# Patient Record
Sex: Male | Born: 1955
Health system: Southern US, Community
[De-identification: ages and names within clinical notes are randomized; demographics above are authoritative.]

## PROBLEM LIST (undated history)

## (undated) ENCOUNTER — Inpatient Hospital Stay (HOSPITAL_COMMUNITY): Payer: Self-pay | Admitting: Obstetrics & Gynecology

## (undated) DIAGNOSIS — I10 Essential (primary) hypertension: Secondary | ICD-10-CM

## (undated) DIAGNOSIS — F0390 Unspecified dementia without behavioral disturbance: Secondary | ICD-10-CM

## (undated) HISTORY — PX: CHOLECYSTECTOMY: SHX55

## (undated) HISTORY — PX: FRACTURE SURGERY: SHX138

---

## 2002-11-09 ENCOUNTER — Encounter: Admission: RE | Admit: 2002-11-09 | Discharge: 2002-11-09 | Payer: Self-pay | Admitting: Family Medicine

## 2002-11-09 ENCOUNTER — Encounter: Payer: Self-pay | Admitting: Family Medicine

## 2005-06-16 DIAGNOSIS — I10 Essential (primary) hypertension: Secondary | ICD-10-CM

## 2005-06-16 HISTORY — DX: Essential (primary) hypertension: I10

## 2009-06-16 HISTORY — PX: OTHER SURGICAL HISTORY: SHX169

## 2010-04-10 ENCOUNTER — Encounter
Admission: RE | Admit: 2010-04-10 | Discharge: 2010-06-04 | Payer: Self-pay | Source: Home / Self Care | Attending: Orthopedic Surgery | Admitting: Orthopedic Surgery

## 2010-04-27 ENCOUNTER — Emergency Department (HOSPITAL_COMMUNITY): Admission: EM | Admit: 2010-04-27 | Discharge: 2010-04-27 | Payer: Self-pay | Admitting: Emergency Medicine

## 2010-06-27 ENCOUNTER — Encounter
Admission: RE | Admit: 2010-06-27 | Discharge: 2010-07-16 | Payer: Self-pay | Source: Home / Self Care | Attending: Orthopedic Surgery | Admitting: Orthopedic Surgery

## 2010-07-17 ENCOUNTER — Ambulatory Visit: Payer: Self-pay | Attending: Orthopedic Surgery | Admitting: Physical Therapy

## 2010-07-17 DIAGNOSIS — IMO0001 Reserved for inherently not codable concepts without codable children: Secondary | ICD-10-CM | POA: Insufficient documentation

## 2010-07-17 DIAGNOSIS — R262 Difficulty in walking, not elsewhere classified: Secondary | ICD-10-CM | POA: Insufficient documentation

## 2010-07-17 DIAGNOSIS — M2550 Pain in unspecified joint: Secondary | ICD-10-CM | POA: Insufficient documentation

## 2010-07-17 DIAGNOSIS — M6281 Muscle weakness (generalized): Secondary | ICD-10-CM | POA: Insufficient documentation

## 2010-07-17 DIAGNOSIS — M2560 Stiffness of unspecified joint, not elsewhere classified: Secondary | ICD-10-CM | POA: Insufficient documentation

## 2010-07-18 ENCOUNTER — Ambulatory Visit: Payer: Self-pay | Admitting: Physical Therapy

## 2010-07-24 ENCOUNTER — Ambulatory Visit: Payer: Self-pay | Admitting: Physical Therapy

## 2010-07-25 ENCOUNTER — Ambulatory Visit: Payer: Self-pay | Admitting: Physical Therapy

## 2010-07-29 ENCOUNTER — Encounter: Payer: Self-pay | Admitting: Physical Therapy

## 2010-07-31 ENCOUNTER — Ambulatory Visit: Payer: Self-pay | Admitting: Physical Therapy

## 2010-08-05 ENCOUNTER — Ambulatory Visit: Payer: Self-pay | Admitting: Physical Therapy

## 2010-08-08 ENCOUNTER — Ambulatory Visit: Payer: Self-pay | Admitting: Physical Therapy

## 2010-08-14 ENCOUNTER — Ambulatory Visit: Payer: Self-pay | Admitting: Physical Therapy

## 2010-08-16 ENCOUNTER — Ambulatory Visit: Payer: Self-pay | Attending: Orthopedic Surgery | Admitting: Physical Therapy

## 2010-08-16 DIAGNOSIS — M6281 Muscle weakness (generalized): Secondary | ICD-10-CM | POA: Insufficient documentation

## 2010-08-16 DIAGNOSIS — M2550 Pain in unspecified joint: Secondary | ICD-10-CM | POA: Insufficient documentation

## 2010-08-16 DIAGNOSIS — R262 Difficulty in walking, not elsewhere classified: Secondary | ICD-10-CM | POA: Insufficient documentation

## 2010-08-16 DIAGNOSIS — IMO0001 Reserved for inherently not codable concepts without codable children: Secondary | ICD-10-CM | POA: Insufficient documentation

## 2010-08-16 DIAGNOSIS — M2560 Stiffness of unspecified joint, not elsewhere classified: Secondary | ICD-10-CM | POA: Insufficient documentation

## 2010-08-19 ENCOUNTER — Encounter: Payer: Self-pay | Admitting: Physical Therapy

## 2010-08-21 ENCOUNTER — Encounter: Payer: Self-pay | Admitting: Physical Therapy

## 2010-08-27 ENCOUNTER — Ambulatory Visit: Payer: Self-pay | Admitting: Physical Therapy

## 2010-08-27 LAB — DIFFERENTIAL
Lymphocytes Relative: 7 % — ABNORMAL LOW (ref 12–46)
Lymphs Abs: 1.2 10*3/uL (ref 0.7–4.0)
Monocytes Relative: 8 % (ref 3–12)
Neutro Abs: 14.2 10*3/uL — ABNORMAL HIGH (ref 1.7–7.7)
Neutrophils Relative %: 85 % — ABNORMAL HIGH (ref 43–77)

## 2010-08-27 LAB — BASIC METABOLIC PANEL
Calcium: 9.8 mg/dL (ref 8.4–10.5)
GFR calc Af Amer: >60 mL/min (ref >60)
GFR calc non Af Amer: >60 mL/min (ref >60)
Potassium: 4.3 mEq/L (ref 3.5–5.1)
Sodium: 137 mEq/L (ref 135–145)

## 2010-08-27 LAB — CBC
HCT: 38.7 % — ABNORMAL LOW (ref 39.0–52.0)
Hemoglobin: 12.9 g/dL — ABNORMAL LOW (ref 13.0–17.0)
RBC: 4.47 MIL/uL (ref 4.22–5.81)

## 2010-09-03 ENCOUNTER — Ambulatory Visit: Payer: Self-pay | Admitting: Physical Therapy

## 2010-09-10 ENCOUNTER — Ambulatory Visit: Payer: Self-pay | Admitting: Physical Therapy

## 2010-09-12 ENCOUNTER — Encounter: Payer: Self-pay | Admitting: Physical Therapy

## 2010-09-16 ENCOUNTER — Ambulatory Visit: Payer: Self-pay | Attending: Orthopedic Surgery | Admitting: Physical Therapy

## 2010-09-16 DIAGNOSIS — M2550 Pain in unspecified joint: Secondary | ICD-10-CM | POA: Insufficient documentation

## 2010-09-16 DIAGNOSIS — R262 Difficulty in walking, not elsewhere classified: Secondary | ICD-10-CM | POA: Insufficient documentation

## 2010-09-16 DIAGNOSIS — M2560 Stiffness of unspecified joint, not elsewhere classified: Secondary | ICD-10-CM | POA: Insufficient documentation

## 2010-09-16 DIAGNOSIS — IMO0001 Reserved for inherently not codable concepts without codable children: Secondary | ICD-10-CM | POA: Insufficient documentation

## 2010-09-16 DIAGNOSIS — M6281 Muscle weakness (generalized): Secondary | ICD-10-CM | POA: Insufficient documentation

## 2010-09-18 ENCOUNTER — Ambulatory Visit: Payer: Self-pay | Admitting: Physical Therapy

## 2011-01-09 MED ORDER — KETOROLAC TROMETHAMINE 30 MG/ML IJ SOLN
INTRAMUSCULAR | Status: AC
  Filled 2011-01-09: qty 1

## 2011-01-13 MED ORDER — MUPIROCIN 2 % EX OINT
TOPICAL_OINTMENT | CUTANEOUS | Status: AC
  Filled 2011-01-13: qty 22

## 2011-01-31 MED ORDER — METOCLOPRAMIDE HCL 10 MG PO TABS
ORAL_TABLET | ORAL | Status: AC
  Filled 2011-01-31: qty 1

## 2011-07-15 MED ORDER — MUPIROCIN 2 % EX OINT
TOPICAL_OINTMENT | CUTANEOUS | Status: AC
  Filled 2011-07-15: qty 22

## 2014-06-08 ENCOUNTER — Emergency Department (HOSPITAL_COMMUNITY): Payer: Self-pay

## 2014-06-08 ENCOUNTER — Emergency Department (HOSPITAL_COMMUNITY)
Admission: EM | Admit: 2014-06-08 | Discharge: 2014-06-08 | Disposition: A | Discharge status: Home / Self Care | Payer: Self-pay | Attending: Emergency Medicine | Admitting: Emergency Medicine

## 2014-06-08 ENCOUNTER — Encounter (HOSPITAL_COMMUNITY): Payer: Self-pay | Admitting: Emergency Medicine

## 2014-06-08 DIAGNOSIS — M25431 Effusion, right wrist: Secondary | ICD-10-CM | POA: Insufficient documentation

## 2014-06-08 LAB — CBC WITH DIFFERENTIAL/PLATELET
BASOS ABS: 0 10*3/uL (ref 0.0–0.1)
BASOS PCT: 0 % (ref 0–1)
Eosinophils Absolute: 0.1 10*3/uL (ref 0.0–0.7)
Eosinophils Relative: 2 % (ref 0–5)
HCT: 40.5 % (ref 39.0–52.0)
Hemoglobin: 13.6 g/dL (ref 13.0–17.0)
LYMPHS PCT: 25 % (ref 12–46)
Lymphs Abs: 1.3 10*3/uL (ref 0.7–4.0)
MCH: 31.3 pg (ref 26.0–34.0)
MCHC: 33.6 g/dL (ref 30.0–36.0)
MCV: 93.3 fL (ref 78.0–100.0)
Monocytes Absolute: 0.5 10*3/uL (ref 0.1–1.0)
Monocytes Relative: 10 % (ref 3–12)
NEUTROS ABS: 3.5 10*3/uL (ref 1.7–7.7)
Neutrophils Relative %: 63 % (ref 43–77)
PLATELETS: 237 10*3/uL (ref 150–400)
RBC: 4.34 MIL/uL (ref 4.22–5.81)
RDW: 13.4 % (ref 11.5–15.5)
WBC: 5.5 10*3/uL (ref 4.0–10.5)

## 2014-06-08 LAB — BASIC METABOLIC PANEL
ANION GAP: 6 (ref 5–15)
BUN: 11 mg/dL (ref 6–23)
CALCIUM: 9.2 mg/dL (ref 8.4–10.5)
CHLORIDE: 107 meq/L (ref 96–112)
CO2: 27 mmol/L (ref 19–32)
CREATININE: 1 mg/dL (ref 0.50–1.35)
GFR calc non Af Amer: 80 mL/min — ABNORMAL LOW (ref >90)
Glucose, Bld: 106 mg/dL — ABNORMAL HIGH (ref 70–99)
Potassium: 4.5 mmol/L (ref 3.5–5.1)
SODIUM: 140 mmol/L (ref 135–145)

## 2014-06-08 MED ORDER — OXYCODONE HCL 5 MG PO TABS
5.0000 mg | ORAL_TABLET | ORAL | Status: DC | PRN
Start: 2014-06-08 — End: 2015-01-29

## 2014-06-08 MED ORDER — ACETAMINOPHEN 325 MG PO TABS
650.0000 mg | ORAL_TABLET | Freq: Once | ORAL | Status: AC
  Administered 2014-06-08: 650 mg via ORAL
  Filled 2014-06-08: qty 2

## 2014-06-08 MED ORDER — NAPROXEN 500 MG PO TABS: 500.0000 mg | ORAL_TABLET | Freq: Two times a day (BID) | ORAL | Status: DC

## 2014-06-08 NOTE — ED Notes (Signed)
Pt returned from xray

## 2014-06-08 NOTE — ED Provider Notes (Signed)
CSN: [id number]     Arrival date & time 06/08/14  [id number] History   First MD Initiated Contact with Patient 06/08/14 [phone]     Chief Complaint  Patient presents with  . Arm Pain     (Consider location/radiation/quality/duration/timing/severity/associated sxs/prior Treatment) Patient is a 58 y.o. male presenting with arm pain. The history is provided by the patient.  Arm Pain This is a new problem. Episode onset: 4 days ago. The problem occurs constantly. The problem has not changed since onset.Pertinent negatives include no chest pain, no abdominal pain, no headaches and no shortness of breath. Exacerbated by: right wrist movement. Nothing relieves the symptoms. He has tried acetaminophen for the symptoms. The treatment provided mild relief.    History reviewed. No pertinent past medical history. History reviewed. No pertinent past surgical history. History reviewed. No pertinent family history. History  Substance Use Topics  . Smoking status: Never Smoker   . Smokeless tobacco: Not on file  . Alcohol Use: No    Review of Systems  Constitutional: Negative for fever.  HENT: Negative for drooling and rhinorrhea.   Eyes: Negative for pain.  Respiratory: Negative for cough and shortness of breath.   Cardiovascular: Negative for chest pain and leg swelling.  Gastrointestinal: Negative for nausea, vomiting, abdominal pain and diarrhea.  Genitourinary: Negative for dysuria and hematuria.  Musculoskeletal: Positive for myalgias and joint swelling. Negative for gait problem and neck pain.  Skin: Negative for color change.  Neurological: Negative for numbness and headaches.  Hematological: Negative for adenopathy.  Psychiatric/Behavioral: Negative for behavioral problems.  All other systems reviewed and are negative.     Allergies  Review of patient's allergies indicates no known allergies.  Home Medications   Prior to Admission medications   Not on File   BP 166/99 mmHg  Pulse  68  Temp(Src) 98 F (36.7 C) (Oral)  Resp 18  SpO2 99% Physical Exam  Constitutional: He is oriented to person, place, and time. He appears well-developed and well-nourished.  HENT:  Head: Normocephalic and atraumatic.  Right Ear: External ear normal.  Left Ear: External ear normal.  Nose: Nose normal.  Mouth/Throat: Oropharynx is clear and moist. No oropharyngeal exudate.  Eyes: Conjunctivae and EOM are normal. Pupils are equal, round, and reactive to light.  Neck: Normal range of motion. Neck supple.  Cardiovascular: Normal rate, regular rhythm, normal heart sounds and intact distal pulses.  Exam reveals no gallop and no friction rub.   No murmur heard. Pulmonary/Chest: Effort normal and breath sounds normal. No respiratory distress. He has no wheezes.  Abdominal: Soft. Bowel sounds are normal. He exhibits no distension. There is no tenderness. There is no rebound and no guarding.  Musculoskeletal: Normal range of motion. He exhibits tenderness. He exhibits no edema.  Diffuse tenderness of the right wrist which is circumferentially swollen.  Mildly limited flexion of the fingers of the right hand due to pain.  Faint erythema noted on the dorsal surface of the right wrist.  The patient has lateral tenderness to palpation of the right elbow. Normal range of motion of the right elbow which is otherwise unremarkable.  2+ distal pulses in the upper extremities. Normal sensation in the upper extremities.  Neurological: He is alert and oriented to person, place, and time.  Skin: Skin is warm and dry.  Psychiatric: He has a normal mood and affect. His behavior is normal.  Nursing note and vitals reviewed.   ED Course  Procedures (including critical care time) Labs  Review Labs Reviewed  BASIC METABOLIC PANEL - Abnormal; Notable for the following:    Glucose, Bld 106 (*)    GFR calc non Af Amer 80 (*)    All other components within normal limits  CBC WITH DIFFERENTIAL    Imaging  Review Dg Wrist Complete Right  06/08/2014   CLINICAL DATA:  Right wrist swelling, no known injury  EXAM: RIGHT WRIST - COMPLETE 3+ VIEW  COMPARISON:  None.  FINDINGS: Four views of the right wrist submitted. No acute fracture or subluxation. There is narrowing of radiocarpal joint space. Small dystrophic calcifications are noted just anterior to distal ulna. Findings are suspicious for chondrocalcinosis. Atherosclerotic vascular calcifications are noted.  IMPRESSION: No acute fracture or subluxation. Narrowing of radiocarpal joint space. 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 Atherosclerotic vascular calcifications are noted.   Electronically Signed   By: Natasha MeadLiviu  Pop M.D.   On: 06/08/2014 10:07     EKG Interpretation None      MDM   Final diagnoses:  Wrist swelling, right    9:36 AM 58 y.o. male w hx of gout who presents with right wrist pain and swelling which began about 4 days ago. His symptoms feel similar to gout that he has had in his feet before. He denies any fevers. He states that he began having some mild right elbow pain as well last night. He is afebrile and vital signs are unremarkable here. Circumferential swelling to the right wrist with very faint erythema noted on the dorsal surface. Will get screening lab work and plain film.  Lab work unremarkable. 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 I discussed the diagnosis with the patient and felt that this is likely related to gout or pseudogout. I did tell him that I could not definitively rule out an infected joint without arthrocentesis but felt like it was also reasonable to treat him symptomatically and have him return for any worsening. He preferred symptomatic treatment and will return for any worsening.  11:59 AM:  I have discussed the diagnosis/risks/treatment options with the patient and believe the pt to be eligible for discharge home to follow-up with his pcp  as needed. We also discussed returning to the ED immediately if new or worsening sx occur. We discussed the sx which are most concerning (e.g., worsening pain, fever, chills, spreading redness, inc swelling) that necessitate immediate return. Medications administered to the patient during their visit and any new prescriptions provided to the patient are listed below.  Medications given during this visit Medications  acetaminophen (TYLENOL) tablet 650 mg (650 mg Oral Given 06/08/14 1128)    New Prescriptions   NAPROXEN (NAPROSYN) 500 MG TABLET    Take 1 tablet (500 mg total) by mouth 2 (two) times daily with a meal.   OXYCODONE (ROXICODONE) 5 MG IMMEDIATE RELEASE TABLET    Take 1 tablet (5 mg total) by mouth every 4 (four) hours as needed for severe pain.     Purvis SheffieldForrest Lorna Strother, MD 06/08/14 [phone]

## 2014-06-08 NOTE — Discharge Instructions (Signed)
Return to the ER for any worsening pain, increased swelling, increased redness or spreading redness, fever, body aches, or chills.

## 2014-06-08 NOTE — ED Notes (Signed)
Pt c/o right arm pain and swelling x 3 days

## 2014-06-08 NOTE — ED Notes (Signed)
Pt to xray

## 2015-01-22 ENCOUNTER — Encounter (HOSPITAL_COMMUNITY): Payer: Self-pay | Admitting: Emergency Medicine

## 2015-01-22 ENCOUNTER — Emergency Department (HOSPITAL_COMMUNITY)
Admission: EM | Admit: 2015-01-22 | Discharge: 2015-01-22 | Disposition: A | Discharge status: Home / Self Care | Payer: Self-pay | Attending: Emergency Medicine | Admitting: Emergency Medicine

## 2015-01-22 ENCOUNTER — Emergency Department (HOSPITAL_COMMUNITY): Payer: Self-pay

## 2015-01-22 DIAGNOSIS — R42 Dizziness and giddiness: Secondary | ICD-10-CM | POA: Insufficient documentation

## 2015-01-22 DIAGNOSIS — R112 Nausea with vomiting, unspecified: Secondary | ICD-10-CM

## 2015-01-22 DIAGNOSIS — K8020 Calculus of gallbladder without cholecystitis without obstruction: Secondary | ICD-10-CM | POA: Insufficient documentation

## 2015-01-22 DIAGNOSIS — R1013 Epigastric pain: Secondary | ICD-10-CM

## 2015-01-22 DIAGNOSIS — I10 Essential (primary) hypertension: Secondary | ICD-10-CM | POA: Insufficient documentation

## 2015-01-22 DIAGNOSIS — Z791 Long term (current) use of non-steroidal anti-inflammatories (NSAID): Secondary | ICD-10-CM | POA: Insufficient documentation

## 2015-01-22 HISTORY — DX: Essential (primary) hypertension: I10

## 2015-01-22 LAB — COMPREHENSIVE METABOLIC PANEL
ALBUMIN: 3.8 g/dL (ref 3.5–5.0)
ALK PHOS: 85 U/L (ref 38–126)
ALT: 11 U/L — ABNORMAL LOW (ref 17–63)
AST: 14 U/L — AB (ref 15–41)
Anion gap: 11 (ref 5–15)
BILIRUBIN TOTAL: 0.4 mg/dL (ref 0.3–1.2)
BUN: 12 mg/dL (ref 6–20)
CALCIUM: 8.9 mg/dL (ref 8.9–10.3)
CHLORIDE: 105 mmol/L (ref 101–111)
CO2: 22 mmol/L (ref 22–32)
CREATININE: 1.01 mg/dL (ref 0.61–1.24)
GFR calc non Af Amer: >60 mL/min (ref >60)
GLUCOSE: 142 mg/dL — AB (ref 65–99)
Potassium: 3.5 mmol/L (ref 3.5–5.1)
SODIUM: 138 mmol/L (ref 135–145)
TOTAL PROTEIN: 7.7 g/dL (ref 6.5–8.1)

## 2015-01-22 LAB — URINALYSIS, ROUTINE W REFLEX MICROSCOPIC
Bilirubin Urine: NEGATIVE
Glucose, UA: NEGATIVE mg/dL
KETONES UR: 15 mg/dL — AB
Leukocytes, UA: NEGATIVE
Nitrite: NEGATIVE
Protein, ur: 30 mg/dL — AB
Specific Gravity, Urine: 1.022 (ref 1.005–1.030)
UROBILINOGEN UA: 0.2 mg/dL (ref 0.0–1.0)
pH: 5.5 (ref 5.0–8.0)

## 2015-01-22 LAB — CBC
HCT: 39.7 % (ref 39.0–52.0)
HEMOGLOBIN: 13.4 g/dL (ref 13.0–17.0)
MCH: 31.2 pg (ref 26.0–34.0)
MCHC: 33.8 g/dL (ref 30.0–36.0)
MCV: 92.3 fL (ref 78.0–100.0)
Platelets: 303 10*3/uL (ref 150–400)
RBC: 4.3 MIL/uL (ref 4.22–5.81)
RDW: 13.3 % (ref 11.5–15.5)
WBC: 10 10*3/uL (ref 4.0–10.5)

## 2015-01-22 LAB — URINE MICROSCOPIC-ADD ON

## 2015-01-22 LAB — TROPONIN I: Troponin I: <0.03 ng/mL (ref <0.031)

## 2015-01-22 LAB — LIPASE, BLOOD: Lipase: 23 U/L (ref 22–51)

## 2015-01-22 MED ORDER — ONDANSETRON 4 MG PO TBDP: 4.0000 mg | ORAL_TABLET | Freq: Three times a day (TID) | ORAL | Status: DC | PRN

## 2015-01-22 MED ORDER — ONDANSETRON HCL 4 MG/2ML IJ SOLN
4.0000 mg | Freq: Once | INTRAMUSCULAR | Status: AC | PRN
  Administered 2015-01-22: 4 mg via INTRAVENOUS
  Filled 2015-01-22: qty 2

## 2015-01-22 MED ORDER — MORPHINE SULFATE 4 MG/ML IJ SOLN
4.0000 mg | Freq: Once | INTRAMUSCULAR | Status: AC
  Administered 2015-01-22: 4 mg via INTRAVENOUS
  Filled 2015-01-22: qty 1

## 2015-01-22 MED ORDER — FENTANYL CITRATE (PF) 100 MCG/2ML IJ SOLN
50.0000 ug | Freq: Once | INTRAMUSCULAR | Status: AC
  Administered 2015-01-22: 50 ug via INTRAVENOUS
  Filled 2015-01-22: qty 2

## 2015-01-22 MED ORDER — METOCLOPRAMIDE HCL 5 MG/ML IJ SOLN
10.0000 mg | INTRAMUSCULAR | Status: AC
  Administered 2015-01-22: 10 mg via INTRAVENOUS
  Filled 2015-01-22: qty 2

## 2015-01-22 MED ORDER — OXYCODONE-ACETAMINOPHEN 5-325 MG PO TABS: 1.0000 | ORAL_TABLET | ORAL | Status: DC | PRN

## 2015-01-22 NOTE — ED Provider Notes (Signed)
CSN: [id number]     Arrival date & time 01/22/15  0549 History   First MD Initiated Contact with Patient 01/22/15 0559     Chief Complaint  Patient presents with  . Abdominal Pain  . Nausea    vomiting     (Consider location/radiation/quality/duration/timing/severity/associated sxs/prior Treatment) Patient is a 59 y.o. male presenting with abdominal pain. The history is provided by the patient and medical records.  Abdominal Pain Associated symptoms: nausea and vomiting     This is a 59 year old male here with epigastric abdominal pain with nausea and vomiting which began yesterday. He states initially symptoms were mild, but they have progressively worsened since this time. Emesis has been nonbloody, nonbilious. He is not been able to eat/drink very much since symptoms began.  He denies fever, chills.  No flank pain, hematuria, difficulty urinating, or change in bowel habits.  No melena or hematochezia.  Patient states he feels intermittently lightheaded, worse after vomiting.  No dizziness, focal weakness, visual disturbance, numbness, paresthesias, slurred speech, or confusion. Patient has no prior history of abdominal surgeries. No intervention tried prior to arrival.  Past Medical History  Diagnosis Date  . Hypertension    Past Surgical History  Procedure Laterality Date  . Bilateral foot sx     No family history on file. History  Substance Use Topics  . Smoking status: Never Smoker   . Smokeless tobacco: Not on file  . Alcohol Use: 1.2 oz/week    2 Cans of beer per week     Comment: occasionally    Review of Systems  Gastrointestinal: Positive for nausea, vomiting and abdominal pain.  All other systems reviewed and are negative.     Allergies  Review of patient's allergies indicates no known allergies.  Home Medications   Prior to Admission medications   Medication Sig Start Date End Date Taking? Authorizing Provider  acetaminophen (TYLENOL) 500 MG tablet Take  500 mg by mouth every 6 (six) hours as needed (pain).    Historical Provider, MD  naproxen (NAPROSYN) 500 MG tablet Take 1 tablet (500 mg total) by mouth 2 (two) times daily with a meal. 06/08/14   Purvis Sheffield, MD  oxyCODONE (ROXICODONE) 5 MG immediate release tablet Take 1 tablet (5 mg total) by mouth every 4 (four) hours as needed for severe pain. 06/08/14   Purvis Sheffield, MD   BP 168/98 mmHg  Pulse 74  Temp(Src) 98.8 F (37.1 C) (Oral)  Resp 18  Ht  (1.651 m)  Wt 165 lb (74.844 kg)  BMI 27.46 kg/m2  SpO2 99%   Physical Exam  Constitutional: He is oriented to person, place, and time. He appears well-developed and well-nourished. No distress.  HENT:  Head: Normocephalic and atraumatic.  Mouth/Throat: Oropharynx is clear and moist.  Eyes: Conjunctivae and EOM are normal. Pupils are equal, round, and reactive to light.  Neck: Normal range of motion. Neck supple.  Cardiovascular: Normal rate, regular rhythm and normal heart sounds.   Pulmonary/Chest: Effort normal and breath sounds normal. No respiratory distress. He has no wheezes.  Abdominal: Soft. Bowel sounds are normal. There is tenderness in the right upper quadrant and epigastric area. There is no CVA tenderness and no tenderness at McBurney's point.  Musculoskeletal: Normal range of motion.  Neurological: He is alert and oriented to person, place, and time.  AAOx3, answering questions appropriately; equal strength UE and LE bilaterally; CN grossly intact; moves all extremities appropriately without ataxia; no focal neuro deficits or facial  asymmetry appreciated  Skin: Skin is warm and dry. He is not diaphoretic.  Psychiatric: He has a normal mood and affect.  Nursing note and vitals reviewed.   ED Course  Procedures (including critical care time) Labs Review Labs Reviewed  COMPREHENSIVE METABOLIC PANEL - Abnormal; Notable for the following:    Glucose, Bld 142 (*)    AST 14 (*)    ALT 11 (*)    All other  components within normal limits  URINALYSIS, ROUTINE W REFLEX MICROSCOPIC (NOT AT [facility]) - Abnormal; Notable for the following:    Hgb urine dipstick TRACE (*)    Ketones, ur 15 (*)    Protein, ur 30 (*)    All other components within normal limits  URINE MICROSCOPIC-ADD ON - Abnormal; Notable for the following:    Casts HYALINE CASTS (*)    All other components within normal limits  LIPASE, BLOOD  CBC  TROPONIN I    Imaging Review US Abdomen Complete  01/22/2015   CLINICAL DATA:  Onset of nausea vomiting and epigastric pain yesterday  EXAM: ULTRASOUND ABDOMEN COMPLETE  COMPARISON:  None in PACs  FINDINGS: Gallbladder: The gallbladder is adequately distended. There is a 3.7 cm stone as well as sludge present. There is no sonographic Murphy's sign.  Common bile duct: Diameter: 4.8 mm  Liver: The hepatic echotexture is normal. There is no intrahepatic ductal dilation.  IVC: No abnormality visualized.  Pancreas: The visualized portions of the pancreas are unremarkable but the tail and portions of the head are obscured by bowel gas.  Spleen: Size and appearance within normal limits.  Right Kidney: Length: 10.6 cm. Echogenicity within normal limits. No mass or hydronephrosis visualized.  Left Kidney: Length: 11.2 cm. Echogenicity within normal limits. No mass or hydronephrosis visualized.  Abdominal aorta: No aneurysm visualized.  Other findings: None.  IMPRESSION: Gallstones without sonographic evidence of acute cholecystitis. Limited evaluation of the pancreas. No other intra-abdominal abnormality is observed.   Electronically Signed   By: David  [place] M.D.   On: 01/22/2015 07:55     EKG Interpretation   Date/Time:  Monday January 22 2015 06:15:42 EDT Ventricular Rate:  70 PR Interval:  137 QRS Duration: 91 QT Interval:  393 QTC Calculation: 424 R Axis:   61 Text Interpretation:  Sinus rhythm Normal ECG Confirmed by DELO  MD,  DOUGLAS ([id number]) on 01/22/2015 6:18:54 AM      MDM   Final  diagnoses:  Epigastric pain  Gallstones  Nausea and vomiting, vomiting of unspecified type   59 year old male here with epigastric abdominal pain, nausea, and vomiting. No reported chest pain or shortness of breath.  Patient afebrile, nontoxic. He has tenderness of his epigastrium and right upper quadrant. No known history of gallstones. Will obtain labs and abdominal ultrasound.  Patient given IV fentanyl and Zofran.  Labwork as above, no leukocytosis. LFTs, bili, and alkaline phosphatase all within normal limits. UA with trace blood, no signs of infection.  Abdominal ultrasound revealing gallstones without evidence of acute cholecystitis. This is likely the source of patient's pain. He was given additional morphine and Reglan with significant improvement of his symptoms. He has tolerated PO fluids without difficulty.  No active vomiting here in ED. Patient will be discharged home with symptomatic care. He was given follow-up with general surgery.  Discussed plan with patient, he/she acknowledged understanding and agreed with plan of care.  Return precautions given for new or worsening symptoms.   Garlon Hatchet, PA-C 01/22/15 1120  Geoffery Lyons, MD 01/25/15 [phone]

## 2015-01-22 NOTE — ED Notes (Signed)
Patient transported to Ultrasound with transporter  

## 2015-01-22 NOTE — ED Notes (Signed)
Patient was given a cup of ice water. 

## 2015-01-22 NOTE — ED Notes (Signed)
Reports having abdominal pain with nausea and vomiting that started yesterday.  Also reports dizziness that started about the same time.

## 2015-01-22 NOTE — Discharge Instructions (Signed)
Take the prescribed medication as directed. Follow-up with [facility] [place] [facility]-- call to make appt. Return to the ED for new or worsening symptoms.

## 2015-01-22 NOTE — ED Notes (Signed)
Pt at U/S

## 2015-01-22 NOTE — ED Notes (Signed)
Pt able to tolerate water without increased abdominal pain or nausea.

## 2015-01-29 ENCOUNTER — Encounter (HOSPITAL_COMMUNITY): Payer: Self-pay | Admitting: *Deleted

## 2015-01-29 ENCOUNTER — Emergency Department (HOSPITAL_COMMUNITY): Admission: EM | Admit: 2015-01-29 | Discharge: 2015-01-29 | Discharge status: Absent without leave | Payer: Self-pay

## 2015-01-29 ENCOUNTER — Telehealth: Payer: Self-pay | Admitting: *Deleted

## 2015-01-29 ENCOUNTER — Inpatient Hospital Stay (HOSPITAL_COMMUNITY)
Admission: EM | Admit: 2015-01-29 | Discharge: 2015-01-31 | DRG: 419 | Disposition: A | Discharge status: Home / Self Care | Payer: Self-pay | Attending: Surgery | Admitting: Surgery

## 2015-01-29 ENCOUNTER — Emergency Department (HOSPITAL_COMMUNITY): Payer: Self-pay

## 2015-01-29 DIAGNOSIS — K811 Chronic cholecystitis: Secondary | ICD-10-CM

## 2015-01-29 DIAGNOSIS — K810 Acute cholecystitis: Secondary | ICD-10-CM | POA: Diagnosis present

## 2015-01-29 DIAGNOSIS — K819 Cholecystitis, unspecified: Secondary | ICD-10-CM

## 2015-01-29 DIAGNOSIS — K8012 Calculus of gallbladder with acute and chronic cholecystitis without obstruction: Principal | ICD-10-CM | POA: Diagnosis present

## 2015-01-29 DIAGNOSIS — K8010 Calculus of gallbladder with chronic cholecystitis without obstruction: Secondary | ICD-10-CM | POA: Diagnosis present

## 2015-01-29 DIAGNOSIS — I10 Essential (primary) hypertension: Secondary | ICD-10-CM | POA: Diagnosis present

## 2015-01-29 LAB — COMPREHENSIVE METABOLIC PANEL
ALBUMIN: 3.7 g/dL (ref 3.5–5.0)
ALT: 26 U/L (ref 17–63)
AST: 22 U/L (ref 15–41)
Alkaline Phosphatase: 108 U/L (ref 38–126)
Anion gap: 9 (ref 5–15)
BILIRUBIN TOTAL: 0.3 mg/dL (ref 0.3–1.2)
BUN: 10 mg/dL (ref 6–20)
CO2: 24 mmol/L (ref 22–32)
Calcium: 9.5 mg/dL (ref 8.9–10.3)
Chloride: 104 mmol/L (ref 101–111)
Creatinine, Ser: 0.96 mg/dL (ref 0.61–1.24)
GFR calc Af Amer: >60 mL/min (ref >60)
GFR calc non Af Amer: >60 mL/min (ref >60)
GLUCOSE: 109 mg/dL — AB (ref 65–99)
POTASSIUM: 4.3 mmol/L (ref 3.5–5.1)
Sodium: 137 mmol/L (ref 135–145)
Total Protein: 8.8 g/dL — ABNORMAL HIGH (ref 6.5–8.1)

## 2015-01-29 LAB — URINALYSIS, ROUTINE W REFLEX MICROSCOPIC
Bilirubin Urine: NEGATIVE
GLUCOSE, UA: NEGATIVE mg/dL
HGB URINE DIPSTICK: NEGATIVE
Ketones, ur: NEGATIVE mg/dL
Leukocytes, UA: NEGATIVE
Nitrite: NEGATIVE
PH: 5 (ref 5.0–8.0)
Protein, ur: NEGATIVE mg/dL
SPECIFIC GRAVITY, URINE: 1.007 (ref 1.005–1.030)
Urobilinogen, UA: 0.2 mg/dL (ref 0.0–1.0)

## 2015-01-29 LAB — CBC
HEMATOCRIT: 38.3 % — AB (ref 39.0–52.0)
Hemoglobin: 12.9 g/dL — ABNORMAL LOW (ref 13.0–17.0)
MCH: 31.5 pg (ref 26.0–34.0)
MCHC: 33.7 g/dL (ref 30.0–36.0)
MCV: 93.6 fL (ref 78.0–100.0)
Platelets: 430 10*3/uL — ABNORMAL HIGH (ref 150–400)
RBC: 4.09 MIL/uL — ABNORMAL LOW (ref 4.22–5.81)
RDW: 13.4 % (ref 11.5–15.5)
WBC: 10.4 10*3/uL (ref 4.0–10.5)

## 2015-01-29 LAB — LIPASE, BLOOD: Lipase: 25 U/L (ref 22–51)

## 2015-01-29 MED ORDER — PANTOPRAZOLE SODIUM 40 MG IV SOLR
40.0000 mg | Freq: Every day | INTRAVENOUS | Status: DC
  Administered 2015-01-30 (×2): 40 mg via INTRAVENOUS
  Filled 2015-01-29: qty 40

## 2015-01-29 MED ORDER — OXYCODONE-ACETAMINOPHEN 5-325 MG PO TABS
2.0000 | ORAL_TABLET | Freq: Once | ORAL | Status: AC
  Administered 2015-01-29: 2 via ORAL
  Filled 2015-01-29: qty 2

## 2015-01-29 MED ORDER — MORPHINE SULFATE (PF) 2 MG/ML IV SOLN
1.0000 mg | INTRAVENOUS | Status: DC | PRN
  Administered 2015-01-30 (×2): 2 mg via INTRAVENOUS
  Filled 2015-01-29 (×2): qty 1

## 2015-01-29 MED ORDER — ACETAMINOPHEN 325 MG PO TABS: 650.0000 mg | ORAL_TABLET | Freq: Four times a day (QID) | ORAL | Status: DC | PRN

## 2015-01-29 MED ORDER — DIPHENHYDRAMINE HCL 50 MG/ML IJ SOLN: 12.5000 mg | Freq: Four times a day (QID) | INTRAMUSCULAR | Status: DC | PRN

## 2015-01-29 MED ORDER — ACETAMINOPHEN 650 MG RE SUPP: 650.0000 mg | Freq: Four times a day (QID) | RECTAL | Status: DC | PRN

## 2015-01-29 MED ORDER — DIPHENHYDRAMINE HCL 12.5 MG/5ML PO ELIX: 12.5000 mg | ORAL_SOLUTION | Freq: Four times a day (QID) | ORAL | Status: DC | PRN

## 2015-01-29 MED ORDER — ONDANSETRON 4 MG PO TBDP: 4.0000 mg | ORAL_TABLET | Freq: Four times a day (QID) | ORAL | Status: DC | PRN

## 2015-01-29 MED ORDER — CEFTRIAXONE SODIUM 2 G IJ SOLR
2.0000 g | Freq: Every day | INTRAMUSCULAR | Status: DC
  Administered 2015-01-30 (×2): 2 g via INTRAVENOUS
  Filled 2015-01-29 (×3): qty 2

## 2015-01-29 MED ORDER — ONDANSETRON HCL 4 MG/2ML IJ SOLN: 4.0000 mg | Freq: Four times a day (QID) | INTRAMUSCULAR | Status: DC | PRN

## 2015-01-29 MED ORDER — ONDANSETRON 4 MG PO TBDP
4.0000 mg | ORAL_TABLET | Freq: Once | ORAL | Status: AC
  Administered 2015-01-29: 4 mg via ORAL
  Filled 2015-01-29: qty 1

## 2015-01-29 MED ORDER — KCL IN DEXTROSE-NACL 20-5-0.45 MEQ/L-%-% IV SOLN
INTRAVENOUS | Status: DC
  Administered 2015-01-30 – 2015-01-31 (×3): via INTRAVENOUS
  Filled 2015-01-29 (×3): qty 1000

## 2015-01-29 NOTE — Telephone Encounter (Signed)
Pt daughter called to inquire about getting insurance for her dad who needs surgery.  NCM referred her to Buddy Duty, [facility] Elig[employer] Specialist to have him enrolled in the orange card program.  No further CM needs at this time.

## 2015-01-29 NOTE — ED Notes (Signed)
Patient returned from US.

## 2015-01-29 NOTE — H&P (Signed)
Thomas Ortiz is an 59 y.o. male.   Chief Complaint: right abd pain HPI: 59 year old Hispanic male came back to the emergency room this evening complaining of recurrent right upper quadrant and epigastric pain. He had been in the emergency room on August 8 with similar complaints. His pain was generally occurring after eating meals. It would last for a few hours. It was often associated with nausea but no vomiting. He denies fevers and chills. He contacted our office to try to schedule appointment but reportedly had no money for co-pay. He denies any weight loss. He denies any melanoma, hematochezia, or acholic stools. He denies any chest pain or chest pressure. He denies any shortness of breath. He denies any alcohol or drug use. He denies any prior surgery  Past Medical History  Diagnosis Date  . Hypertension     Past Surgical History  Procedure Laterality Date  . Bilateral foot sx      History reviewed. No pertinent family history. Social History:  reports that he has never smoked. He does not have any smokeless tobacco history on file. He reports that he drinks about 1.2 oz of alcohol per week. He reports that he does not use illicit drugs.  Allergies: No Known Allergies   (Not in a hospital admission)  Results for orders placed or performed during the hospital encounter of 01/29/15 (from the past 48 hour(s))  Lipase, blood     Status: None   Collection Time: 01/29/15  5:52 PM  Result Value Ref Range   Lipase 25 22 - 51 U/L  Comprehensive metabolic panel     Status: Abnormal   Collection Time: 01/29/15  5:52 PM  Result Value Ref Range   Sodium 137 135 - 145 mmol/L   Potassium 4.3 3.5 - 5.1 mmol/L   Chloride 104 101 - 111 mmol/L   CO2 24 22 - 32 mmol/L   Glucose, Bld 109 (H) 65 - 99 mg/dL   BUN 10 6 - 20 mg/dL   Creatinine, Ser 0.96 0.61 - 1.24 mg/dL   Calcium 9.5 8.9 - 10.3 mg/dL   Total Protein 8.8 (H) 6.5 - 8.1 g/dL   Albumin 3.7 3.5 - 5.0 g/dL   AST 22 15 - 41 U/L   ALT  26 17 - 63 U/L   Alkaline Phosphatase 108 38 - 126 U/L   Total Bilirubin 0.3 0.3 - 1.2 mg/dL   GFR calc non Af Amer >60 >60 mL/min   GFR calc Af Amer >60 >60 mL/min    Comment: (NOTE) The eGFR has been calculated using the CKD EPI equation. This calculation has not been validated in all clinical situations. eGFR's persistently <60 mL/min signify possible Chronic Kidney Disease.    Anion gap 9 5 - 15  CBC     Status: Abnormal   Collection Time: 01/29/15  5:52 PM  Result Value Ref Range   WBC 10.4 4.0 - 10.5 K/uL   RBC 4.09 (L) 4.22 - 5.81 MIL/uL   Hemoglobin 12.9 (L) 13.0 - 17.0 g/dL   HCT 38.3 (L) 39.0 - 52.0 %   MCV 93.6 78.0 - 100.0 fL   MCH 31.5 26.0 - 34.0 pg   MCHC 33.7 30.0 - 36.0 g/dL   RDW 13.4 11.5 - 15.5 %   Platelets 430 (H) 150 - 400 K/uL  Urinalysis, Routine w reflex microscopic (not at [facility])     Status: None   Collection Time: 01/29/15  7:54 PM  Result Value Ref Range  Color, Urine YELLOW YELLOW   APPearance CLEAR CLEAR   Specific Gravity, Urine 1.007 1.005 - 1.030   pH 5.0 5.0 - 8.0   Glucose, UA NEGATIVE NEGATIVE mg/dL   Hgb urine dipstick NEGATIVE NEGATIVE   Bilirubin Urine NEGATIVE NEGATIVE   Ketones, ur NEGATIVE NEGATIVE mg/dL   Protein, ur NEGATIVE NEGATIVE mg/dL   Urobilinogen, UA 0.2 0.0 - 1.0 mg/dL   Nitrite NEGATIVE NEGATIVE   Leukocytes, UA NEGATIVE NEGATIVE    Comment: MICROSCOPIC NOT DONE ON URINES WITH NEGATIVE PROTEIN, BLOOD, LEUKOCYTES, NITRITE, OR GLUCOSE <1000 mg/dL.   US Abdomen Complete  01/29/2015   CLINICAL DATA:  Acute onset of right upper quadrant abdominal pain and epigastric pain. Initial encounter.  EXAM: ULTRASOUND ABDOMEN COMPLETE  COMPARISON:  Abdominal ultrasound performed 01/22/2015  FINDINGS: Gallbladder: A 2.8 cm stone is seen lodged at the gallbladder neck. Several tiny stones and sludge are noted within the gallbladder fundus. The gallbladder wall is somewhat thickened, measuring up to 3 mm. No ultrasonographic Murphy's sign  is elicited, though pain is noted at the level of the gallbladder despite medication.  Common bile duct: Diameter: 0.6 cm, within normal limits in caliber.  Liver: No focal lesion identified. Within normal limits in parenchymal echogenicity.  IVC: No abnormality visualized.  Pancreas: Visualized portion unremarkable.  Spleen: Size and appearance within normal limits.  Right Kidney: Length: 10.5 cm. Echogenicity within normal limits. No mass or hydronephrosis visualized.  Left Kidney: Length: 10.4 cm. Echogenicity within normal limits. No mass or hydronephrosis visualized.  Abdominal aorta: No aneurysm visualized.  Other findings: None.  IMPRESSION: 2.8 cm stone lodged at the gallbladder neck, with mild gallbladder wall thickening. Tiny stones and sludge at the gallbladder fundus. This could reflect mild cholecystitis, given corresponding pain and tenderness at the level of the gallbladder despite medication. No definite ultrasonographic Murphy's sign is elicited.   Electronically Signed   By: Garald Balding M.D.   On: 01/29/2015 21:10    Review of Systems  Constitutional: Negative for weight loss.  HENT: Negative for nosebleeds.   Eyes: Negative for blurred vision.  Respiratory: Negative for shortness of breath.   Cardiovascular: Negative for chest pain, palpitations, orthopnea and PND.       Denies DOE  Gastrointestinal: Positive for nausea and abdominal pain. Negative for blood in stool and melena.  Genitourinary: Negative for dysuria and hematuria.  Musculoskeletal: Negative.   Skin: Negative for itching and rash.  Neurological: Negative for dizziness, focal weakness, seizures, loss of consciousness and headaches.       Denies TIAs, amaurosis fugax  Endo/Heme/Allergies: Does not bruise/bleed easily.  Psychiatric/Behavioral: The patient is not nervous/anxious.     Blood pressure 135/91, pulse 71, temperature 98.3 F (36.8 C), temperature source Oral, resp. rate 18, SpO2 97 %. Physical Exam    Vitals reviewed. Constitutional: He is oriented to person, place, and time. He appears well-developed and well-nourished. No distress.  HENT:  Head: Normocephalic and atraumatic.  Right Ear: External ear normal.  Left Ear: External ear normal.  Eyes: Conjunctivae are normal. No scleral icterus.  Neck: Normal range of motion. Neck supple. No tracheal deviation present. No thyromegaly present.  Cardiovascular: Normal rate and normal heart sounds.   Respiratory: Effort normal and breath sounds normal. No stridor. No respiratory distress. He has no wheezes.  GI: Soft. He exhibits no distension. There is tenderness in the right upper quadrant. There is no rigidity, no rebound and no guarding.    Musculoskeletal: He exhibits no  edema or tenderness.  Lymphadenopathy:    He has no cervical adenopathy.  Neurological: He is alert and oriented to person, place, and time. He exhibits normal muscle tone.  Skin: Skin is warm and dry. No rash noted. He is not diaphoretic. No erythema. No pallor.  Psychiatric: He has a normal mood and affect. His behavior is normal. Judgment and thought content normal.     Assessment/Plan Chronic cholecystitis with calculus possible acute cholecystitis HTN  Although he is afebrile with a normal white blood cell count he is still tender on exam and he has some mild wall thickening on ultrasound which may reflect either early acute cholecystitis or chronic calculus cholecystitis. This is his second trip to the emergency room in less than a week. Therefore I recommended admission, IV antibiotic's and surgical removal of his gallbladder.  I believe the patient's symptoms are consistent with gallbladder disease.  We discussed gallbladder disease. The patient was given Neurosurgeon. We discussed non-operative and operative management. We discussed the signs & symptoms of acute cholecystitis  I discussed laparoscopic cholecystectomy with IOC in detail.  The patient  was given educational material as well as diagrams detailing the procedure.  We discussed the risks and benefits of a laparoscopic cholecystectomy including, but not limited to bleeding, infection, injury to surrounding structures such as the intestine or liver, bile leak, retained gallstones, need to convert to an open procedure, prolonged diarrhea, blood clots such as  DVT, common bile duct injury, anesthesia risks, and possible need for additional procedures.  We discussed the typical post-operative recovery course. I explained that the likelihood of improvement of their symptoms is good.  I explained that my partner would be performing the surgery later today. He was given information in Spanish as well  Leighton Ruff. Redmond Pulling, MD, FACS General, Bariatric, & Minimally Invasive Surgery [facility], [place]   [facility] M 01/29/2015, 10:31 PM

## 2015-01-29 NOTE — ED Provider Notes (Signed)
CSN: [id number]     Arrival date & time 01/29/15  1710 History   First MD Initiated Contact with Patient 01/29/15 1914     Chief Complaint  Patient presents with  . Abdominal Pain     (Consider location/radiation/quality/duration/timing/severity/associated sxs/prior Treatment) Patient is a 59 y.o. male presenting with abdominal pain. The history is provided by the patient and medical records.  Abdominal Pain   This is a 59 year old male with history of hypertension and recently diagnosed gallstones 1 week ago, presenting to the ED for persistent abdominal pain. I personally evaluated patient 1 week ago at his initial presentation. Ultrasound positive for gallstones at that time, no findings of acute cholecystitis. Patient was discharged home and instructed to follow-up with surgery, however could not afford co-pay. States over the past week he has had intermittent right upper quadrant abdominal pain, worse after eating but independent of type of food. He reports some nausea but denies vomiting. When pain occurs it is sharp and stabbing.  He denies any fever or chills. Patient said no prior abdominal surgeries.  Has been taking pain medication as prescribed, some relief. On arrival, pain is minimal and patient states he is comfortable at this time.  VSS.   Past Medical History  Diagnosis Date  . Hypertension    Past Surgical History  Procedure Laterality Date  . Bilateral foot sx     History reviewed. No pertinent family history. Social History  Substance Use Topics  . Smoking status: Never Smoker   . Smokeless tobacco: None  . Alcohol Use: 1.2 oz/week    2 Cans of beer per week     Comment: occasionally    Review of Systems  Gastrointestinal: Positive for abdominal pain.  All other systems reviewed and are negative.     Allergies  Review of patient's allergies indicates no known allergies.  Home Medications   Prior to Admission medications   Medication Sig Start Date End  Date Taking? Authorizing Provider  acetaminophen (TYLENOL) 325 MG tablet Take 650 mg by mouth every 6 (six) hours as needed for mild pain.   Yes Historical Provider, MD   BP 138/88 mmHg  Pulse 67  Temp(Src) 98.3 F (36.8 C) (Oral)  Resp 18  SpO2 99%   Physical Exam  Constitutional: He is oriented to person, place, and time. He appears well-developed and well-nourished. No distress.  HENT:  Head: Normocephalic and atraumatic.  Mouth/Throat: Oropharynx is clear and moist.  Eyes: Conjunctivae and EOM are normal. Pupils are equal, round, and reactive to light.  Neck: Normal range of motion.  Cardiovascular: Normal rate, regular rhythm and normal heart sounds.   Pulmonary/Chest: Effort normal and breath sounds normal. No respiratory distress. He has no wheezes.  Abdominal: Soft. Bowel sounds are normal. There is tenderness in the right upper quadrant and epigastric area. There is no tenderness at McBurney's point and negative Murphy's sign.  Abdomen soft, nondistended, tenderness in right upper quadrant without definitive Murphy's sign; mild TTP in epigastrium as well  Musculoskeletal: Normal range of motion.  Neurological: He is alert and oriented to person, place, and time.  Skin: Skin is warm and dry. He is not diaphoretic.  Psychiatric: He has a normal mood and affect.  Nursing note and vitals reviewed.   ED Course  Procedures (including critical care time) Labs Review Labs Reviewed  COMPREHENSIVE METABOLIC PANEL - Abnormal; Notable for the following:    Glucose, Bld 109 (*)    Total Protein 8.8 (*)  All other components within normal limits  CBC - Abnormal; Notable for the following:    RBC 4.09 (*)    Hemoglobin 12.9 (*)    HCT 38.3 (*)    Platelets 430 (*)    All other components within normal limits  LIPASE, BLOOD  URINALYSIS, ROUTINE W REFLEX MICROSCOPIC (NOT AT [facility])    Imaging Review US Abdomen Complete  01/29/2015   CLINICAL DATA:  Acute onset of right upper  quadrant abdominal pain and epigastric pain. Initial encounter.  EXAM: ULTRASOUND ABDOMEN COMPLETE  COMPARISON:  Abdominal ultrasound performed 01/22/2015  FINDINGS: Gallbladder: A 2.8 cm stone is seen lodged at the gallbladder neck. Several tiny stones and sludge are noted within the gallbladder fundus. The gallbladder wall is somewhat thickened, measuring up to 3 mm. No ultrasonographic Murphy's sign is elicited, though pain is noted at the level of the gallbladder despite medication.  Common bile duct: Diameter: 0.6 cm, within normal limits in caliber.  Liver: No focal lesion identified. Within normal limits in parenchymal echogenicity.  IVC: No abnormality visualized.  Pancreas: Visualized portion unremarkable.  Spleen: Size and appearance within normal limits.  Right Kidney: Length: 10.5 cm. Echogenicity within normal limits. No mass or hydronephrosis visualized.  Left Kidney: Length: 10.4 cm. Echogenicity within normal limits. No mass or hydronephrosis visualized.  Abdominal aorta: No aneurysm visualized.  Other findings: None.  IMPRESSION: 2.8 cm stone lodged at the gallbladder neck, with mild gallbladder wall thickening. Tiny stones and sludge at the gallbladder fundus. This could reflect mild cholecystitis, given corresponding pain and tenderness at the level of the gallbladder despite medication. No definite ultrasonographic Murphy's sign is elicited.   Electronically Signed   By: Roanna Raider M.D.   On: 01/29/2015 21:10   I, Dimitra Woodstock M, personally reviewed and evaluated these images and lab results as part of my medical decision-making.   EKG Interpretation None      MDM   Final diagnoses:  Cholecystitis   59 year old male with recently diagnosed gallstones 1 week ago, here with recurrent right upper quadrant pain. He was unable to follow-up with surgery due to a chronic cough. Patient is afebrile, nontoxic. He does have some tenderness in his right upper quadrant, no definitive  Murphy's sign. I personally evaluated patient and his visit 1 week ago, pain seems much more controlled at this time. Labwork appears similar, no leukocytosis or transaminitis. Relief remains within normal limits. Repeat ultrasound was obtained due to persistent symptoms, 2.8 cm stone lodged at gallbladder neck with bladder wall thickening consistent with mild cholecystitis. Case was discussed with general surgery, Dr. Andrey Campanile, who will admit, likely surgery tomorrow morning.  Patient and family members updated on plan, in full agreement with admission and surgery at this time.  Garlon Hatchet, PA-C 01/29/15 2237  Lyndal Pulley, MD 01/29/15 [phone]

## 2015-01-29 NOTE — ED Notes (Signed)
Patient transported to Ultrasound 

## 2015-01-29 NOTE — ED Notes (Signed)
Pt was here one week ago for abd pain and n/v. Was diagnosed with gallstones but pt unable to get appt with surgeon yet. Still has pain across his abd, denies n/v.

## 2015-01-29 NOTE — ED Notes (Signed)
Patient reports that abdominal pain is intermittent, and same as week ago when diagnosed with gall stones. Patient has not seen surgeon for gall stones d/t lack of funds - per patient/family. Reports pain as 4/10 at this time, but increases to 9/10 when sharp pain occurs.

## 2015-01-30 ENCOUNTER — Encounter (HOSPITAL_COMMUNITY): Payer: Self-pay | Admitting: Certified Registered"

## 2015-01-30 ENCOUNTER — Observation Stay (HOSPITAL_COMMUNITY): Payer: Self-pay | Admitting: Certified Registered"

## 2015-01-30 ENCOUNTER — Observation Stay (HOSPITAL_COMMUNITY): Payer: Self-pay

## 2015-01-30 ENCOUNTER — Encounter (HOSPITAL_COMMUNITY): Admission: EM | Disposition: A | Discharge status: Home / Self Care | Payer: Self-pay | Source: Home / Self Care

## 2015-01-30 ENCOUNTER — Encounter (HOSPITAL_COMMUNITY): Payer: Self-pay

## 2015-01-30 DIAGNOSIS — K810 Acute cholecystitis: Secondary | ICD-10-CM | POA: Diagnosis present

## 2015-01-30 HISTORY — PX: CHOLECYSTECTOMY: SHX55

## 2015-01-30 LAB — SURGICAL PCR SCREEN
MRSA, PCR: NEGATIVE
Staphylococcus aureus: NEGATIVE

## 2015-01-30 SURGERY — LAPAROSCOPIC CHOLECYSTECTOMY WITH INTRAOPERATIVE CHOLANGIOGRAM
Anesthesia: General | Site: Abdomen

## 2015-01-30 MED ORDER — PROPOFOL 10 MG/ML IV BOLUS
INTRAVENOUS | Status: AC
  Filled 2015-01-30: qty 20

## 2015-01-30 MED ORDER — LACTATED RINGERS IV SOLN
INTRAVENOUS | Status: DC
  Administered 2015-01-30 (×2): via INTRAVENOUS

## 2015-01-30 MED ORDER — BUPIVACAINE-EPINEPHRINE (PF) 0.25% -1:200000 IJ SOLN
INTRAMUSCULAR | Status: AC
  Filled 2015-01-30: qty 30

## 2015-01-30 MED ORDER — FENTANYL CITRATE (PF) 100 MCG/2ML IJ SOLN
INTRAMUSCULAR | Status: AC
  Filled 2015-01-30: qty 2

## 2015-01-30 MED ORDER — PROPOFOL 10 MG/ML IV BOLUS
INTRAVENOUS | Status: DC | PRN
  Administered 2015-01-30: 180 mg via INTRAVENOUS
  Administered 2015-01-30: 50 mg via INTRAVENOUS

## 2015-01-30 MED ORDER — ONDANSETRON HCL 4 MG/2ML IJ SOLN
INTRAMUSCULAR | Status: DC | PRN
  Administered 2015-01-30: 4 mg via INTRAVENOUS

## 2015-01-30 MED ORDER — SODIUM CHLORIDE 0.9 % IR SOLN
Status: DC | PRN
  Administered 2015-01-30: 1000 mL

## 2015-01-30 MED ORDER — 0.9 % SODIUM CHLORIDE (POUR BTL) OPTIME
TOPICAL | Status: DC | PRN
  Administered 2015-01-30: 1000 mL

## 2015-01-30 MED ORDER — ROCURONIUM BROMIDE 100 MG/10ML IV SOLN
INTRAVENOUS | Status: DC | PRN
  Administered 2015-01-30: 10 mg via INTRAVENOUS
  Administered 2015-01-30: 5 mg via INTRAVENOUS
  Administered 2015-01-30: 25 mg via INTRAVENOUS

## 2015-01-30 MED ORDER — SUGAMMADEX SODIUM 200 MG/2ML IV SOLN
INTRAVENOUS | Status: DC | PRN
  Administered 2015-01-30: 150 mg via INTRAVENOUS

## 2015-01-30 MED ORDER — SUGAMMADEX SODIUM 200 MG/2ML IV SOLN
INTRAVENOUS | Status: AC
  Filled 2015-01-30: qty 2

## 2015-01-30 MED ORDER — ONDANSETRON HCL 4 MG/2ML IJ SOLN
INTRAMUSCULAR | Status: AC
  Filled 2015-01-30: qty 2

## 2015-01-30 MED ORDER — ENOXAPARIN SODIUM 40 MG/0.4ML ~~LOC~~ SOLN
40.0000 mg | SUBCUTANEOUS | Status: DC
  Administered 2015-01-31: 40 mg via SUBCUTANEOUS
  Filled 2015-01-30: qty 0.4

## 2015-01-30 MED ORDER — OXYCODONE HCL 5 MG PO TABS
5.0000 mg | ORAL_TABLET | Freq: Once | ORAL | Status: AC | PRN
  Administered 2015-01-30: 5 mg via ORAL

## 2015-01-30 MED ORDER — SUCCINYLCHOLINE CHLORIDE 20 MG/ML IJ SOLN
INTRAMUSCULAR | Status: DC | PRN
  Administered 2015-01-30: 100 mg via INTRAVENOUS

## 2015-01-30 MED ORDER — SODIUM CHLORIDE 0.9 % IV SOLN
INTRAVENOUS | Status: DC | PRN
  Administered 2015-01-30: 17 mL

## 2015-01-30 MED ORDER — FENTANYL CITRATE (PF) 250 MCG/5ML IJ SOLN
INTRAMUSCULAR | Status: DC | PRN
  Administered 2015-01-30 (×2): 100 ug via INTRAVENOUS
  Administered 2015-01-30: 50 ug via INTRAVENOUS

## 2015-01-30 MED ORDER — NEOSTIGMINE METHYLSULFATE 10 MG/10ML IV SOLN
INTRAVENOUS | Status: AC
  Filled 2015-01-30: qty 1

## 2015-01-30 MED ORDER — OXYCODONE HCL 5 MG/5ML PO SOLN: 5.0000 mg | Freq: Once | ORAL | Status: AC | PRN

## 2015-01-30 MED ORDER — MIDAZOLAM HCL 2 MG/2ML IJ SOLN
INTRAMUSCULAR | Status: AC
  Filled 2015-01-30: qty 4

## 2015-01-30 MED ORDER — OXYCODONE HCL 5 MG PO TABS
ORAL_TABLET | ORAL | Status: AC
  Filled 2015-01-30: qty 1

## 2015-01-30 MED ORDER — GLYCOPYRROLATE 0.2 MG/ML IJ SOLN
INTRAMUSCULAR | Status: AC
  Filled 2015-01-30: qty 2

## 2015-01-30 MED ORDER — BUPIVACAINE-EPINEPHRINE 0.25% -1:200000 IJ SOLN
INTRAMUSCULAR | Status: DC | PRN
  Administered 2015-01-30: 8 mL

## 2015-01-30 MED ORDER — LIDOCAINE HCL (CARDIAC) 20 MG/ML IV SOLN
INTRAVENOUS | Status: AC
  Filled 2015-01-30: qty 5

## 2015-01-30 MED ORDER — OXYCODONE-ACETAMINOPHEN 5-325 MG PO TABS
1.0000 | ORAL_TABLET | ORAL | Status: DC | PRN
  Administered 2015-01-30 – 2015-01-31 (×3): 2 via ORAL
  Filled 2015-01-30 (×4): qty 2

## 2015-01-30 MED ORDER — FENTANYL CITRATE (PF) 250 MCG/5ML IJ SOLN
INTRAMUSCULAR | Status: AC
  Filled 2015-01-30: qty 5

## 2015-01-30 MED ORDER — PHENYLEPHRINE HCL 10 MG/ML IJ SOLN
INTRAMUSCULAR | Status: DC | PRN
  Administered 2015-01-30 (×2): 80 ug via INTRAVENOUS

## 2015-01-30 MED ORDER — ONDANSETRON HCL 4 MG/2ML IJ SOLN: 4.0000 mg | Freq: Once | INTRAMUSCULAR | Status: DC | PRN

## 2015-01-30 MED ORDER — HEMOSTATIC AGENTS (NO CHARGE) OPTIME
TOPICAL | Status: DC | PRN
  Administered 2015-01-30: 1 via TOPICAL

## 2015-01-30 MED ORDER — LIDOCAINE HCL (CARDIAC) 20 MG/ML IV SOLN
INTRAVENOUS | Status: DC | PRN
  Administered 2015-01-30: 50 mg via INTRAVENOUS

## 2015-01-30 MED ORDER — FENTANYL CITRATE (PF) 100 MCG/2ML IJ SOLN
25.0000 ug | INTRAMUSCULAR | Status: DC | PRN
  Administered 2015-01-30 (×4): 25 ug via INTRAVENOUS

## 2015-01-30 MED ORDER — DOCUSATE SODIUM 100 MG PO CAPS: 100.0000 mg | ORAL_CAPSULE | Freq: Two times a day (BID) | ORAL | Status: DC | PRN

## 2015-01-30 SURGICAL SUPPLY — 46 items
APPLIER CLIP ROT 10 11.4 M/L (STAPLE) ×3
BENZOIN TINCTURE PRP APPL 2/3 (GAUZE/BANDAGES/DRESSINGS) ×3 IMPLANT
BLADE SURG ROTATE 9660 (MISCELLANEOUS) ×3 IMPLANT
CANISTER SUCTION 2500CC (MISCELLANEOUS) ×3 IMPLANT
CHLORAPREP W/TINT 26ML (MISCELLANEOUS) ×3 IMPLANT
CLIP APPLIE ROT 10 11.4 M/L (STAPLE) ×1 IMPLANT
CLOSURE STERI-STRIP 1/2X4 (GAUZE/BANDAGES/DRESSINGS) ×1
CLSR STERI-STRIP ANTIMIC 1/2X4 (GAUZE/BANDAGES/DRESSINGS) ×2 IMPLANT
COVER MAYO STAND STRL (DRAPES) ×3 IMPLANT
COVER SURGICAL LIGHT HANDLE (MISCELLANEOUS) ×3 IMPLANT
DRAPE C-ARM 42X72 X-RAY (DRAPES) ×3 IMPLANT
DRSG TEGADERM 2-3/8X2-3/4 SM (GAUZE/BANDAGES/DRESSINGS) ×9 IMPLANT
DRSG TEGADERM 4X4.75 (GAUZE/BANDAGES/DRESSINGS) ×3 IMPLANT
ELECT REM PT RETURN 9FT ADLT (ELECTROSURGICAL) ×3
ELECTRODE REM PT RTRN 9FT ADLT (ELECTROSURGICAL) ×1 IMPLANT
FILTER SMOKE EVAC LAPAROSHD (FILTER) IMPLANT
GAUZE SPONGE 2X2 8PLY STRL LF (GAUZE/BANDAGES/DRESSINGS) ×1 IMPLANT
GLOVE BIO SURGEON STRL SZ7 (GLOVE) ×3 IMPLANT
GLOVE BIOGEL PI IND STRL 7.0 (GLOVE) ×1 IMPLANT
GLOVE BIOGEL PI IND STRL 7.5 (GLOVE) ×1 IMPLANT
GLOVE BIOGEL PI INDICATOR 7.0 (GLOVE) ×2
GLOVE BIOGEL PI INDICATOR 7.5 (GLOVE) ×2
GLOVE SURG SS PI 7.0 STRL IVOR (GLOVE) ×3 IMPLANT
GOWN STRL REUS W/ TWL LRG LVL3 (GOWN DISPOSABLE) ×3 IMPLANT
GOWN STRL REUS W/TWL LRG LVL3 (GOWN DISPOSABLE) ×6
HEMOSTAT SNOW SURGICEL 2X4 (HEMOSTASIS) ×3 IMPLANT
KIT BASIN OR (CUSTOM PROCEDURE TRAY) ×3 IMPLANT
KIT ROOM TURNOVER OR (KITS) ×3 IMPLANT
NS IRRIG 1000ML POUR BTL (IV SOLUTION) ×3 IMPLANT
PAD ARMBOARD 7.5X6 YLW CONV (MISCELLANEOUS) ×3 IMPLANT
POUCH SPECIMEN RETRIEVAL 10MM (ENDOMECHANICALS) ×3 IMPLANT
SCISSORS LAP 5X35 DISP (ENDOMECHANICALS) ×3 IMPLANT
SET CHOLANGIOGRAPH 5 50 .035 (SET/KITS/TRAYS/PACK) ×3 IMPLANT
SET IRRIG TUBING LAPAROSCOPIC (IRRIGATION / IRRIGATOR) ×3 IMPLANT
SLEEVE ENDOPATH XCEL 5M (ENDOMECHANICALS) ×3 IMPLANT
SPECIMEN JAR SMALL (MISCELLANEOUS) ×3 IMPLANT
SPONGE GAUZE 2X2 STER 10/PKG (GAUZE/BANDAGES/DRESSINGS) ×2
SUT MNCRL AB 4-0 PS2 18 (SUTURE) ×3 IMPLANT
SUT VICRYL 0 UR6 27IN ABS (SUTURE) ×3 IMPLANT
TOWEL OR 17X24 6PK STRL BLUE (TOWEL DISPOSABLE) IMPLANT
TOWEL OR 17X26 10 PK STRL BLUE (TOWEL DISPOSABLE) ×3 IMPLANT
TRAY LAPAROSCOPIC MC (CUSTOM PROCEDURE TRAY) ×3 IMPLANT
TROCAR XCEL BLUNT TIP 100MML (ENDOMECHANICALS) ×3 IMPLANT
TROCAR XCEL NON-BLD 11X100MML (ENDOMECHANICALS) ×6 IMPLANT
TROCAR XCEL NON-BLD 5MMX100MML (ENDOMECHANICALS) ×3 IMPLANT
TUBING INSUFFLATION (TUBING) ×3 IMPLANT

## 2015-01-30 NOTE — Anesthesia Procedure Notes (Signed)
Procedure Name: Intubation Date/Time: 01/30/2015 11:46 AM Performed by: Marylyn Ishihara Pre-anesthesia Checklist: Patient identified, Emergency Drugs available, Suction available, Patient being monitored and Timeout performed Patient Re-evaluated:Patient Re-evaluated prior to inductionOxygen Delivery Method: Circle system utilized Preoxygenation: Pre-oxygenation with 100% oxygen Intubation Type: IV induction Ventilation: Mask ventilation without difficulty Laryngoscope Size: Mac and 3 Grade View: Grade I Tube type: Oral Tube size: 7.5 mm Number of attempts: 1 Airway Equipment and Method: Stylet Placement Confirmation: ETT inserted through vocal cords under direct vision,  positive ETCO2 and breath sounds checked- equal and bilateral Secured at: 21 cm Tube secured with: Tape Dental Injury: Teeth and Oropharynx as per pre-operative assessment

## 2015-01-30 NOTE — Op Note (Signed)
Laparoscopic Cholecystectomy with IOC Procedure Note  Indications: This patient presents with symptomatic gallbladder disease and will undergo laparoscopic cholecystectomy.  Ultrasound showed thickening of the gallbladder wall with a 2.8 cm stone lodged in the neck of the gallbladder.  Pre-operative Diagnosis: Acute cholecystitis  Post-operative Diagnosis: Gangrenous cholecystitis  Surgeon: Lorrane Mccay K.   Assistants: none  Anesthesia: General endotracheal anesthesia  ASA Class: 2E  Procedure Details  The patient was seen again in the Holding Room. The risks, benefits, complications, treatment options, and expected outcomes were discussed with the patient. The possibilities of reaction to medication, pulmonary aspiration, perforation of viscus, bleeding, recurrent infection, finding a normal gallbladder, the need for additional procedures, failure to diagnose a condition, the possible need to convert to an open procedure, and creating a complication requiring transfusion or operation were discussed with the patient. The likelihood of improving the patient's symptoms with return to their baseline status is good.  The patient and/or family concurred with the proposed plan, giving informed consent. The site of surgery properly noted. The patient was taken to Operating Room, identified as Thomas Ortiz and the procedure verified as Laparoscopic Cholecystectomy with Intraoperative Cholangiogram. A Time Out was held and the above information confirmed.  Prior to the induction of general anesthesia, antibiotic prophylaxis was administered. General endotracheal anesthesia was then administered and tolerated well. After the induction, the abdomen was prepped with Chloraprep and draped in the sterile fashion. The patient was positioned in the supine position.  Local anesthetic agent was injected into the skin near the umbilicus and an incision made. We dissected down to the abdominal fascia with blunt  dissection.  The fascia was incised vertically and we entered the peritoneal cavity bluntly.  A pursestring suture of 0-Vicryl was placed around the fascial opening.  The Hasson cannula was inserted and secured with the stay suture.  Pneumoperitoneum was then created with CO2 and tolerated well without any adverse changes in the patient's vital signs. An 11-mm port was placed in the subxiphoid position.  Two 5-mm ports were placed in the right upper quadrant. All skin incisions were infiltrated with a local anesthetic agent before making the incision and placing the trocars.   We positioned the patient in reverse Trendelenburg, tilted slightly to the patient's left.  The transverse colon and omentum were densely adherent to the liver and the gallbladder.  The omentum was bluntly dissected away from the fundus of the gallbladder.  The wall of the gallbladder was gangrenous.  We decompressed the gallbladder with the suction aspirator.  The fundus was grasped and retracted cephalad. Adhesions were lysed bluntly and with the electrocautery where indicated, taking care not to injure any adjacent organs or viscus. The infundibulum was grasped and retracted laterally, exposing the peritoneum overlying the triangle of Calot. This was then divided and exposed in a blunt fashion. A critical view of the cystic duct and cystic artery was obtained.  The cystic duct was clearly identified and bluntly dissected circumferentially. The cystic duct was ligated with a clip distally.   An incision was made in the cystic duct and the [facility] cholangiogram catheter introduced. The catheter was secured using a clip. A cholangiogram was then obtained which showed good visualization of the distal and proximal biliary tree with no sign of filling defects or obstruction.  Contrast flowed easily into the duodenum.  Some of the contrast did leak around the clip on the cystic duct.  The catheter was then removed.   The cystic duct was then  ligated with clips and divided. The cystic artery was identified, dissected free, ligated with clips and divided as well.   The gallbladder was dissected from the liver bed in retrograde fashion with the electrocautery. The gallbladder was removed and placed in an Endocatch sac. The liver bed was irrigated and inspected. Hemostasis was achieved with the electrocautery and surgicel SNOW. Copious irrigation was utilized and was repeatedly aspirated until clear.  The gallbladder and Endocatch sac were then removed through the umbilical port site.  This was quite difficult because of the enlarged thickened gallbladder and the large gallstone.  We had to enlarge the fascial opening slightly.  As we were removing the gallbladder, the endocatch sac broke.  The gallbladder was removed, but the large gallstone remained inside the abdomen.  We were able to grasp the stone and remove it in fragments from the abdomen. The pursestring suture was used to close the umbilical fascia.  We irrigated the umbilicus and the abdominal cavity thoroughly and removed any visible stone fragments.  We again inspected the right upper quadrant for hemostasis.  Pneumoperitoneum was released as we removed the trocars.  4-0 Monocryl was used to close the skin.   Benzoin, steri-strips, and clean dressings were applied. The patient was then extubated and brought to the recovery room in stable condition. Instrument, sponge, and needle counts were correct at closure and at the conclusion of the case.   Findings: Cholecystitis with Cholelithiasis  Estimated Blood Loss: less than 100 mL         Drains: none         Specimens: Gallbladder           Complications: None; patient tolerated the procedure well.         Disposition: PACU - hemodynamically stable.         Condition: stable  Wilmon Arms. Corliss Skains, MD, [facility]  General/ Trauma Surgery  01/30/2015 1:50 PM

## 2015-01-30 NOTE — Anesthesia Preprocedure Evaluation (Addendum)
Anesthesia Evaluation  Patient identified by MRN, date of birth, ID band Patient awake    Reviewed: Allergy & Precautions, NPO status , Patient's Chart, lab work & pertinent test results  Airway Mallampati: II  TM Distance: >3 FB Neck ROM: Full    Dental  (+) Teeth Intact, Dental Advisory Given   Pulmonary    breath sounds clear to auscultation       Cardiovascular hypertension,  Rhythm:Regular Rate:Normal     Neuro/Psych    GI/Hepatic   Endo/Other    Renal/GU      Musculoskeletal   Abdominal   Peds  Hematology   Anesthesia Other Findings   Reproductive/Obstetrics                            Anesthesia Physical Anesthesia Plan  ASA: II  Anesthesia Plan: General   Post-op Pain Management:    Induction: Intravenous  Airway Management Planned: Oral ETT  Additional Equipment:   Intra-op Plan:   Post-operative Plan: Extubation in OR  Informed Consent: I have reviewed the patients History and Physical, chart, labs and discussed the procedure including the risks, benefits and alternatives for the proposed anesthesia with the patient or authorized representative who has indicated his/her understanding and acceptance.   Dental advisory given  Plan Discussed with: CRNA and Anesthesiologist  Anesthesia Plan Comments:        Anesthesia Quick Evaluation  

## 2015-01-30 NOTE — Anesthesia Postprocedure Evaluation (Signed)
  Anesthesia Post-op Note  Patient: Thomas Ortiz  Procedure(s) Performed: Procedure(s): LAPAROSCOPIC CHOLECYSTECTOMY WITH INTRAOPERATIVE CHOLANGIOGRAM (N/A)  Patient Location: PACU  Anesthesia Type:General  Level of Consciousness: awake, alert  and oriented  Airway and Oxygen Therapy: Patient Spontanous Breathing and Patient connected to nasal cannula oxygen  Post-op Pain: mild  Post-op Assessment: Post-op Vital signs reviewed, Patient's Cardiovascular Status Stable, Respiratory Function Stable, Patent Airway and Pain level controlled              Post-op Vital Signs: stable  Last Vitals:  Filed Vitals:   01/30/15 1435  BP: 144/92  Pulse: 80  Temp: 36.9 C  Resp: 16    Complications: No apparent anesthesia complications

## 2015-01-30 NOTE — Transfer of Care (Signed)
Immediate Anesthesia Transfer of Care Note  Patient: Thomas Ortiz  Procedure(s) Performed: Procedure(s): LAPAROSCOPIC CHOLECYSTECTOMY WITH INTRAOPERATIVE CHOLANGIOGRAM (N/A)  Patient Location: PACU  Anesthesia Type:General  Level of Consciousness: awake, alert , oriented and patient cooperative  Airway & Oxygen Therapy: Patient Spontanous Breathing and Patient connected to nasal cannula oxygen  Post-op Assessment: Report given to RN, Post -op Vital signs reviewed and stable and Patient moving all extremities  Post vital signs: Reviewed and stable  Last Vitals:  Filed Vitals:   01/30/15 0443  BP: 114/77  Pulse: 70  Temp: 36.7 C  Resp: 19    Complications: No apparent anesthesia complications

## 2015-01-30 NOTE — Progress Notes (Signed)
Initial Nutrition Assessment  DOCUMENTATION CODES:   Not applicable  INTERVENTION:   -RD will follow for diet advancement and supplement diet as appropriate  NUTRITION DIAGNOSIS:   Inadequate oral intake related to altered GI function as evidenced by NPO status.  GOAL:   Patient will meet greater than or equal to 90% of their needs  MONITOR:   PO intake, Diet advancement, Labs, Weight trends, Skin, I & O's  REASON FOR ASSESSMENT:   Malnutrition Screening Tool    ASSESSMENT:   59 year old Hispanic male came back to the emergency room this evening complaining of recurrent right upper quadrant and epigastric pain. He had been in the emergency room on August 8 with similar complaints. His pain was generally occurring after eating meals. It would last for a few hours. It was often associated with nausea but no vomiting. He denies fevers and chills. He contacted our office to try to schedule appointment but reportedly had no money for co-pay. He denies any weight loss. He denies any melanoma, hematochezia, or acholic stools. He denies any chest pain or chest pressure. He denies any shortness of breath. He denies any alcohol or drug use. He denies any prior surgery  Pt admitted with chronic cholecystitis with calculus and possible acute cholecystitis.   Pt unavailable at time of visit. Unable to complete Nutrition-Focused physical exam at this time.   Spoke with RN who reports pt is down for lap chole at time of visit. He has been NPO since admission in preparation for surgery. Per RN, pt does not appear malnourished.   RD will supplement diet as appropriate once diet is advanced.   Diet Order:  Diet NPO time specified  Skin:  Reviewed, no issues  Last BM:  01/29/15  Height:   Ht Readings from Last 1 Encounters:  01/29/15  (1.651 m)    Weight:   Wt Readings from Last 1 Encounters:  01/29/15 159 lb 11.2 oz (72.439 kg)    Ideal Body Weight:  61.8 kg  BMI:  Body  mass index is 26.58 kg/(m^2).  Estimated Nutritional Needs:   Kcal:  1600-1800  Protein:  85-95 grams  Fluid:  1.6-1.8 L  EDUCATION NEEDS:   No education needs identified at this time  Deanette Tullius A. Mayford Knife, RD, LDN, CDE Pager: [phone] After hours Pager: [phone]

## 2015-01-31 ENCOUNTER — Encounter (HOSPITAL_COMMUNITY): Payer: Self-pay | Admitting: Surgery

## 2015-01-31 MED ORDER — OXYCODONE-ACETAMINOPHEN 5-325 MG PO TABS: 1.0000 | ORAL_TABLET | Freq: Four times a day (QID) | ORAL | Status: DC | PRN

## 2015-01-31 MED ORDER — DOCUSATE SODIUM 100 MG PO CAPS: 100.0000 mg | ORAL_CAPSULE | Freq: Two times a day (BID) | ORAL | Status: DC | PRN

## 2015-01-31 MED ORDER — PANTOPRAZOLE SODIUM 40 MG PO TBEC: 40.0000 mg | DELAYED_RELEASE_TABLET | Freq: Every day | ORAL | Status: DC

## 2015-01-31 MED ORDER — AMOXICILLIN-POT CLAVULANATE 875-125 MG PO TABS: 1.0000 | ORAL_TABLET | Freq: Two times a day (BID) | ORAL | Status: DC

## 2015-01-31 NOTE — Discharge Instructions (Signed)
Your appointment is at 3:00pm, please arrive at least 30 min before your appointment to complete your check in paperwork.  If you are unable to arrive 30 min prior to your appointment time we may have to cancel or reschedule you. ° °LAPAROSCOPIC SURGERY: POST OP INSTRUCTIONS  °1. DIET: Follow a light bland diet the first 24 hours after arrival home, such as soup, liquids, crackers, etc. Be sure to include lots of fluids daily. Avoid fast food or heavy meals as your are more likely to get nauseated. Eat a low fat the next few days after surgery.  °2. Take your usually prescribed home medications unless otherwise directed. °3. PAIN CONTROL:  °1. Pain is best controlled by a usual combination of three different methods TOGETHER:  °1. Ice/Heat °2. Over the counter pain medication °3. Prescription pain medication °2. Most patients will experience some swelling and bruising around the incisions. Ice packs or heating pads (30-60 minutes up to 6 times a day) will help. Use ice for the first few days to help decrease swelling and bruising, then switch to heat to help relax tight/sore spots and speed recovery. Some people prefer to use ice alone, heat alone, alternating between ice & heat. Experiment to what works for you. Swelling and bruising can take several weeks to resolve.  °3. It is helpful to take an over-the-counter pain medication regularly for the first few weeks. Choose one of the following that works best for you:  °1. Naproxen (Aleve, etc) Two 220mg tabs twice a day °2. Ibuprofen (Advil, etc) Three 200mg tabs four times a day (every meal & bedtime) °3. Acetaminophen (Tylenol, etc) 500-650mg four times a day (every meal & bedtime) °4. A prescription for pain medication (such as oxycodone, hydrocodone, etc) should be given to you upon discharge. Take your pain medication as prescribed.  °1. If you are having problems/concerns with the prescription medicine (does not control pain, nausea, vomiting, rash, itching,  etc), please call us [phone] to see if we need to switch you to a different pain medicine that will work better for you and/or control your side effect better. °2. If you need a refill on your pain medication, please contact your pharmacy. They will contact our office to request authorization. Prescriptions will not be filled after 5 pm or on week-ends. °4. Avoid getting constipated. Between the surgery and the pain medications, it is common to experience some constipation. Increasing fluid intake and taking a fiber supplement (such as Metamucil, Citrucel, FiberCon, MiraLax, etc) 1-2 times a day regularly will usually help prevent this problem from occurring. A mild laxative (prune juice, Milk of Magnesia, MiraLax, etc) should be taken according to package directions if there are no bowel movements after 48 hours.  °5. Watch out for diarrhea. If you have many loose bowel movements, simplify your diet to bland foods & liquids for a few days. Stop any stool softeners and decrease your fiber supplement. Switching to mild anti-diarrheal medications (Kayopectate, Pepto Bismol) can help. If this worsens or does not improve, please call us. °6. Wash / shower every day. You may shower over the dressings as they are waterproof. Continue to shower over incision(s) after the dressing is off. °7. Remove your waterproof bandages 5 days after surgery. You may leave the incision open to air. You may replace a dressing/Band-Aid to cover the incision for comfort if you wish.  °8. ACTIVITIES as tolerated:  °1. You may resume regular (light) daily activities beginning the next day--such as   daily self-care, walking, climbing stairs--gradually increasing activities as tolerated. If you can walk 30 minutes without difficulty, it is safe to try more intense activity such as jogging, treadmill, bicycling, low-impact aerobics, swimming, etc. 2. Save the most intensive and strenuous activity for last such as sit-ups, heavy lifting,  contact sports, etc Refrain from any heavy lifting or straining until you are off narcotics for pain control.  3. DO NOT PUSH THROUGH PAIN. Let pain be your guide: If it hurts to do something, don't do it. Pain is your body warning you to avoid that activity for another week until the pain goes down. 4. You may drive when you are no longer taking prescription pain medication, you can comfortably wear a seatbelt, and you can safely maneuver your car and apply brakes. 5. You may have sexual intercourse when it is comfortable.  9. FOLLOW UP in our office  1. Please call [facility] at [phone] to set up an appointment to see your surgeon in the office for a follow-up appointment approximately 2-3 weeks after your surgery. 2. Make sure that you call for this appointment the day you arrive home to insure a convenient appointment time.      10. IF YOU HAVE DISABILITY OR FAMILY LEAVE FORMS, BRING THEM TO THE               OFFICE FOR PROCESSING.   WHEN TO CALL us [phone]:  1. Poor pain control 2. Reactions / problems with new medications (rash/itching, nausea, etc)  3. Fever over 101.5 F (38.5 C) 4. Inability to urinate 5. Nausea and/or vomiting 6. Worsening swelling or bruising 7. Continued bleeding from incision. 8. Increased pain, redness, or drainage from the incision  The clinic staff is available to answer your questions during regular business hours (8:30am-5pm). Please dont hesitate to call and ask to speak to one of our nurses for clinical concerns.  If you have a medical emergency, go to the nearest emergency room or call 911.  A surgeon from [facility] is always on call at the [facility], [place]  [address], [address], [place], [place] [address] ?  MAIN: [phone] ? TOLL FREE: [phone] ?  FAX [phone]  [email or web address]   CIRUGIA LAPAROSCOPICA: INSTRUCCIONES DE POST OPERATORIO.  Revise siempre los  documentos que le entreguen en el lugar donde se ha hecho la [place].  SI USTED NECESITA DOCUMENTOS DE INCAPACIDAD (DISABLE) O DE PERMISO FAMILAR (FAMILY LEAVE) NECESITA TRAERLOS A LA OFICINA PARA QUE SEAN PROCESADOS. NO  SE LOS DE A SU DOCTOR. 1. A su alta del hospital se le dara una receta para Human resources officer. Tomela como ha sido recetada, si la necesita. Si no la necesita puede tomar, Acetaminofen (Tylenol) o Ibuprofen (Advil) para aliviar dolor moderado. 2. Continue tomando el resto de sus medicinas. 3. Si necesita rellenar la receta, llame a la farmacia. ellos contactan a nuestra oficina pidiendo autorizacion. Este tipo de receta no pueden ser [employer] de las  5pm o [employer] fines de [place]. 4. Con relacion a la dieta: debe ser [employer] primeros dias despues que llege a la casa. Ejemplo: sopas y galleticas. Tome bastante liquido esos dias. 5. La mayoria de los pacientes padecen de inflamacion y cambio de coloracion de la piel alrededor de las incisiones. esto toma dias en resolver.  pnerse una bolsa de hielo en el area affectada ayuda..  6. Es comun tambien tener un [address]  de estrenimiento si esta tomado medicinas para el dolor. incremente la cantidad de liquidos a tomar y Engineer, production (Colace) esto previene el problema. Si ya tiene estrenimiento, es Designer, jewellery no ha defecado en 48 horas, puede tomar un laxativo (Milk of Magnesia or Miralax) uselo como el paquete le explica. 7.  A menos que se le diga algo diferente. Remueva el bendaje a las 24-48 horas despues dela [place]. y puede banarse en la ducha sin ningun problema. usted puede tener steri-strips (pequenas curitas transparentes en la piel puesta encima de la incision)  Estas banditas strips should be left on the skin for 7-10 days.   Si su cirujano puso pegamento encima de la incision usted puede banarse bajo la ducha en 24 horas. Este pegamento empezara a caerse en las proximas 2-3 semanas. Si le pusieron suturas o presillas  (grapos) estos seran quitados en su proxima cita en la oficina. [name] a. ACTIVIDADES:  Puede hacer actividad ligera.  Como caminar , subir escaleras y poco a poco irlas incrementando tanto como las [place]. Puede tener relaciones sexuales cuando sea comfortable. No carge objetos pesados o haga esfuerzos que no sean aprovados por su doctor. b. Puede manejar en cuanto no esta tomando medicamentos fuertes (narcoticos) para Chief Technology Officer, pueda abrochar confortablemente el cinturon de seguridad, y pueda Personnel officer y usar los pedales de su vehiculo con seguridad. c. PUEDE REGRESAR A TRABAJAR  8. Debe ver a su doctor para una cita de seguimiento en 2-3 semanas despues de la [place].  9. OTRAS ISNSTRUCCIONES:___________________________________________________________________________________ Debbora Lacrosse A SU MEDICO: 1. FIEBRE mayor de  101.0 2. No produccion de [place]. 3. Sangramiento continue de la herida 4. Incremento de dolor, enrojecimientio o drenaje de la herida (incision) 5. Incremento de dolor abdominal.  The clinic staff is available to answer your questions during regular business hours.  Please dont hesitate to call and ask to speak to one of the nurses for clinical concerns.  If you have a medical emergency, go to the nearest emergency room or call 911.  A surgeon from [facility] is always on call at the hospital. [address], [place], [place]  [address] ? [address] [address], [place], [place]   [address] [phone] ? [phone] ? FAX [phone] Web site: [email or web address]

## 2015-01-31 NOTE — Discharge Summary (Signed)
[facility] Discharge Summary   Patient ID: Thomas Ortiz MRN: [id number] DOB/AGE: 02-10-1956 59 y.o.  Admit date: 01/29/2015 Discharge date: 01/31/2015  Admitting Diagnosis: Chronic cholecystitis with calculus  Discharge Diagnosis Patient Active Problem List   Diagnosis Date Noted  . Gangrenous cholecystitis 01/30/2015  . Chronic cholecystitis with calculus 01/29/2015    Consultants None  Imaging: Dg Cholangiogram Operative  01/30/2015   CLINICAL DATA:  59 year old male with acute calculus cholecystitis  EXAM: INTRAOPERATIVE CHOLANGIOGRAM  TECHNIQUE: Cholangiographic images from the C-arm fluoroscopic device were submitted for interpretation post-operatively. Please see the procedural report for the amount of contrast and the fluoroscopy time utilized.  COMPARISON:  Abdominal ultrasound 01/29/2015  FINDINGS: Cine clip obtained during intraoperative cholangiogram at the time of laparoscopic cholecystectomy demonstrates cannulation of the cystic duct remanent and opacification of the biliary tree. No evidence of biliary ductal dilatation, stenosis, stricture or choledocholithiasis. Contrast material passes freely through the ampulla and into the duodenum. There is some extravasation of injected contrast material at the cystic duct stump.  IMPRESSION: 1. Negative intraoperative cholangiogram. 2. Incidental note is made of extravasation of injected contrast material from the cystic duct stump.   Electronically Signed   By: Thomas Ortiz M.D.   On: 01/30/2015 12:49   US Abdomen Complete  01/29/2015   CLINICAL DATA:  Acute onset of right upper quadrant abdominal pain and epigastric pain. Initial encounter.  EXAM: ULTRASOUND ABDOMEN COMPLETE  COMPARISON:  Abdominal ultrasound performed 01/22/2015  FINDINGS: Gallbladder: A 2.8 cm stone is seen lodged at the gallbladder neck. Several tiny stones and sludge are noted within the gallbladder fundus. The gallbladder wall is somewhat  thickened, measuring up to 3 mm. No ultrasonographic Murphy's sign is elicited, though pain is noted at the level of the gallbladder despite medication.  Common bile duct: Diameter: 0.6 cm, within normal limits in caliber.  Liver: No focal lesion identified. Within normal limits in parenchymal echogenicity.  IVC: No abnormality visualized.  Pancreas: Visualized portion unremarkable.  Spleen: Size and appearance within normal limits.  Right Kidney: Length: 10.5 cm. Echogenicity within normal limits. No mass or hydronephrosis visualized.  Left Kidney: Length: 10.4 cm. Echogenicity within normal limits. No mass or hydronephrosis visualized.  Abdominal aorta: No aneurysm visualized.  Other findings: None.  IMPRESSION: 2.8 cm stone lodged at the gallbladder neck, with mild gallbladder wall thickening. Tiny stones and sludge at the gallbladder fundus. This could reflect mild cholecystitis, given corresponding pain and tenderness at the level of the gallbladder despite medication. No definite ultrasonographic Murphy's sign is elicited.   Electronically Signed   By: Thomas Ortiz M.D.   On: 01/29/2015 21:10    Procedures Dr. Corliss Ortiz (01/30/15) - Laparoscopic Cholecystectomy with NEG [facility]   Hospital Course:  59 year old Hispanic male came back to the emergency room this evening complaining of recurrent right upper quadrant and epigastric pain. He had been in the emergency room on August 8 with similar complaints. His pain was generally occurring after eating meals. It would last for a few hours. It was often associated with nausea but no vomiting. He denies fevers and chills. He contacted our office to try to schedule appointment but reportedly had no money for co-pay. He denies any weight loss. He denies any melanoma, hematochezia, or acholic stools. He denies any chest pain or chest pressure. He denies any shortness of breath. He denies any alcohol or drug use. He denies any prior surgery  Workup showed likely acute  on chronic cholecystitis with  calculus.  Patient was admitted and underwent procedure listed above.  Tolerated procedure well and was transferred to the floor.  Was found to have gangrenous cholecystitis.  Diet was advanced as tolerated.  On POD #1, the patient was voiding well, tolerating diet, ambulating well, pain well controlled, vital signs stable, incisions c/d/i and felt stable for discharge home.  Patient will follow up in our office in 3 weeks and knows to call with questions or concerns.  He will continue antibiotics for 7 days (Augmentin).  Physical Exam: General:  Alert, NAD, pleasant, comfortable Abd:  Soft, ND, mild tenderness, incisions C/D/I    Medication List    STOP taking these medications        acetaminophen 325 MG tablet  Commonly known as:  TYLENOL      TAKE these medications        amoxicillin-clavulanate 875-125 MG per tablet  Commonly known as:  AUGMENTIN  Take 1 tablet by mouth 2 (two) times daily.     docusate sodium 100 MG capsule  Commonly known as:  COLACE  Take 1 capsule (100 mg total) by mouth 2 (two) times daily as needed for mild constipation.     oxyCODONE-acetaminophen 5-325 MG per tablet  Commonly known as:  PERCOCET/ROXICET  Take 1-2 tablets by mouth every 6 (six) hours as needed for moderate pain or severe pain.             Follow-up Information    Follow up with [facility] [place] On 02/20/2015.   Why:  For post-operation check. Your appointment is at 3:00pm, please arrive at least 30 min before your appointment to complete your check in paperwork.  If you are unable to arrive 30 min prior to your appointment time we may have to cancel or reschedule you   Contact information:   [address] [place] [address] [phone]      Signed: Nonie Ortiz, [facility] [phone]  01/31/2015, 8:02 AM

## 2015-04-01 ENCOUNTER — Emergency Department (HOSPITAL_COMMUNITY)
Admission: EM | Admit: 2015-04-01 | Discharge: 2015-04-01 | Disposition: A | Discharge status: Home / Self Care | Payer: Self-pay | Attending: Emergency Medicine | Admitting: Emergency Medicine

## 2015-04-01 ENCOUNTER — Encounter (HOSPITAL_COMMUNITY): Payer: Self-pay | Admitting: Nurse Practitioner

## 2015-04-01 DIAGNOSIS — M25561 Pain in right knee: Secondary | ICD-10-CM | POA: Insufficient documentation

## 2015-04-01 DIAGNOSIS — M25562 Pain in left knee: Secondary | ICD-10-CM | POA: Insufficient documentation

## 2015-04-01 DIAGNOSIS — Z792 Long term (current) use of antibiotics: Secondary | ICD-10-CM | POA: Insufficient documentation

## 2015-04-01 DIAGNOSIS — I10 Essential (primary) hypertension: Secondary | ICD-10-CM | POA: Insufficient documentation

## 2015-04-01 MED ORDER — OXYCODONE-ACETAMINOPHEN 5-325 MG PO TABS: 1.0000 | ORAL_TABLET | ORAL | Status: DC | PRN

## 2015-04-01 MED ORDER — INDOMETHACIN 25 MG PO CAPS: 25.0000 mg | ORAL_CAPSULE | Freq: Three times a day (TID) | ORAL | Status: DC | PRN

## 2015-04-01 MED ORDER — OXYCODONE-ACETAMINOPHEN 5-325 MG PO TABS
2.0000 | ORAL_TABLET | Freq: Once | ORAL | Status: AC
  Administered 2015-04-01: 2 via ORAL
  Filled 2015-04-01: qty 2

## 2015-04-01 NOTE — ED Notes (Signed)
Pt c/o 3 week history of bilateral knee pain, 3 day history bilateral knee pain and swelling. Pain is constant. He tried advil with minimal relief. Movement increases pain. A&Ox4, resp e/u

## 2015-04-01 NOTE — Care Management Note (Signed)
Case Management Note  Patient Details  Name: Thomas Ortiz MRN: [id number] Date of Birth: 05-18-1956  Subjective/Objective:      59 y.o. Uninsured M seen in the ED for Gout. Has been [facility] in the past with pt reporting he has been unable to complete paperwork. CM consulted by provider to [facility], and self pay resources.              Action/Plan: CM will call [facility] on Monday 04/02/2015 and get appt for Follow-up. Will call SpousePorfirio Mylar: [name] @ [phone] or Daughter: [name] @ [phone] with Date and time. Made referral to [facility] for followup  As pt continues to need assistance with [facility]CP and [facility].    Expected Discharge Date:                  Expected Discharge Plan:  Home/Self Care  In-House Referral:  PCP / [facility]  Discharge planning Services  [facility], [facility] (established/new)  Post Acute Care Choice:    Choice offered to:  Patient, Spouse [name]([name] at bedside (spouse) [phone])  DME Arranged:    DME Agency:     HH Arranged:    HH Agency:     Status of Service:  In process, will continue to follow  Medicare Important Message Given:    Date Medicare IM Given:    Medicare IM give by:    Date Additional Medicare IM Given:    Additional Medicare Important Message give by:     If discussed at Long Length of Stay Meetings, dates discussed:    Additional Comments:  Yvone NeuCrutchfield, [name], RN 04/01/2015, 12:43 PM

## 2015-04-01 NOTE — ED Provider Notes (Signed)
CSN: [id number]     Arrival date & time 04/01/15  1129 History  By signing my name below, I, Starleen Arms, attest that this documentation has been prepared under the direction and in the presence of Quincy Carnes, PA-C. Electronically Signed: Starleen Arms ED Scribe. 04/01/2015. 12:21 PM.    Chief Complaint  Patient presents with  . Joint Pain   HPI HPI Comments: Thomas Ortiz is a 59 y.o. male with hx of HTN, gout (untreated) who presents to the Emergency Department complaining of constant, moderate, left>right knee pain onset 3-4 days ago without fall or injury; worse with ROM; unrelieved by ibuprofen.  Today, he patient began to use a cane to ambulate due to pain.  He denies increased red meat, fish, or alcohol consumption  He reports hx of gout and some pain in the wrists and shoulders over the past several months that has resolved after time passed.  He denies hx of DM.  He does not currently take any maintenance medications for gout.  No fever, chills, sweats.  No hx DVT/PE.  VSS.  PCP: none  Past Medical History  Diagnosis Date  . Hypertension    Past Surgical History  Procedure Laterality Date  . Bilateral foot sx    . Cholecystectomy N/A 01/30/2015    Procedure: LAPAROSCOPIC CHOLECYSTECTOMY WITH INTRAOPERATIVE CHOLANGIOGRAM;  Surgeon: Donnie Mesa, MD;  Location: [place];  Service: General;  Laterality: N/A;   History reviewed. No pertinent family history. Social History  Substance Use Topics  . Smoking status: Never Smoker   . Smokeless tobacco: None  . Alcohol Use: 1.2 oz/week    2 Cans of beer per week     Comment: occasionally    Review of Systems  A complete 10 system review of systems was obtained and all systems are negative except as noted in the HPI and PMH.    Allergies  Review of patient's allergies indicates no known allergies.  Home Medications   Prior to Admission medications   Medication Sig Start Date End Date Taking? Authorizing Provider   amoxicillin-clavulanate (AUGMENTIN) 875-125 MG per tablet Take 1 tablet by mouth 2 (two) times daily. 01/31/15   Nat Christen, PA-C  docusate sodium (COLACE) 100 MG capsule Take 1 capsule (100 mg total) by mouth 2 (two) times daily as needed for mild constipation. 01/31/15   Nat Christen, PA-C  oxyCODONE-acetaminophen (PERCOCET/ROXICET) 5-325 MG per tablet Take 1-2 tablets by mouth every 6 (six) hours as needed for moderate pain or severe pain. 01/31/15   Megan N Baird, PA-C   BP 144/88 mmHg  Pulse 83  Temp(Src) 98.5 F (36.9 C) (Oral)  Resp 17  SpO2 97%   Physical Exam  Constitutional: He is oriented to person, place, and time. He appears well-developed and well-nourished. No distress.  HENT:  Head: Normocephalic and atraumatic.  Mouth/Throat: Oropharynx is clear and moist.  Eyes: Conjunctivae and EOM are normal. Pupils are equal, round, and reactive to light.  Neck: Normal range of motion.  Cardiovascular: Normal rate, regular rhythm and normal heart sounds.   Pulmonary/Chest: Effort normal and breath sounds normal. No respiratory distress. He has no wheezes.  Musculoskeletal: Normal range of motion.  Bilateral knees with diffuse mild swelling, left > right; decreased ROM due to pain but still able to flex/extend knees appropriately; skin is warm to touch without erythema, induration, cellulitis, or lymphangitis; no significant effusion or abscess noted; no calf asymmetry, tenderness, or palpable cords; legs are NVI; ambulating with cane  for support  Neurological: He is alert and oriented to person, place, and time.  Skin: Skin is warm and dry. He is not diaphoretic.  Psychiatric: He has a normal mood and affect.  Nursing note and vitals reviewed.   ED Course  Procedures (including critical care time)  DIAGNOSTIC STUDIES: Oxygen Saturation is 97% on RA, normal by my interpretation.    COORDINATION OF CARE:  12:21 PM Discussed treatment plan with patient at bedside.  Patient  acknowledges and agrees with plan.    Labs Review Labs Reviewed - No data to display  Imaging Review No results found.    EKG Interpretation None      MDM   Final diagnoses:  Knee pain, bilateral   59 year old male here with 3 week history of bilateral knee pain, worse over the past 3 days. He does have history of gout and intermittent pains of multiple joint areas. He is not currently on any maintenance medications for his gout. Patient is afebrile, nontoxic. He has bilateral swelling, left greater than right. Decreased range of motion due to pain, but still able to flex/extend normally. Legs are neurovascularly intact. No clinical signs of DVT. No reported fever or chills. At this time, doubt septic joint, fx, or dislocation. His symptoms may be due to his gout vs osteoarthritis.  Rx indomethacin and Percocet. Patient does not have current PCP, case management has met with patient and is arranging follow-up for him.  Discussed plan with patient, he/she acknowledged understanding and agreed with plan of care.  Return precautions given for new or worsening symptoms.  I personally performed the services described in this documentation, which was scribed in my presence. The recorded information has been reviewed and is accurate.  Larene Pickett, PA-C 04/01/15 1303  Veryl Speak, MD 04/01/15 [phone]

## 2015-04-01 NOTE — ED Notes (Signed)
Declined W/C at D/C and was escorted to lobby by RN. 

## 2015-04-01 NOTE — Discharge Instructions (Signed)
Take the prescribed medication as directed.  Avoid red meat, fish, alcohol, etc. That may worsen swelling. [facility]-- appointment is being arranged for you.  They should call you within the next 2 days. Return to the ED for new or worsening symptoms.

## 2015-04-11 ENCOUNTER — Ambulatory Visit: Payer: Self-pay | Attending: Internal Medicine

## 2015-05-14 ENCOUNTER — Ambulatory Visit: Payer: Self-pay | Attending: Family Medicine

## 2015-06-14 ENCOUNTER — Ambulatory Visit: Payer: Self-pay | Attending: Family Medicine | Admitting: Family Medicine

## 2015-06-14 ENCOUNTER — Encounter: Payer: Self-pay | Admitting: Family Medicine

## 2015-06-14 VITALS — BP 161/91 | HR 71 | Temp 98.2°F | Resp 16 | Ht 64.0 in | Wt 156.0 lb

## 2015-06-14 DIAGNOSIS — Z114 Encounter for screening for human immunodeficiency virus [HIV]: Secondary | ICD-10-CM | POA: Insufficient documentation

## 2015-06-14 DIAGNOSIS — I10 Essential (primary) hypertension: Secondary | ICD-10-CM | POA: Insufficient documentation

## 2015-06-14 DIAGNOSIS — Z1159 Encounter for screening for other viral diseases: Secondary | ICD-10-CM | POA: Insufficient documentation

## 2015-06-14 DIAGNOSIS — Z0000 Encounter for general adult medical examination without abnormal findings: Secondary | ICD-10-CM | POA: Insufficient documentation

## 2015-06-14 DIAGNOSIS — M2550 Pain in unspecified joint: Secondary | ICD-10-CM | POA: Insufficient documentation

## 2015-06-14 LAB — CBC
HCT: 39.1 % (ref 39.0–52.0)
Hemoglobin: 13 g/dL (ref 13.0–17.0)
MCH: 29.3 pg (ref 26.0–34.0)
MCHC: 33.2 g/dL (ref 30.0–36.0)
MCV: 88.1 fL (ref 78.0–100.0)
MPV: 9.6 fL (ref 8.6–12.4)
PLATELETS: 393 10*3/uL (ref 150–400)
RBC: 4.44 MIL/uL (ref 4.22–5.81)
RDW: 14.8 % (ref 11.5–15.5)
WBC: 7.7 10*3/uL (ref 4.0–10.5)

## 2015-06-14 MED ORDER — ACETAMINOPHEN-CODEINE #3 300-30 MG PO TABS: 1.0000 | ORAL_TABLET | Freq: Three times a day (TID) | ORAL | Status: DC | PRN

## 2015-06-14 MED ORDER — INDOMETHACIN 25 MG PO CAPS: 25.0000 mg | ORAL_CAPSULE | Freq: Three times a day (TID) | ORAL | Status: DC | PRN

## 2015-06-14 MED ORDER — AMLODIPINE BESYLATE 5 MG PO TABS: 5.0000 mg | ORAL_TABLET | Freq: Every day | ORAL | Status: DC

## 2015-06-14 MED ORDER — ACETAMINOPHEN-CODEINE #3 300-30 MG PO TABS
1.0000 | ORAL_TABLET | Freq: Three times a day (TID) | ORAL | Status: DC | PRN
Start: 2015-06-14 — End: 2015-07-05

## 2015-06-14 NOTE — Patient Instructions (Addendum)
Thomas Ortiz was seen today for hypertension and joint pain.  Diagnoses and all orders for this visit:  Essential hypertension -     amLODipine (NORVASC) 5 MG tablet; Take 1 tablet (5 mg total) by mouth daily.  Joint pain -     Uric Acid -     COMPLETE METABOLIC PANEL WITH GFR -     CBC -     indomethacin (INDOCIN) 25 MG capsule; Take 1 capsule (25 mg total) by mouth 3 (three) times daily as needed. -     acetaminophen-codeine (TYLENOL #3) 300-30 MG tablet; Take 1 tablet by mouth every 8 (eight) hours as needed for moderate pain.  Screening for HIV (human immunodeficiency virus) -     HIV antibody (with reflex)  Need for hepatitis C screening test -     Hepatitis C antibody, reflex    Gout suspected  Checking labs to confirm Will treat current palin with indocoin and tylenol #3  If uric acid is high you will need to start allopurinol to lower it once pain is down  F/u in 3 weeks for joint pain and HTN  Dr. Armen Pickup   Gota  (Gout)  La gota es una artritis inflamatoria originada por la acumulacin de cristales de cido rico en las articulaciones. El cido rico es una sustancia qumica que normalmente se encuentra en la [place]. Cuando los niveles de cido rico en la sangre son muy elevados, pueden formarse cristales que se depositan en las articulaciones y los tejidos. Esto causa irritacin, dolor e hinchazn (inflamacin). La repeticin de los ataques es frecuente. Con el tiempo, los cristales de cido rico pueden formar [employer] (tofos) cerca de Risk analyst, destruyen el [place] y provocan una deformidad La gota es una enfermedad que puede tratarse y con frecuencia puede prevenirse. CAUSAS  La enfermedad comienza con niveles altos de cido rico en la sangre. El organismo produce cido rico cuando metaboliza una sustancia que se encuentra en [place] natural, denominada purina. Ciertos alimentos, como carnes y pescado, contienen grandes cantidades de purinas. Las causas de cido rico  elevado son:   Ser transmitida de padres a hijos (hereditaria).  Enfermedades que causan un aumento de la produccin de cido rico (como la obesidad, la psoriasis y ciertos tipos de Database administrator).  Abuso en el consumo de bebidas alcohlicas.  La dieta, especialmente las [employer] se consume [place] carne y frutos de mar.  Ciertos medicamentos, como los que combaten el cncer (quimioterapia), los diurticos y la aspirina.  Enfermedades renales crnicas. Los riones no pueden eliminar bien el cido rico.  Problemas con el metabolismo. Las enfermedades fuertemente asociadas a la gota son:   Janene Harvey.  Hipertensin arterial.  Colesterol elevado.  Diabetes. No todas las personas con niveles elevados de cido rico sufren gota. No se comprende an por qu algunas personas padecen gota y otras no. Las [place], lesiones en una articulacin y el consumo excesivo de ciertos alimentos son algunos de los factores que pueden provocar ataques de [place].  SNTOMAS   Un ataque de gota puede comenzar rpidamente. Causa un dolor intenso, con irritacin, hinchazn y calor en una articulacin.  Puede haber fiebre.  Generalmente slo una articulacin es [place]. Ciertas articulaciones se ven implicadas con ms frecuencia.  La base del dedo gordo del pie.  La rodilla.  El tobillo.  Webb Laws.  Un dedo. Sin tratamiento, un ataque por lo general desaparece en unos pocos das o semanas. Entre un ataque y [place], por lo general  no hay sntomas, lo cual es diferente de Atkinsportmuchas otras formas de artritis.  DIAGNSTICO  El profesional reunir la informacin basndose en los sntomas y el examen fsico. En algunos casos indicar estudios. Estos estudios pueden ser:   Anlisis de [place]angre.  Anlisis de [place]orina.  Radiografas.  Anlisis de los fluidos de Nurse, learning disabilityla articulacin. En este estudio se necesita una aguja para extraer lquido de la articulacin (artrocentesis). Con el uso del microscopio, se confirma  la gota cuando se observan los cristales de cido rico en el lquido de Nurse, learning disabilityla articulacin. TRATAMIENTO  ONEOKHay dos fases en el tratamiento de la gota: el tratamiento del ataque de aparicin repentina (agudo) y la prevencin de los ataques (profilaxis).   Tratamiento de un ataque agudo.  Se utilizan medicamentos. Se indican antiinflamatorios o corticoides.  En algunos casos es necesario inyectar un corticoide en la articulacin afectada.  La articulacin dolorosa se pone en reposo. El movimiento puede empeorar la artritis.  Puede utilizar tanto tratamientos con calor o con fro para Scientific laboratory technicianaliviar el dolor en las articulaciones, segn lo que le haga mejor.  Tratamiento para prevenir ataques.  Si sufre de ataques de gota frecuentes, el mdico puede recomendarle medicamentos preventivos. Estos medicamentos se administran despus de que el ataque agudo Bethelmejora. Estos medicamentos ayudan a los riones a Landeliminar el cido rico del organismo o a Conservator, museum/gallerydisminuir la produccin de cido rico. Es posible que deba Chemical engineerutilizar estos medicamentos durante un largo Surf Citytiempo.  La primera fase del tratamiento preventiva puede asociarse con un aumento de los ataques agudos de [place]. Por esta razn, durante los primeros meses de St. Martinstratamiento, Oregonsu mdico puede tambin aconsejarle que tome medicamentos que habitualmente se La[place] People's Democratic Republicutilizan para el tratamiento de la gota aguda. Asegrese de comprender todas las indicaciones del mdico. El mdico podr hacer varios ajustes a la dosis del medicamento antes de que comiencen a ser Geologist, engineeringefectivos.  Comente el tratamiento diettico con su mdico o nutricionista. El alcohol y las bebidas que contienen gran cantidad de International aid/development workerazcar y fructosa y los alimentos como la carne, el pollo y los frutos de mar pueden aumentar los niveles de cido rico. El mdico o el nutricionista podr Comcastaconsejarlo sobre las bebidas y los alimentos que debe limitar. INSTRUCCIONES PARA EL CUIDADO EN EL HOGAR   No tome aspirina para el dolor.  Esto eleva los niveles de cido rico.  Slo tome medicamentos de venta libre o prescriptos para Primary school teachercalmar el dolor, las molestias o Publishing copybajar la fiebre segn las indicaciones de su mdico.  Sports administratorHaga reposo todo el tiempo que pueda. Cuando se encuentre en la cama, mantenga las sbanas y [address] Stmantas alejadas de las articulaciones doloridas.  Mantenga la articulacin afectada levantada (elevada).  Aplique compresas fras o calientes sobre las articulaciones doloridas. el uso de compresas calientes o fras depende de lo que TXU Corpmejor le resulte a usted.  Utilice muletas si la articulacin que le duele es de la pierna.  Debe ingerir gran cantidad de lquido para mantener la orina de tono claro o color amarillo plido. Esto ayudar a que el organismo elimine el cido rico. Limite el consumo de alcohol, bebidas azucaradas y que contengan fructosa.  Siga las indicaciones con respecto a la dieta. Preste especial atencin a la cantidad de protenas que consume. En la dieta diaria debe enfatizar el consumo de frutas, vegetales, granos enteros y productos lcteos descremados. Comente con su mdico o nutricionista acerca del consumo de caf, vitamina C o cerezas. Estos pueden ayudar a Bear Stearnsdisminuir los niveles de cido rico.  [place] ShoalsMantenga  un peso corporal adecuado. SOLICITE ATENCIN MDICA SI:   Tiene diarrea, vmitos o algn efecto secundario provocado por los medicamentos.  No se siente mejor en 24 horas, o empeora. SOLICITE ATENCIN MDICA DE INMEDIATO SI:   Las articulaciones le duelen ms de manera repentina, tiene escalofros o fiebre. ASEGRESE DE QUE:   Comprende estas instrucciones.  Controlar su enfermedad.  Solicitar ayuda de inmediato si no mejora o si empeora.   Esta informacin no tiene Theme park manager el consejo del mdico. Asegrese de hacerle al mdico cualquier pregunta que tenga.   Document Released: 03/12/2005 Document Revised: 09/27/2012 Elsevier Interactive Patient Education [employer].

## 2015-06-14 NOTE — Progress Notes (Signed)
Patient ID: Thomas Ortiz, male   DOB: 03/20/1956, 59 y.o.   MRN: [id number]   Subjective:  Patient ID: Thomas Ortiz, male    DOB: 03/20/1956  Age: 59 y.o. MRN: [id number]  CC: Hypertension and Joint Pain   HPI Thomas Ortiz presents for   1. HTN: dx in 2007. No treatment. No HA, CP, SOB or leg swelling.   2. Joint pain: x 4 months in shoulder, fingers. Knees, ankles and great toe. Has swelling at both 2nd MTP joints. Went to ED and was treated with indocin and percocet for likely gout. Pain is moderate to severe. He walks with a cane since his b/l lower leg/ankle fractures at work in 2011.   Past Medical History  Diagnosis Date  . Hypertension 2007    Past Surgical History  Procedure Laterality Date  . Bilateral foot sx    . Cholecystectomy N/A 01/30/2015    Procedure: LAPAROSCOPIC CHOLECYSTECTOMY WITH INTRAOPERATIVE CHOLANGIOGRAM;  Surgeon: Manus RuddMatthew Tsuei, MD;  Location: [facility] OR;  Service: General;  Laterality: N/A;    History reviewed. No pertinent family history.  Social History  Substance Use Topics  . Smoking status: Never Smoker   . Smokeless tobacco: Not on file  . Alcohol Use: 1.2 oz/week    2 Cans of beer per week     Comment: occasionally    ROS Review of Systems  Constitutional: Negative for fever, chills, fatigue and unexpected weight change.  Eyes: Negative for visual disturbance.  Respiratory: Negative for cough and shortness of breath.   Cardiovascular: Negative for chest pain, palpitations and leg swelling.  Gastrointestinal: Negative for nausea, vomiting, abdominal pain, diarrhea, constipation and blood in stool.  Endocrine: Negative for polydipsia, polyphagia and polyuria.  Musculoskeletal: Positive for joint swelling and arthralgias. Negative for myalgias, back pain, gait problem and neck pain.  Skin: Negative for rash.  Allergic/Immunologic: Negative for immunocompromised state.  Hematological: Negative for adenopathy. Does not bruise/bleed easily.    Psychiatric/Behavioral: Negative for suicidal ideas, sleep disturbance and dysphoric mood. The patient is not nervous/anxious.    Objective:   Today's Vitals: BP 161/91 mmHg  Pulse 71  Temp(Src) 98.2 F (36.8 C) (Oral)  Resp 16  Ht 5\' 4"  (1.626 m)  Wt 156 lb (70.761 kg)  BMI 26.76 kg/m2  SpO2 100%  Physical Exam  Constitutional: He appears well-developed and well-nourished. No distress.  HENT:  Head: Normocephalic and atraumatic.  Neck: Normal range of motion. Neck supple.  Cardiovascular: Normal rate, regular rhythm, normal heart sounds and intact distal pulses.   Pulmonary/Chest: Effort normal and breath sounds normal.  Musculoskeletal: He exhibits no edema.  Dec ROM in shoulder Tophi at 2nd MCPs both hands Decreased ROM and healed surgical scars at anterior ankles Tenderness and swelling at both first MTP joints   Neurological: He is alert.  Skin: Skin is warm and dry. No rash noted. No erythema.  Psychiatric: He has a normal mood and affect.    Assessment & Plan:   No problem-specific assessment & plan notes found for this encounter.   Outpatient Encounter Prescriptions as of 06/14/2015  Medication Sig  . [DISCONTINUED] ibuprofen (ADVIL,MOTRIN) 200 MG tablet Take 200 mg by mouth every 6 (six) hours as needed.  [name]. acetaminophen-codeine (TYLENOL #3) 300-30 MG tablet Take 1 tablet by mouth every 8 (eight) hours as needed for moderate pain.  [name]. amLODipine (NORVASC) 5 MG tablet Take 1 tablet (5 mg total) by mouth daily.  . indomethacin (INDOCIN) 25 MG capsule Take 1 capsule (  25 mg total) by mouth 3 (three) times daily as needed.  . [DISCONTINUED] amoxicillin-clavulanate (AUGMENTIN) 875-125 MG per tablet Take 1 tablet by mouth 2 (two) times daily. (Patient not taking: Reported on 06/14/2015)  . [DISCONTINUED] docusate sodium (COLACE) 100 MG capsule Take 1 capsule (100 mg total) by mouth 2 (two) times daily as needed for mild constipation. (Patient not taking: Reported on  06/14/2015)  . [DISCONTINUED] indomethacin (INDOCIN) 25 MG capsule Take 1 capsule (25 mg total) by mouth 3 (three) times daily as needed. (Patient not taking: Reported on 06/14/2015)  . [DISCONTINUED] oxyCODONE-acetaminophen (PERCOCET/ROXICET) 5-325 MG tablet Take 1 tablet by mouth every 4 (four) hours as needed. (Patient not taking: Reported on 06/14/2015)   No facility-administered encounter medications on file as of 06/14/2015.    Follow-up: No Follow-up on file.    Dessa Phi MD

## 2015-06-14 NOTE — Assessment & Plan Note (Signed)
A; HTN Meds: none P: norvasc 5 mg daily

## 2015-06-14 NOTE — Assessment & Plan Note (Signed)
A: suspect gout P: Uric acid Indocin Tylenol #3 Plan to start allopurinol if uric acid elevated once pain better controlled

## 2015-06-14 NOTE — Progress Notes (Signed)
Establish care  Join and muscle pain, stated pain started after gallbladder removed  Frequent urination  Pain scale # 10 Tobacco user  No suicidal thought in the past two weeks

## 2015-06-15 LAB — COMPLETE METABOLIC PANEL WITH GFR
ALBUMIN: 4 g/dL (ref 3.6–5.1)
ALK PHOS: 85 U/L (ref 40–115)
ALT: 7 U/L — ABNORMAL LOW (ref 9–46)
AST: 11 U/L (ref 10–35)
BILIRUBIN TOTAL: 0.4 mg/dL (ref 0.2–1.2)
BUN: 15 mg/dL (ref 7–25)
CO2: 27 mmol/L (ref 20–31)
Calcium: 9.4 mg/dL (ref 8.6–10.3)
Chloride: 104 mmol/L (ref 98–110)
Creat: 1 mg/dL (ref 0.70–1.25)
GFR, EST NON AFRICAN AMERICAN: 81 mL/min (ref >=60)
GFR, Est African American: >89 mL/min (ref >=60)
Glucose, Bld: 100 mg/dL — ABNORMAL HIGH (ref 65–99)
Potassium: 4 mmol/L (ref 3.5–5.3)
SODIUM: 142 mmol/L (ref 135–146)
TOTAL PROTEIN: 7.6 g/dL (ref 6.1–8.1)

## 2015-06-15 LAB — URIC ACID: URIC ACID, SERUM: 7.2 mg/dL (ref 4.0–7.8)

## 2015-06-15 LAB — HEPATITIS C ANTIBODY: HCV AB: NEGATIVE

## 2015-06-15 LAB — HIV ANTIBODY (ROUTINE TESTING W REFLEX): HIV: NONREACTIVE

## 2015-06-20 ENCOUNTER — Telehealth: Payer: Self-pay | Admitting: *Deleted

## 2015-06-20 NOTE — Telephone Encounter (Signed)
-----   Message from Dessa PhiJosalyn Funches, MD sent at 06/15/2015  8:46 AM EST ----- Labs all normal Keep f/u to recheck arthritis and blood pressure

## 2015-06-20 NOTE — Telephone Encounter (Signed)
Date of birth verified by pt  Normal Lab results given  F/U appointment on Jan 19  Pt verbalized understanding

## 2015-07-05 ENCOUNTER — Ambulatory Visit (HOSPITAL_COMMUNITY)
Admission: RE | Admit: 2015-07-05 | Discharge: 2015-07-05 | Disposition: A | Discharge status: Home / Self Care | Payer: Self-pay | Source: Ambulatory Visit | Attending: Family Medicine | Admitting: Family Medicine

## 2015-07-05 ENCOUNTER — Ambulatory Visit: Payer: Self-pay | Attending: Family Medicine | Admitting: Family Medicine

## 2015-07-05 ENCOUNTER — Encounter: Payer: Self-pay | Admitting: Family Medicine

## 2015-07-05 VITALS — BP 150/93 | HR 76 | Temp 98.2°F | Resp 16 | Ht 64.0 in | Wt 156.0 lb

## 2015-07-05 DIAGNOSIS — M130 Polyarthritis, unspecified: Secondary | ICD-10-CM | POA: Insufficient documentation

## 2015-07-05 DIAGNOSIS — Z23 Encounter for immunization: Secondary | ICD-10-CM

## 2015-07-05 DIAGNOSIS — M2340 Loose body in knee, unspecified knee: Secondary | ICD-10-CM | POA: Insufficient documentation

## 2015-07-05 DIAGNOSIS — M25561 Pain in right knee: Secondary | ICD-10-CM | POA: Insufficient documentation

## 2015-07-05 DIAGNOSIS — M25562 Pain in left knee: Secondary | ICD-10-CM | POA: Insufficient documentation

## 2015-07-05 DIAGNOSIS — M79669 Pain in unspecified lower leg: Secondary | ICD-10-CM | POA: Insufficient documentation

## 2015-07-05 DIAGNOSIS — M79641 Pain in right hand: Secondary | ICD-10-CM | POA: Insufficient documentation

## 2015-07-05 DIAGNOSIS — Z0000 Encounter for general adult medical examination without abnormal findings: Secondary | ICD-10-CM

## 2015-07-05 DIAGNOSIS — M79642 Pain in left hand: Secondary | ICD-10-CM | POA: Insufficient documentation

## 2015-07-05 DIAGNOSIS — I739 Peripheral vascular disease, unspecified: Secondary | ICD-10-CM | POA: Insufficient documentation

## 2015-07-05 DIAGNOSIS — I10 Essential (primary) hypertension: Secondary | ICD-10-CM | POA: Insufficient documentation

## 2015-07-05 DIAGNOSIS — M170 Bilateral primary osteoarthritis of knee: Secondary | ICD-10-CM | POA: Insufficient documentation

## 2015-07-05 LAB — RHEUMATOID FACTOR

## 2015-07-05 LAB — POCT GLYCOSYLATED HEMOGLOBIN (HGB A1C): HEMOGLOBIN A1C: 5.7

## 2015-07-05 LAB — C-REACTIVE PROTEIN: CRP: 1.3 mg/dL — ABNORMAL HIGH (ref <0.60)

## 2015-07-05 MED ORDER — ACETAMINOPHEN-CODEINE #3 300-30 MG PO TABS
1.0000 | ORAL_TABLET | Freq: Three times a day (TID) | ORAL | Status: DC | PRN
Start: 2015-07-05 — End: 2015-12-11

## 2015-07-05 MED ORDER — INDOMETHACIN 25 MG PO CAPS: 25.0000 mg | ORAL_CAPSULE | Freq: Two times a day (BID) | ORAL | Status: DC | PRN

## 2015-07-05 MED ORDER — AMLODIPINE BESYLATE 10 MG PO TABS: 10.0000 mg | ORAL_TABLET | Freq: Every day | ORAL | Status: DC

## 2015-07-05 MED FILL — AMLODIPINE BESYLATE 10 MG T: 10 | 30 days supply | Qty: 30 | Fill #0

## 2015-07-05 MED FILL — INDOMETHACIN 25 MG CAPSULE: 25 | 15 days supply | Qty: 30 | Fill #0

## 2015-07-05 NOTE — Progress Notes (Signed)
F/U HTN, leg pain  Still with leg pain, from knee to toe bilateral possible form old Fx  Unable to walk, feeling needle like pain  Pain scale # 8 No tobacco user  No suicidal thought in the past two week  No BP medication x 1 week

## 2015-07-05 NOTE — Assessment & Plan Note (Signed)
A: improved BP Med: compliant P: increase norvasc to 10 mg daily

## 2015-07-05 NOTE — Assessment & Plan Note (Signed)
A:  Rapidly progressive polyarthritis. Uric acid test was normal. Gout vs pseudogout still possible. OA likely. RA possible.  P: Labs per orders X-rays per orders Rheumatology referral

## 2015-07-05 NOTE — Patient Instructions (Addendum)
Thomas Ortiz was seen today for hypertension and leg pain.  Diagnoses and all orders for this visit:  Essential hypertension -     amLODipine (NORVASC) 10 MG tablet; Take 1 tablet (10 mg total) by mouth daily.  Healthcare maintenance -     POCT glycosylated hemoglobin (Hb A1C) -     Ambulatory referral to Gastroenterology  Polyarthritis -     acetaminophen-codeine (TYLENOL #3) 300-30 MG tablet; Take 1 tablet by mouth every 8 (eight) hours as needed for moderate pain. -     DG Knee Bilateral Standing AP; Future -     DG Hand Complete Right; Future -     DG Hand Complete Left; Future -     indomethacin (INDOCIN) 25 MG capsule; Take 1 capsule (25 mg total) by mouth 2 (two) times daily as needed for moderate pain. -     Sedimentation Rate -     Rheumatoid factor -     C Reactive Protein, Fluid -     Ambulatory referral to Rheumatology   You will be called with lab and x-ray results  F/u in 4 weeks for pharmacy BP check  F/u in 8 weeks for arthritis   Dr. Armen Pickup

## 2015-07-05 NOTE — Progress Notes (Signed)
Patient ID: Thomas Ortiz, male   DOB: [birth year], 60 y.o.   MRN: [id number]   Subjective:  Patient ID: Thomas Ortiz, male    DOB: Feb 22, 1956  Age: 60 y.o. MRN: [id number]  CC: Hypertension and Leg Pain   HPI Thomas Ortiz presents for   1. CHRONIC HYPERTENSION  Disease Monitoring  Blood pressure range: not checking   Chest pain: no   Dyspnea: no   Claudication: no   Medication compliance: yes  Medication Side Effects  Lightheadedness: no   Urinary frequency: no   Edema: no   Impotence: no   2. Joint pain: improved L shoulder. Still with pain and swelling in joints of hands. Also in knees. Indocin and tylenol #3 helped. Of note, joint pain and swelling started acutely after gallbladder surgery in 01/2015.   Past Surgical History  Procedure Laterality Date  . Bilateral foot sx  2011  . Cholecystectomy N/A 01/30/2015    Procedure: LAPAROSCOPIC CHOLECYSTECTOMY WITH INTRAOPERATIVE CHOLANGIOGRAM;  Surgeon: Manus Rudd, MD;  Location: [facility] OR;  Service: General;  Laterality: N/A;     Social History  Substance Use Topics  . Smoking status: Never Smoker   . Smokeless tobacco: Not on file  . Alcohol Use: 1.2 oz/week    2 Cans of beer per week     Comment: occasionally    Outpatient Prescriptions Prior to Visit  Medication Sig Dispense Refill  . acetaminophen-codeine (TYLENOL #3) 300-30 MG tablet Take 1 tablet by mouth every 8 (eight) hours as needed for moderate pain. 60 tablet 0  . amLODipine (NORVASC) 5 MG tablet Take 1 tablet (5 mg total) by mouth daily. 90 tablet 3  . indomethacin (INDOCIN) 25 MG capsule Take 1 capsule (25 mg total) by mouth 3 (three) times daily as needed. 30 capsule 0   No facility-administered medications prior to visit.    ROS Review of Systems  Constitutional: Negative for fever, chills, fatigue and unexpected weight change.  Eyes: Negative for visual disturbance.  Respiratory: Negative for cough and shortness of breath.   Cardiovascular: Negative  for chest pain, palpitations and leg swelling.  Gastrointestinal: Negative for nausea, vomiting, abdominal pain, diarrhea, constipation and blood in stool.  Endocrine: Negative for polydipsia, polyphagia and polyuria.  Musculoskeletal: Positive for joint swelling and arthralgias. Negative for myalgias, back pain, gait problem and neck pain.  Skin: Negative for rash.  Allergic/Immunologic: Negative for immunocompromised state.  Hematological: Negative for adenopathy. Does not bruise/bleed easily.  Psychiatric/Behavioral: Negative for suicidal ideas, sleep disturbance and dysphoric mood. The patient is not nervous/anxious.     Objective:  BP 150/93 mmHg  Pulse 76  Temp(Src) 98.2 F (36.8 C) (Oral)  Resp 16  Ht  (1.626 m)  Wt 156 lb (70.761 kg)  BMI 26.76 kg/m2  SpO2 98%  BP/Weight 07/05/2015 06/14/2015 04/01/2015  Systolic BP 150 161 124  Diastolic BP 93 91 85  Wt. (Lbs) 156 156 -  BMI 26.76 26.76 -   Physical Exam  Constitutional: He appears well-developed and well-nourished. No distress.  HENT:  Head: Normocephalic and atraumatic.  Neck: Normal range of motion. Neck supple.  Cardiovascular: Normal rate, regular rhythm, normal heart sounds and intact distal pulses.   Pulmonary/Chest: Effort normal and breath sounds normal.  Musculoskeletal: He exhibits no edema.  Swelling at 2nd MCPs both hands, no deviation  Decreased ROM and healed surgical scars at anterior ankles Tenderness and swelling at both first MTP joints   Neurological: He is alert.  Skin: Skin is  warm and dry. No rash noted. No erythema.  Psychiatric: He has a normal mood and affect.    Lab Results  Component Value Date   HGBA1C 5.70 07/05/2015    Assessment & Plan:   Problem List Items Addressed This Visit    HTN (hypertension) - Primary (Chronic)    A: improved BP Med: compliant P: increase norvasc to 10 mg daily       Relevant Medications   amLODipine (NORVASC) 10 MG tablet   Polyarthritis  (Chronic)    A:  Rapidly progressive polyarthritis. Uric acid test was normal. Gout vs pseudogout still possible. OA likely. RA possible.  P: Labs per orders X-rays per orders Rheumatology referral         Relevant Medications   acetaminophen-codeine (TYLENOL #3) 300-30 MG tablet   indomethacin (INDOCIN) 25 MG capsule   Other Relevant Orders   DG Knee Bilateral Standing AP (Completed)   DG Hand Complete Right (Completed)   DG Hand Complete Left (Completed)   Sedimentation Rate   Rheumatoid factor   Ambulatory referral to Rheumatology   C-reactive protein    Other Visit Diagnoses    Healthcare maintenance        Relevant Orders    POCT glycosylated hemoglobin (Hb A1C) (Completed)    Ambulatory referral to Gastroenterology       No orders of the defined types were placed in this encounter.    Follow-up: No Follow-up on file.   Dessa Phi MD

## 2015-07-06 LAB — SEDIMENTATION RATE: SED RATE: 36 mm/h — AB (ref 0–20)

## 2015-07-06 MED FILL — ACETAMINOPHEN/COD #3 TABLET: 300-30 | 20 days supply | Qty: 60 | Fill #0

## 2015-07-09 ENCOUNTER — Telehealth: Payer: Self-pay | Admitting: *Deleted

## 2015-07-09 NOTE — Telephone Encounter (Signed)
-----   Message from Dessa Phi, MD sent at 07/05/2015 11:53 AM EST ----- Non specific degeneration in hands and wrist  Suspect primary osteoarthritis Rheumatology referral still recommended given acute and rapid onset of arthritis

## 2015-07-09 NOTE — Telephone Encounter (Signed)
-----   Message from Dessa Phi, MD sent at 07/06/2015  8:35 AM EST ----- Sed rate and CRP are high, these are non specific markers of inflammation Rheumatoid factor is negative Continue treatment plan  Patient has been referred to rheumatology

## 2015-07-09 NOTE — Telephone Encounter (Signed)
Date of birth verified by pt Lab and Xray results given  Notified Rheumatology referral placed  Continue with Tx as planned

## 2015-07-09 NOTE — Telephone Encounter (Signed)
-----   Message from Dessa Phi, MD sent at 07/05/2015 11:53 AM EST ----- Non specific degeneration in knees Suspect primary osteoarthritis Rheumatology referral still recommended given acute and rapid onset of arthritis

## 2015-07-11 ENCOUNTER — Telehealth: Payer: Self-pay | Admitting: Family Medicine

## 2015-07-25 ENCOUNTER — Telehealth: Payer: Self-pay

## 2015-07-25 NOTE — Telephone Encounter (Signed)
Pt called back this afternoon asking for DS. I told him that DS would try to call him back around 4pm

## 2015-07-25 NOTE — Telephone Encounter (Signed)
Pt called to speak with DS. He received a letter. Please call him at [phone]

## 2015-07-25 NOTE — Telephone Encounter (Signed)
See separate triage.  

## 2015-07-26 ENCOUNTER — Ambulatory Visit: Payer: Self-pay | Attending: Family Medicine | Admitting: Pharmacist

## 2015-07-26 VITALS — BP 118/80 | HR 83

## 2015-07-26 DIAGNOSIS — Z79899 Other long term (current) drug therapy: Secondary | ICD-10-CM | POA: Insufficient documentation

## 2015-07-26 DIAGNOSIS — I10 Essential (primary) hypertension: Secondary | ICD-10-CM | POA: Insufficient documentation

## 2015-07-26 NOTE — Patient Instructions (Signed)
Thanks for coming to see me!  Your blood pressure looks great!  Follow up with Dr. Funches as directed 

## 2015-07-26 NOTE — Telephone Encounter (Signed)
Gastroenterology Pre-Procedure Review  Request Date:07/25/2015 Requesting Physician: Dessa Phi  PATIENT REVIEW QUESTIONS: The patient responded to the following health history questions as indicated:      1. Diabetes Melitis: no 2. Joint replacements in the past 12 months: no 3. Major health problems in the past 3 months: no 4. Has an artificial valve or MVP: no 5. Has a defibrillator: no 6. Has been advised in past to take antibiotics in advance of a procedure like teeth cleaning: no 7. Family history of colon cancer: no  8. Alcohol Use: NOT IN OVER A YEAR 9. History of sleep apnea: no     MEDICATIONS & ALLERGIES:    Patient reports the following regarding taking any blood thinners:   Plavix? no Aspirin? no Coumadin? no  Patient confirms/reports the following medications:  Current Outpatient Prescriptions  Medication Sig Dispense Refill  . acetaminophen-codeine (TYLENOL #3) 300-30 MG tablet Take 1 tablet by mouth every 8 (eight) hours as needed for moderate pain. 60 tablet 2  . amLODipine (NORVASC) 10 MG tablet Take 1 tablet (10 mg total) by mouth daily. 30 tablet 5  . indomethacin (INDOCIN) 25 MG capsule Take 1 capsule (25 mg total) by mouth 2 (two) times daily as needed for moderate pain. 30 capsule 0   No current facility-administered medications for this visit.    Patient confirms/reports the following allergies:  No Known Allergies  No orders of the defined types were placed in this encounter.    AUTHORIZATION INFORMATION Primary Insurance:   ID #:   Group #:  Pre-Cert / Auth required:  Pre-Cert / Auth #:   Secondary Insurance:   ID #:   Group #:  Pre-Cert / Auth required:  Pre-Cert / Auth #:   SCHEDULE INFORMATION: Procedure has been scheduled as follows:  Date:                 Time:   Location:   This Gastroenterology Pre-Precedure Review Form is being routed to the following provider(s): Jonette Eva, MD

## 2015-07-26 NOTE — Progress Notes (Signed)
S:    Patient arrives in good spirits.    Presents to the clinic for hypertension evaluation. Patient was referred on 07/05/15 by Dr. Armen Pickup.  Patient was last seen by Primary Care Provider on 07/05/15.   Patient reports adherence with medications. He reports that his last dose of amlodipine was last night.  Current BP Medications include:  Amlodipine  daily.     O:   Last 3 Office BP readings: BP Readings from Last 3 Encounters:  07/05/15 150/93  06/14/15 161/91  04/01/15 124/85    BMET    Component Value Date/Time   NA 142 06/14/2015 1614   K 4.0 06/14/2015 1614   CL 104 06/14/2015 1614   CO2 27 06/14/2015 1614   GLUCOSE 100* 06/14/2015 1614   BUN 15 06/14/2015 1614   CREATININE 1.00 06/14/2015 1614   CREATININE 0.96 01/29/2015 1752   CALCIUM 9.4 06/14/2015 1614   GFRNONAA 81 06/14/2015 1614   GFRNONAA >60 01/29/2015 1752   GFRAA >89 06/14/2015 1614   GFRAA >60 01/29/2015 1752    A/P: Hypertension currently controlled on current medications.  Continued amlodipine 10 mg daily. Patient is tolerating the medication well. No other issues noted.  Results reviewed and written information provided.   Total time in face-to-face counseling 10 minutes.   F/U Clinic Visit with Dr. Armen Pickup as directed.

## 2015-07-26 NOTE — Telephone Encounter (Signed)
PREPOPIK-DRINK WATER TO KEEP URINE LIGHT YELLOW.  PT SHOULD DROP OFF RX 3 DAYS PRIOR TO PROCEDURE.  

## 2015-08-01 NOTE — Telephone Encounter (Signed)
Pt has now been scheduled for 08/17/2015 at 1:45 PM with Dr. Darrick Penna for the colonoscopy.

## 2015-08-06 ENCOUNTER — Other Ambulatory Visit: Payer: Self-pay

## 2015-08-06 DIAGNOSIS — Z1211 Encounter for screening for malignant neoplasm of colon: Secondary | ICD-10-CM

## 2015-08-06 MED ORDER — PEG 3350-KCL-NA BICARB-NACL 420 G PO SOLR: 4000.0000 mL | ORAL | Status: DC

## 2015-08-06 NOTE — Telephone Encounter (Signed)
Rx sent to the pharmacy and instructions mailed to pt.  

## 2015-08-07 MED FILL — ACETAMINOPHEN/COD #3 TABLET: 300-30 | 20 days supply | Qty: 60 | Fill #1

## 2015-08-07 MED FILL — TRILYTE WITH FLAVOR PACKETS: 420 | 1 days supply | Qty: 4000 | Fill #0

## 2015-08-07 MED FILL — AMLODIPINE BESYLATE 10 MG T: 10 | 30 days supply | Qty: 30 | Fill #1

## 2015-08-17 ENCOUNTER — Encounter (HOSPITAL_COMMUNITY)
Admission: RE | Disposition: A | Discharge status: Home / Self Care | Payer: Self-pay | Source: Ambulatory Visit | Attending: Gastroenterology

## 2015-08-17 ENCOUNTER — Encounter (HOSPITAL_COMMUNITY): Payer: Self-pay

## 2015-08-17 ENCOUNTER — Ambulatory Visit (HOSPITAL_COMMUNITY)
Admission: RE | Admit: 2015-08-17 | Discharge: 2015-08-17 | Disposition: A | Discharge status: Home / Self Care | Payer: Self-pay | Source: Ambulatory Visit | Attending: Gastroenterology | Admitting: Gastroenterology

## 2015-08-17 DIAGNOSIS — K648 Other hemorrhoids: Secondary | ICD-10-CM | POA: Insufficient documentation

## 2015-08-17 DIAGNOSIS — K5730 Diverticulosis of large intestine without perforation or abscess without bleeding: Secondary | ICD-10-CM | POA: Insufficient documentation

## 2015-08-17 DIAGNOSIS — Z79899 Other long term (current) drug therapy: Secondary | ICD-10-CM | POA: Insufficient documentation

## 2015-08-17 DIAGNOSIS — Z1211 Encounter for screening for malignant neoplasm of colon: Secondary | ICD-10-CM

## 2015-08-17 DIAGNOSIS — I10 Essential (primary) hypertension: Secondary | ICD-10-CM | POA: Insufficient documentation

## 2015-08-17 HISTORY — PX: COLONOSCOPY: SHX5424

## 2015-08-17 SURGERY — COLONOSCOPY
Anesthesia: Moderate Sedation

## 2015-08-17 MED ORDER — STERILE WATER FOR IRRIGATION IR SOLN
Status: DC | PRN
  Administered 2015-08-17: 14:00:00

## 2015-08-17 MED ORDER — SODIUM CHLORIDE 0.9 % IV SOLN
INTRAVENOUS | Status: DC
  Administered 2015-08-17: 13:00:00 via INTRAVENOUS

## 2015-08-17 MED ORDER — MEPERIDINE HCL 100 MG/ML IJ SOLN
INTRAMUSCULAR | Status: DC | PRN
  Administered 2015-08-17: 25 mg via INTRAVENOUS

## 2015-08-17 MED ORDER — MIDAZOLAM HCL 5 MG/5ML IJ SOLN
INTRAMUSCULAR | Status: DC | PRN
  Administered 2015-08-17: 1 mg via INTRAVENOUS
  Administered 2015-08-17: 2 mg via INTRAVENOUS

## 2015-08-17 MED ORDER — MIDAZOLAM HCL 5 MG/5ML IJ SOLN
INTRAMUSCULAR | Status: AC
  Filled 2015-08-17: qty 10

## 2015-08-17 MED ORDER — MEPERIDINE HCL 100 MG/ML IJ SOLN
INTRAMUSCULAR | Status: AC
  Filled 2015-08-17: qty 2

## 2015-08-17 NOTE — H&P (Signed)
  Primary Care Physician:  Lora PaulaFUNCHES, JOSALYN C, MD Primary Gastroenterologist:  Dr. Darrick PennaFields  Pre-Procedure History & Physical: HPI:  Thomas QualeJose Ortiz is a 60 y.o. male here for COLON CANCER SCREENING.  Past Medical History  Diagnosis Date  . Hypertension 2007    Past Surgical History  Procedure Laterality Date  . Bilateral foot sx  2011  . Cholecystectomy N/A 01/30/2015    Procedure: LAPAROSCOPIC CHOLECYSTECTOMY WITH INTRAOPERATIVE CHOLANGIOGRAM;  Surgeon: Manus RuddMatthew Tsuei, MD;  Location: [facility] OR;  Service: General;  Laterality: N/A;    Prior to Admission medications   Medication Sig Start Date End Date Taking? Authorizing Provider  acetaminophen-codeine (TYLENOL #3) 300-30 MG tablet Take 1 tablet by mouth every 8 (eight) hours as needed for moderate pain. 07/05/15  Yes Josalyn Funches, MD  amLODipine (NORVASC) 10 MG tablet Take 1 tablet (10 mg total) by mouth daily. 07/05/15  Yes Josalyn Funches, MD  indomethacin (INDOCIN) 25 MG capsule Take 1 capsule (25 mg total) by mouth 2 (two) times daily as needed for moderate pain. 07/05/15  Yes Josalyn Funches, MD  polyethylene glycol-electrolytes (TRILYTE) 420 g solution Take 4,000 mLs by mouth as directed. 08/06/15  Yes West BaliSandi L Fields, MD    Allergies as of 08/06/2015  . (No Known Allergies)    History reviewed. No pertinent family history.  Social History   Social History  . Marital Status: Married    Spouse Name: N/A  . Number of Children: N/A  . Years of Education: N/A   Occupational History  . Not on file.   Social History Main Topics  . Smoking status: Never Smoker   . Smokeless tobacco: Not on file  . Alcohol Use: 1.2 oz/week    2 Cans of beer per week     Comment: occasionally  . Drug Use: No  . Sexual Activity: Not on file   Other Topics Concern  . Not on file   Social History Narrative    Review of Systems: See HPI, otherwise negative ROS   Physical Exam: BP 123/81 mmHg  Pulse 67  Temp(Src) 97.8 F (36.6 C) (Oral)   Resp 21  Ht 5\' 5"  (1.651 m)  Wt 155 lb (70.308 kg)  BMI 25.79 kg/m2  SpO2 100% General:   Alert,  pleasant and cooperative in NAD Head:  Normocephalic and atraumatic. Neck:  Supple; Lungs:  Clear throughout to auscultation.    Heart:  Regular rate and rhythm. Abdomen:  Soft, nontender and nondistended. Normal bowel sounds, without guarding, and without rebound.   Neurologic:  Alert and  oriented x4;  grossly normal neurologically.  Impression/Plan:     SCREENING  Plan:  1. TCS TODAY

## 2015-08-17 NOTE — Op Note (Addendum)
[facility] [address] [place], [id number]   COLONOSCOPY PROCEDURE REPORT  PATIENT: Thomas Ortiz, Thomas Ortiz  MR#: [id number] BIRTHDATE: Nov 09, 1955 , 59  yrs. old GENDER: male ENDOSCOPIST: West BaliSandi L Jasha Hodzic, MD REFERRED HY:QMVHQIOBY:Josalyn Armen PickupFunches, MD PROCEDURE DATE:  08/17/2015 PROCEDURE:   Colonoscopy, screening INDICATIONS:average risk patient for colon cancer. MEDICATIONS: Demerol 25 mg IV and Versed 3 mg IV MD STARTEED SEDATION: 1354 PROCEDURE COMPLETE: 1419  DESCRIPTION OF PROCEDURE:    Physical exam was performed.  Informed consent was obtained from the patient after explaining the benefits, risks, and alternatives to procedure.  The patient was connected to monitor and placed in left lateral position. Continuous oxygen was provided by nasal cannula and IV medicine administered through an indwelling cannula.  After administration of sedation and rectal exam, the patients rectum was intubated and the EC-[id number] ([id number]  colonoscope was advanced under direct visualization to the cecum.  The scope was removed slowly by carefully examining the color, texture, anatomy, and integrity mucosa on the way out.  The patient was recovered in endoscopy and discharged home in satisfactory condition. Estimated blood loss is zero unless otherwise noted in this procedure report.    COLON FINDINGS: There was moderate diverticulosis noted throughout the entire examined colon with associated muscular hypertrophy and Moderate sized internal hemorrhoids were found.  PREP QUALITY: excellent.  CECAL W/D TIME: 23       minutes  COMPLICATIONS: None  ENDOSCOPIC IMPRESSION: 1.   There was moderate diverticulosis noted throughout the entire examined colon 2.   Moderate sized internal hemorrhoids  RECOMMENDATIONS: DRINK WATER EAT FIBER NEXT TCS IN 10 YEARS      _______________________________ eSignedWest Bali:  Camiya Vinal L Bowie Delia, MD 08/17/2015 3:29 PM    CPT CODES: ICD CODES:  The ICD and CPT  codes recommended by this software are interpretations from the data that the clinical staff has captured with the software.  The verification of the translation of this report to the ICD and CPT codes and modifiers is the sole responsibility of the health care institution and practicing physician where this report was generated.  [employer] will not be held responsible for the validity of the ICD and CPT codes included on this report.  AMA assumes no liability for data contained or not contained herein. CPT is a Publishing rights managerregistered trademark of the [employer].

## 2015-08-17 NOTE — Discharge Instructions (Signed)
You have MODERATE internal and EXTERNAL hemorrhoids and diverticulosis IN YOUR RIGHT AND LEFT COLON.   DRINK WATER TO KEEP YOUR URINE LIGHT YELLOW.  Follow a HIGH FIBER DIET. AVOID ITEMS THAT CAUSE BLOATING. See info below.  Next colonoscopy in 10 years.    Colonoscopa: cuidados posteriores  (Colonoscopy, Care After)  Siga estas instrucciones durante las prximas semanas. Estas indicaciones le proporcionan informacin general acerca de cmo deber cuidarse despus del procedimiento. El mdico tambin podr darle instrucciones ms especficas. El tratamiento ha sido planificado segn las prcticas mdicas actuales, pero en algunos casos pueden ocurrir problemas. Comunquese con el mdico si tiene algn problema o tiene dudas despus del procedimiento.  QU ESPERAR DESPUS DEL PROCEDIMIENTO  Despus del procedimiento, es comn tener las siguientes sensaciones:  Una pequea cantidad de sangre en la materia fecal.  Cantidades moderadas de gases e hinchazn o calambres abdominales leves. INSTRUCCIONES PARA EL CUIDADO EN EL HOGAR  No conduzca vehculos, opere maquinarias ni firme documentos importantes durante 24 horas.  Puede ducharse y retomar sus actividades fsicas habituales, pero muvase a un ritmo ms lento durante las primeras 24 horas.  Tmese descansos frecuentes durante las primeras 24 horas.  Camine o colquese compresas calientes en el abdomen para ayudar a reducir los calambres e hinchazn abdominales.  Beba suficiente lquido para Photographer orina clara o de color amarillo plido.  Puede retomar su dieta normal segn las instrucciones de su mdico. Evite los alimentos pesados o fritos que son difciles de Location manager.  Evite consumir alcohol durante 24 horas o segn las instrucciones de su mdico.  [place] solo medicamentos de venta libre o recetados, segn las indicaciones del mdico.  Si se obtuvo una muestra de tejido (biopsia) durante el procedimiento:  No tome aspirina ni  anticoagulantes durante 7 das, o segn las instrucciones de su mdico.  No consuma alcohol durante 7 das o segn las instrucciones de su mdico.  Consuma alimentos livianos durante las primeras 24 horas. SOLICITE ATENCIN MDICA SI:  Tiene manchas persistentes de sangre en la materia fecal entre 2 y 3 das posteriores al procedimiento.  SOLICITE ATENCIN MDICA DE INMEDIATO SI:  Tiene ms que una pequea mancha de sangre en la materia fecal.  Elimina grandes cogulos de sangre en la materia fecal.  Tiene el abdomen hinchado (distendido).  Tiene nuseas o vmitos.  Tiene fiebre.  Siente dolor intenso en el abdomen que no se alivia con los [employer].   Dieta rica en fibra  (High-Fiber Diet)  La fibra, tambin llamada fibra dietaria, es un tipo de carbohidrato que se encuentra en las frutas, las verduras, los cereales integrales y los frijoles. Una dieta rica en fibra puede tener muchos beneficios para la salud. El mdico puede recomendar una dieta rica en fibra para ayudar a:  Chief Strategy Officer. La fibra puede hacer que defeque con ms frecuencia.  Disminuir el nivel de colesterol.  Aliviar las hemorroides, la diverticulosis no complicada o el sndrome del intestino irritable.  Evitar comer en exceso como parte de un plan para bajar de peso.  Evitar cardiopatas, la diabetes tipo 2 y ciertos cnceres. EN QU CONSISTE EL PLAN?  El consumo diario recomendado de fibra incluye lo siguiente:  38 gramos para hombres menores de 50 aos.  30 gramos para hombres mayores de [place].  25 gramos para mujeres menores de 50 aos.  21 gramos para mujeres mayores de [place]. Puede lograr el consumo diario recomendado de [place] si come una variedad de frutas, verduras,  cereales y frijoles. El mdico tambin puede recomendar un suplemento de fibra si no es posible obtener suficiente fibra a travs de la dieta.  QU DEBO SABER ACERCA DE LA DIETA RICA EN FIBRA?  La eficacia de los suplementos de  [place] no ha sido estudiada [place], de modo que es mejor obtener fibra a travs de los alimentos.  Verifique siempre el contenido de fibra en la etiqueta de informacin nutricional de los alimentos preenvasados. Busque alimentos que contengan al menos 5 gramos de fibra por porcin.  Consulte al nutricionista si tiene preguntas sobre algunos alimentos especficos relacionados con su enfermedad, especialmente si estos alimentos no se mencionan a continuacin.  Aumente el consumo diario de fibra en forma gradual. Aumentar demasiado rpido el consumo de fibra dietaria puede provocar meteorismo, clicos o gases.  Beber abundante agua. El [place] a Geophysicist/field seismologist. QU ALIMENTOS PUEDO COMER?  Cereales  Panes integrales. Multicereales. Avena. Arroz integral. Gypsy Decant. Trigo burgol. Mijo. Muffins de salvado. Palomitas de maz. Galletas de centeno.  Verduras  Batatas. [place]spinaca. Col rizada. Alcachofas. Repollo. Brcoli. Guisantes. Zanahorias. [place].  Frutas  Frutos rojos. Peras. Manzanas. Naranjas [place]. Ciruelas y pasas. Higos secos.  Carnes y otras fuentes de protenas  Frijoles blancos, colorados, pintos y porotos de soja. Guisantes secos. Lentejas. Frutos secos y semillas.  Lcteos  Yogur fortificado con Research scientist (life sciences).  Bebidas  Leche de soja fortificada con Bjorn Loser. Jugo de naranja fortificado con Bjorn Loser.  Otros  Barras de [place].  Los artculos mencionados arriba pueden no ser [employer] de las bebidas o los alimentos recomendados. Comunquese con el nutricionista para conocer ms opciones.  QU ALIMENTOS NO SE RECOMIENDAN?  Cereales  Pan blanco. Pastas hechas con [place]. Arroz blanco.  Verduras  Papas fritas. Verduras enlatadas. Verduras bien cocidas.  Frutas  Jugo de frutas. Frutas cocidas coladas.  Carnes y [address] fuentes de protenas  Cortes de carne con Holiday representative. Aves o pescados fritos.  Lcteos  Leche. Yogur. Queso crema. [employer].  Bebidas  Gaseosas.  Otros    Tortas y pasteles. [place]equilla y aceites.  Los artculos mencionados arriba pueden no ser [employer] de las bebidas y los alimentos que se Theatre stage manager. Comunquese con el nutricionista para obtener ms informacin.  ALGUNOS CONSEJOS PARA INCLUIR ALIMENTOS RICOS EN FIBRA EN LA DIETA  Consuma una gran variedad de alimentos ricos en fibra.  Asegrese de que la mitad de todos los cereales consumidos cada da sean cereales integrales.  Reemplace los panes y cereales hechos de harina refinada o harina blanca por panes y cereales integrales.  Reemplace el arroz blanco por arroz integral, trigo burgol o mijo.  Comience Medical laboratory scientific officer con un desayuno rico en [place], como un cereal que contenga al menos 5 gramos de fibra por porcin.  Use guisantes en lugar de carne en las sopas, ensaladas o pastas.  Coma bocadillos ricos en fibra, como frutos rojos, verduras crudas, frutos secos o palomitas de maz.   Diverticulosis  (Diverticulosis)    La diverticulosis es una enfermedad que aparece cuando se forman pequeos bolsillos (divertculos) en las paredes del colon. El colon, o intestino grueso, es el lugar donde se absorbe agua y se forman las heces. Los bolsillos se forman cuando la capa interna del colon ejerce presin sobre los puntos dbiles de las capas externas.   CAUSAS  Nadie sabe con exactitud qu causa la diverticulosis.  FACTORES DE RIESGO  Ser mayor de 50 aos. El riesgo de desarrollar esta enfermedad aumenta con  la edad. La diverticulosis es poco frecuente en las personas menores de 40 aos. A los 80 aos, casi todas las personas tienen la enfermedad.  Comer una dieta con bajo contenido de [place].  Estar estreido con frecuencia.  Tener sobrepeso.  No hacer suficiente ejercicio fsico.  Fumar.  Tomar analgsicos de [address], como aspirina e ibuprofeno. SNTOMAS  La mayora de las personas que tienen diverticulosis no presentan sntomas.  DIAGNSTICO  Dado que la diverticulosis no  suele causar sntomas, los mdicos a menudo descubren la enfermedad durante un examen de otros problemas de colon. En muchos casos, el mdico diagnosticar la diverticulosis mientras utiliza un endoscopio flexible para examinar el colon (colonoscopa).  TRATAMIENTO  Si nunca tuvo una infeccin relacionada con la diverticulosis, es posible que no necesite tratamiento. Si ha tenido una infeccin antes, el tratamiento puede incluir:  Comer ms frutas, verduras y cereales.  Tomar un suplemento de [place].  Tomar un suplemento de bacterias vivas (probitico).  Tomar medicamentos para relajar el colon. INSTRUCCIONES PARA EL CUIDADO EN EL HOGAR  Beba por lo menos entre 6 y 8 vasos de agua por da para Banker.  Trate de no hacer fuerza al mover el intestino.  Cumpla con todas las visitas de control. Si ha tenido una infeccin antes:  Aumente la cantidad de fibra en la dieta, segn las indicaciones del mdico o del nutricionista.  Tome un suplemento dietario con fibras si el mdico lo autoriza.  Tome los medicamentos solamente como se lo haya indicado el mdico. SOLICITE ATENCIN MDICA SI:  Siente dolor abdominal.  Tiene meteorismo.  Tiene clicos.  No ha defecado en 3 das. SOLICITE ATENCIN MDICA DE INMEDIATO SI:  El dolor empeora.  El meteorismo [employer].  Tiene fiebre o escalofros, y los sntomas empeoran repentinamente.  Comienza a vomitar.  La materia fecal es sanguinolenta o negra. ASEGRESE DE QUE:  Comprende estas instrucciones.  Controlar su afeccin.  Recibir ayuda de inmediato si no mejora o si empeora.   Hemorroides  (Hemorrhoids)   Las hemorroides son venas inflamadas (hinchadas) alrededor del recto o del ano. Las hemorroides causan dolor, picazn, sangrado o irritacin.  CUIDADOS EN EL HOGAR  Consuma alimentos con fibra, como cereales integrales, legumbres, frutos secos, frutas y verduras. Pregntele a su mdico acerca de tomar productos con fibra  aadida en ellos (suplementos de [place]).  Beba gran cantidad de lquido para mantener el pis (orina) de tono claro o amarillo plido.  Haga ejercicio a menudo.  Vaya al bao cuando sienta la necesidad de ir de cuerpo. No espere.  Evite hacer fuerza al ir de cuerpo Contractor intestino).Shan Levans la zona anal limpia y seca. Use papel higinico mojado o toallitas de papel humedecidas.  Puede utilizar o Contractor segn las indicaciones las cremas recetadas y los medicamentos que se aplican en el ano (supositorio anal )  Slo tome los medicamentos que le indique el mdico.  Tome un bao de agua tibia (bao de asiento) durante 15 a 20 minutos para Engineer, materials. Repita 3  4 veces por da.  Coloque una bolsa de hielo sobre la zona si le duele o est inflamada. Use compresas de hielo [employer] de agua tibia.  Ponga el hielo en una bolsa plstica.  Colquese una toalla entre la piel y la bolsa de hielo.  Deje el hielo en el lugar durante 15 a 20 minutos, 3 a 4 veces por da. No utilice una almohada en forma de  aro ni se siente en el inodoro durante perodos prolongados. SOLICITE AYUDA DE INMEDIATO SI:  Aumenta el dolor y no puede controlarlo con la medicacin o con Pharmacist, communityun tratamiento.  Tiene una hemorragia que no se detiene.  Tiene dificultado o no puede ir de cuerpo Contractor(mover el intestino).  Siente dolor o tiene inflamacin fuera de la zona de las hemorroides. ASEGRESE DE QUE:  Comprende estas instrucciones.  Controlar su enfermedad.  Solicitar ayuda de inmediato si no mejora o si empeora. Document Released: 09/27/2012  [facility] Patient Information 2015 [place], [place]. This information is not intended to replace advice given to you by your health care provider. Make sure you discuss any questions you have with your health care provider.

## 2015-08-21 ENCOUNTER — Encounter (HOSPITAL_COMMUNITY): Payer: Self-pay | Admitting: Gastroenterology

## 2015-09-12 MED FILL — ACETAMINOPHEN/COD #3 TABLET: 300-30 | 20 days supply | Qty: 60 | Fill #2

## 2015-09-20 ENCOUNTER — Encounter: Payer: Self-pay | Admitting: Family Medicine

## 2015-09-20 ENCOUNTER — Ambulatory Visit: Payer: Self-pay | Attending: Family Medicine | Admitting: Family Medicine

## 2015-09-20 VITALS — BP 145/87 | HR 65 | Temp 98.4°F | Resp 16 | Ht 64.0 in | Wt 153.0 lb

## 2015-09-20 DIAGNOSIS — I10 Essential (primary) hypertension: Secondary | ICD-10-CM | POA: Insufficient documentation

## 2015-09-20 DIAGNOSIS — M130 Polyarthritis, unspecified: Secondary | ICD-10-CM | POA: Insufficient documentation

## 2015-09-20 DIAGNOSIS — Z79899 Other long term (current) drug therapy: Secondary | ICD-10-CM | POA: Insufficient documentation

## 2015-09-20 LAB — BASIC METABOLIC PANEL
BUN: 16 mg/dL (ref 7–25)
CALCIUM: 8.9 mg/dL (ref 8.6–10.3)
CHLORIDE: 103 mmol/L (ref 98–110)
CO2: 26 mmol/L (ref 20–31)
CREATININE: 0.95 mg/dL (ref 0.70–1.25)
GLUCOSE: 91 mg/dL (ref 65–99)
Potassium: 4.5 mmol/L (ref 3.5–5.3)
Sodium: 138 mmol/L (ref 135–146)

## 2015-09-20 MED ORDER — INDOMETHACIN 25 MG PO CAPS: 25.0000 mg | ORAL_CAPSULE | Freq: Two times a day (BID) | ORAL | Status: DC | PRN

## 2015-09-20 MED ORDER — AMLODIPINE BESYLATE 10 MG PO TABS: 10.0000 mg | ORAL_TABLET | Freq: Every day | ORAL | Status: DC

## 2015-09-20 MED FILL — INDOMETHACIN 25 MG CAPSULE: 25 | 15 days supply | Qty: 30 | Fill #0

## 2015-09-20 MED FILL — AMLODIPINE BESYLATE 10 MG T: 10 | 30 days supply | Qty: 30 | Fill #2

## 2015-09-20 NOTE — Progress Notes (Signed)
Subjective:  Patient ID: Thomas Ortiz, male    DOB: 1955/07/27  Age: 60 y.o. MRN: [id number]  CC: Arthritis   HPI Thomas Ortiz for   1. Joint pain: improved overall. 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 Indocin and tylenol #3 helped. Of note, joint pain and swelling started acutely after gallbladder surgery in 01/2015. He has ran out of indocin and is requesting Rx refill. He has been evaluated by rheumatology. He is confirmed to have polyarthritis.    2. CHRONIC HYPERTENSION  Disease Monitoring  Blood pressure range: not checking   Chest pain: no   Dyspnea: no   Claudication: no   Medication compliance: no, ran out  Medication Side Effects  Lightheadedness: no   Urinary frequency: no   Edema: no    Outpatient Prescriptions Prior to Visit  Medication Sig Dispense Refill  . acetaminophen-codeine (TYLENOL #3) 300-30 MG tablet Take 1 tablet by mouth every 8 (eight) hours as needed for moderate pain. 60 tablet 2  . amLODipine (NORVASC) 10 MG tablet Take 1 tablet (10 mg total) by mouth daily. 30 tablet 5  . indomethacin (INDOCIN) 25 MG capsule Take 1 capsule (25 mg total) by mouth 2 (two) times daily as needed for moderate pain. (Patient not taking: Reported on 09/20/2015) 30 capsule 0   No facility-administered medications prior to visit.    ROS Review of Systems  Constitutional: Negative for fever, chills, fatigue and unexpected weight change.  Eyes: Negative for visual disturbance.  Respiratory: Negative for cough and shortness of breath.   Cardiovascular: Negative for chest pain, palpitations and leg swelling.  Gastrointestinal: Negative for nausea, vomiting, abdominal pain, diarrhea, constipation and blood in stool.  Endocrine: Negative for polydipsia, polyphagia and polyuria.  Musculoskeletal: Positive for joint swelling and arthralgias. Negative for myalgias, back pain, gait problem and neck pain.  Skin: Negative for  rash.  Allergic/Immunologic: Negative for immunocompromised state.  Hematological: Negative for adenopathy. Does not bruise/bleed easily.  Psychiatric/Behavioral: Negative for suicidal ideas, sleep disturbance and dysphoric mood. The patient is not nervous/anxious.     Objective:  BP 145/87 mmHg  Pulse 65  Temp(Src) 98.4 F (36.9 C) (Oral)  Resp 16  Ht 5\' 4"  (1.626 m)  Wt 153 lb (69.4 kg)  BMI 26.25 kg/m2  SpO2 98%  BP/Weight 09/20/2015 08/17/2015 07/26/2015  Systolic BP 145 90 118  Diastolic BP 87 53 80  Wt. (Lbs) 153 155 -  BMI 26.25 25.79 -    Physical Exam  Constitutional: He appears well-developed and well-nourished. No distress.  HENT:  Head: Normocephalic and atraumatic.  Neck: Normal range of motion. Neck supple.  Cardiovascular: Normal rate, regular rhythm, normal heart sounds and intact distal pulses.   Pulmonary/Chest: Effort normal and breath sounds normal.  Musculoskeletal: He exhibits no edema.   Decreased ROM and healed surgical scars at anterior ankles   Neurological: He is alert.  Skin: Skin is warm and dry. No rash noted. No erythema.  Psychiatric: He has a normal mood and affect.     Assessment & Plan:   There are no diagnoses linked to this encounter. Thomas Ortiz was seen today for arthritis.  Diagnoses and all orders for this visit:  Polyarthritis -     indomethacin (INDOCIN) 25 MG capsule; Take 1 capsule (25 mg total) by mouth 2 (two) times daily as needed for moderate pain. -     Basic Metabolic Panel  Essential hypertension -  amLODipine (NORVASC) 10 MG tablet; Take 1 tablet (10 mg total) by mouth daily.   Meds ordered this encounter  Medications  . indomethacin (INDOCIN) 25 MG capsule    Sig: Take 1 capsule (25 mg total) by mouth 2 (two) times daily as needed for moderate pain.    Dispense:  30 capsule    Refill:  5  . amLODipine (NORVASC) 10 MG tablet    Sig: Take 1 tablet (10 mg total) by mouth daily.    Dispense:  30 tablet    Refill:   5    Follow-up: No Follow-up on file.   Dessa Phi MD

## 2015-09-20 NOTE — Assessment & Plan Note (Addendum)
A: improved P: Refilled indocin Tylenol #3 prn Check BMP today with plan for repeat q 6 months if Cr stable  Advised wearing ankle braces or ace bandage at work

## 2015-09-20 NOTE — Patient Instructions (Addendum)
Thomas Ortiz was seen today for arthritis.  Diagnoses and all orders for this visit:  Polyarthritis -     indomethacin (INDOCIN) 25 MG capsule; Take 1 capsule (25 mg total) by mouth 2 (two) times daily as needed for moderate pain.  Essential hypertension -     amLODipine (NORVASC) 10 MG tablet; Take 1 tablet (10 mg total) by mouth daily.    Goal BP is < 140/90 Take norvasc every day   F/u in 4-6 weeks for pharmacy BP check  Plan to see me in 3 months sooner if needed. Call when you are in need of tylenol #3 refill  Dr. Armen PickupFunches

## 2015-09-20 NOTE — Progress Notes (Signed)
F/U HTN  And arthritis Pain scale #10  No tobacco user  No suicidal thoughts in the past two weeks

## 2015-09-20 NOTE — Assessment & Plan Note (Signed)
A: elevated due to med non compliance P: refilled Norvasc

## 2015-09-24 ENCOUNTER — Telehealth: Payer: Self-pay | Admitting: *Deleted

## 2015-09-24 NOTE — Telephone Encounter (Signed)
Date of birth verified by pt Normal lab results given  Pt verbalized understanding  

## 2015-09-24 NOTE — Telephone Encounter (Signed)
-----   Message from Dessa PhiJosalyn Funches, MD sent at 09/21/2015  8:30 AM EDT ----- Normal BMP Normal Cr

## 2015-10-12 MED FILL — INDOMETHACIN 25 MG CAPSULE: 25 | 15 days supply | Qty: 30 | Fill #1

## 2015-10-18 ENCOUNTER — Encounter: Payer: Self-pay | Admitting: Pharmacist

## 2015-10-18 ENCOUNTER — Ambulatory Visit: Payer: Self-pay | Attending: Family Medicine | Admitting: Pharmacist

## 2015-10-18 ENCOUNTER — Encounter (INDEPENDENT_AMBULATORY_CARE_PROVIDER_SITE_OTHER): Payer: Self-pay

## 2015-10-18 VITALS — BP 123/79 | HR 73

## 2015-10-18 DIAGNOSIS — Z79899 Other long term (current) drug therapy: Secondary | ICD-10-CM | POA: Insufficient documentation

## 2015-10-18 DIAGNOSIS — I10 Essential (primary) hypertension: Secondary | ICD-10-CM | POA: Insufficient documentation

## 2015-10-18 MED FILL — AMLODIPINE BESYLATE 10 MG T: 10 | 30 days supply | Qty: 30 | Fill #3

## 2015-10-18 NOTE — Progress Notes (Signed)
S:    Patient arrives in good spirits.    Presents to the clinic for hypertension evaluation. Patient was referred on 09/20/15 by Dr. Armen PickupFunches.  Patient was last seen by Primary Care Provider on 09/20/15.   Patient reports adherence with medications.  Current BP Medications include:  Amlodipine 10 mg daily   O:   Last 3 Office BP readings: BP Readings from Last 3 Encounters:  10/18/15 123/79  09/20/15 145/87  08/17/15 90/53    BMET    Component Value Date/Time   NA 138 09/20/2015 1717   K 4.5 09/20/2015 1717   CL 103 09/20/2015 1717   CO2 26 09/20/2015 1717   GLUCOSE 91 09/20/2015 1717   BUN 16 09/20/2015 1717   CREATININE 0.95 09/20/2015 1717   CREATININE 0.96 01/29/2015 1752   CALCIUM 8.9 09/20/2015 1717   GFRNONAA 81 06/14/2015 1614   GFRNONAA >60 01/29/2015 1752   GFRAA >89 06/14/2015 1614   GFRAA >60 01/29/2015 1752    A/P: Hypertension longstanding currently controlled on current medications.  Continued amlodipine 10 mg daily as directed by Dr. Armen PickupFunches. Encouraged patient to take amlodipine daily and to not miss any doses.  Results reviewed and written information provided.   Total time in face-to-face counseling 5 minutes.   F/U Clinic Visit with Dr. Armen PickupFunches as directed

## 2015-10-18 NOTE — Patient Instructions (Signed)
Thanks for coming to see me - your blood pressure looks great!  Keep taking all of your medications   Follow up with Dr. Armen PickupFunches

## 2015-11-02 MED FILL — INDOMETHACIN 25 MG CAPSULE: 25 | 15 days supply | Qty: 30 | Fill #2

## 2015-11-09 ENCOUNTER — Ambulatory Visit: Payer: Self-pay | Attending: Family Medicine

## 2015-11-22 MED FILL — INDOMETHACIN 25 MG CAPSULE: 25 | 15 days supply | Qty: 30 | Fill #3

## 2015-11-22 MED FILL — ?AMLODIPINE BESYLATE 10 MG: 10 | 30 days supply | Qty: 30 | Fill #4

## 2015-12-11 ENCOUNTER — Telehealth: Payer: Self-pay | Admitting: Family Medicine

## 2015-12-11 DIAGNOSIS — M130 Polyarthritis, unspecified: Secondary | ICD-10-CM

## 2015-12-11 MED ORDER — ACETAMINOPHEN-CODEINE #3 300-30 MG PO TABS: 1.0000 | ORAL_TABLET | Freq: Three times a day (TID) | ORAL | Status: DC | PRN

## 2015-12-11 NOTE — Telephone Encounter (Signed)
Patient called requesting medication refill on acetaminophen-codeine (TYLENOL #3) 300-30 MG tablet °Please f/up  °

## 2015-12-11 NOTE — Telephone Encounter (Signed)
Tylenol #3 ready for pick up Patient to sign controlled substance contract

## 2015-12-11 NOTE — Telephone Encounter (Signed)
Called pt. Reached voicemail. Left message to return call at [phone]. 

## 2015-12-13 MED FILL — INDOMETHACIN 25 MG CAPSULE: 25 | 15 days supply | Qty: 30 | Fill #4

## 2015-12-13 MED FILL — ACETAMINOPHEN/COD #3 TABLET: 300-30 | 20 days supply | Qty: 60 | Fill #0

## 2015-12-13 NOTE — Telephone Encounter (Signed)
Called pt. Pt verified name and date of birth. Pt notified that his tylenol #3 rx is ready for pick up and is placed at the front desk. Pt also notified to sign a controlled substance contract when he comes to pick up the rx. Pt voiced understanding.

## 2015-12-21 ENCOUNTER — Encounter: Payer: Self-pay | Admitting: Physician Assistant

## 2015-12-21 ENCOUNTER — Ambulatory Visit: Payer: Self-pay | Attending: Family Medicine | Admitting: Physician Assistant

## 2015-12-21 VITALS — BP 133/86 | HR 60 | Temp 98.5°F | Resp 16 | Wt 164.0 lb

## 2015-12-21 DIAGNOSIS — H1013 Acute atopic conjunctivitis, bilateral: Secondary | ICD-10-CM

## 2015-12-21 DIAGNOSIS — H269 Unspecified cataract: Secondary | ICD-10-CM

## 2015-12-21 MED ORDER — OLOPATADINE HCL 0.1 % OP SOLN
1.0000 [drp] | Freq: Two times a day (BID) | OPHTHALMIC | Status: DC
Start: 2015-12-21 — End: 2016-09-15

## 2015-12-21 MED FILL — ?OLOPATADINE HCL 0.1% EYE D: 0.1% | 15 days supply | Qty: 5 | Fill #0

## 2015-12-21 NOTE — Progress Notes (Signed)
Pt is in the office today for a sinus infection Sinusitis started 3 weeks ago Symptoms includes watery eyes  Pt declines trying anything otc

## 2015-12-21 NOTE — Progress Notes (Signed)
Patient ID: Thomas Ortiz, male   DOB: 12/02/1955, 60 y.o.   MRN: [id number]   Thomas Ortiz, is a 60 y.o. male  [id number]  [id number]  DOB - 12/02/1955  Chief Complaint  Patient presents with  . Sinusitis        Subjective:  Chief Complaint and HPI: Thomas Ortiz is a 60 y.o. male here today to for itchy, watery eyes.  He has occasional blurry vision.  He hasn't seen an eye doctor in years.  He does not take any allergy medications.  His eyes have been itching and watering for about 3 weeks. No FB.    Compliant with BP meds. No CP, HA, or claudication type symptoms.   ROS:   Constitutional:  No f/c, No night sweats, No unexplained weight loss. EENT:   No hearing changes. No mouth, throat, or ear problems. +occasional runny nose.  No sinus pain or pressure Respiratory: No cough, No SOB Cardiac: No CP, no palpitations GI:  No abd pain, No N/V/D. GU: No Urinary s/sx Musculoskeletal: No joint pain Neuro: No headache, no dizziness, no motor weakness.  Skin: No rash Endocrine:  No polydipsia. No polyuria. No s/sx hyperglycemia Psych: Denies SI/HI  No problems updated.  ALLERGIES: No Known Allergies  PAST MEDICAL HISTORY: Past Medical History  Diagnosis Date  . Hypertension 2007    MEDICATIONS AT HOME: Prior to Admission medications   Medication Sig Start Date End Date Taking? Authorizing Provider  acetaminophen-codeine (TYLENOL #3) 300-30 MG tablet Take 1 tablet by mouth every 8 (eight) hours as needed for moderate pain. 12/11/15  Yes Josalyn Funches, MD  amLODipine (NORVASC) 10 MG tablet Take 1 tablet (10 mg total) by mouth daily. 09/20/15  Yes Josalyn Funches, MD  indomethacin (INDOCIN) 25 MG capsule Take 1 capsule (25 mg total) by mouth 2 (two) times daily as needed for moderate pain. 09/20/15  Yes Josalyn Funches, MD  olopatadine (PATANOL) 0.1 % ophthalmic solution Place 1 drop into both eyes 2 (two) times daily. 12/21/15   Anders SimmondsAngela M Lamica Mccart, PA-C     Objective:    EXAM:   Filed Vitals:   12/21/15 0910  BP: 133/86  Pulse: 60  Temp: 98.5 F (36.9 C)  TempSrc: Oral  Resp: 16  Weight: 164 lb (74.39 kg)  SpO2: 98%    General appearance : A&OX3. NAD. Non-toxic-appearing HEENT: Atraumatic and Normocephalic.  PERRLA. EOM intact.  There is pale erythema of B conjunctiva (palpebral and bulbar) with mild chemosis.  There are cataracts B.  Fundi benign grossly. TM clear B. Mouth-MMM, post pharynx WNL w/o erythema, No PND. Neck: supple, no JVD. No cervical lymphadenopathy. No thyromegaly Chest/Lungs:  Breathing-non-labored, Good air entry bilaterally, breath sounds normal without rales, rhonchi, or wheezing  CVS: S1 S2 regular, no murmurs, gallops, rubs  Extremities: Bilateral Lower Ext shows no edema, both legs are warm to touch with = pulse throughout Neurology:  CN II-XII grossly intact, Non focal.   Psych:  TP linear. J/I WNL. Normal speech. Appropriate eye contact and affect.    Data Review Lab Results  Component Value Date   HGBA1C 5.70 07/05/2015     Assessment & Plan   1. Allergic conjunctivitis, bilateral - olopatadine (PATANOL) 0.1 % ophthalmic solution; Place 1 drop into both eyes 2 (two) times daily.  Dispense: 5 mL; Refill: 12  2. Cataracts, bilateral Poor vision - Ambulatory referral to Ophthalmology  Patient have been counseled extensively about nutrition and exercise  Return in about 3 months (around  03/22/2016) for bp and bloodwork check up.  The patient was given clear instructions to go to ER or return to medical center if symptoms don't improve, worsen or new problems develop. The patient verbalized understanding. The patient was told to call to get lab results if they haven't heard anything in the next week.     Georgian CoAngela Shevonne Wolf, PA-C [facility] [place], [place] [phone]   12/21/2015, 9:35 AM

## 2016-01-03 MED FILL — INDOMETHACIN 25 MG CAPSULE: 25 | 15 days supply | Qty: 30 | Fill #5

## 2016-01-03 MED FILL — AMLODIPINE BESYLATE 10 MG T: 10 | 30 days supply | Qty: 30 | Fill #5

## 2016-01-11 ENCOUNTER — Encounter (HOSPITAL_COMMUNITY): Payer: Self-pay | Admitting: *Deleted

## 2016-01-11 ENCOUNTER — Emergency Department (HOSPITAL_COMMUNITY)
Admission: EM | Admit: 2016-01-11 | Discharge: 2016-01-11 | Disposition: A | Discharge status: Home / Self Care | Payer: Self-pay | Attending: Emergency Medicine | Admitting: Emergency Medicine

## 2016-01-11 DIAGNOSIS — K649 Unspecified hemorrhoids: Secondary | ICD-10-CM | POA: Insufficient documentation

## 2016-01-11 DIAGNOSIS — I10 Essential (primary) hypertension: Secondary | ICD-10-CM | POA: Insufficient documentation

## 2016-01-11 DIAGNOSIS — K625 Hemorrhage of anus and rectum: Secondary | ICD-10-CM

## 2016-01-11 LAB — URINALYSIS, ROUTINE W REFLEX MICROSCOPIC
Bilirubin Urine: NEGATIVE
Glucose, UA: NEGATIVE mg/dL
Hgb urine dipstick: NEGATIVE
Ketones, ur: NEGATIVE mg/dL
LEUKOCYTES UA: NEGATIVE
Nitrite: NEGATIVE
PROTEIN: NEGATIVE mg/dL
Specific Gravity, Urine: 1.017 (ref 1.005–1.030)
pH: 7 (ref 5.0–8.0)

## 2016-01-11 LAB — CBC
HEMATOCRIT: 38 % — AB (ref 39.0–52.0)
Hemoglobin: 12.6 g/dL — ABNORMAL LOW (ref 13.0–17.0)
MCH: 30.9 pg (ref 26.0–34.0)
MCHC: 33.2 g/dL (ref 30.0–36.0)
MCV: 93.1 fL (ref 78.0–100.0)
Platelets: 290 10*3/uL (ref 150–400)
RBC: 4.08 MIL/uL — ABNORMAL LOW (ref 4.22–5.81)
RDW: 13 % (ref 11.5–15.5)
WBC: 6.4 10*3/uL (ref 4.0–10.5)

## 2016-01-11 LAB — COMPREHENSIVE METABOLIC PANEL
ALBUMIN: 4 g/dL (ref 3.5–5.0)
ALT: 34 U/L (ref 17–63)
ANION GAP: 7 (ref 5–15)
AST: 22 U/L (ref 15–41)
Alkaline Phosphatase: 114 U/L (ref 38–126)
BILIRUBIN TOTAL: 0.5 mg/dL (ref 0.3–1.2)
BUN: 15 mg/dL (ref 6–20)
CHLORIDE: 104 mmol/L (ref 101–111)
CO2: 23 mmol/L (ref 22–32)
Calcium: 8.9 mg/dL (ref 8.9–10.3)
Creatinine, Ser: 0.98 mg/dL (ref 0.61–1.24)
GFR calc Af Amer: >60 mL/min (ref >60)
GLUCOSE: 111 mg/dL — AB (ref 65–99)
POTASSIUM: 3.9 mmol/L (ref 3.5–5.1)
Sodium: 134 mmol/L — ABNORMAL LOW (ref 135–145)
TOTAL PROTEIN: 6.7 g/dL (ref 6.5–8.1)

## 2016-01-11 LAB — SAMPLE TO BLOOD BANK

## 2016-01-11 MED ORDER — HYDROCORTISONE 2.5 % RE CREA: TOPICAL_CREAM | RECTAL | 1 refills | Status: DC

## 2016-01-11 NOTE — ED Notes (Signed)
Pt stated that he was unable to urinate at this time. Informed Thayer Ohm - RN.

## 2016-01-11 NOTE — ED Provider Notes (Addendum)
[facility]-EMERGENCY DEPT Provider Note   CSN: [id number] Arrival date & time: 01/11/16  1557  First Provider Contact:  None       History   Chief Complaint Chief Complaint  Patient presents with  . Rectal Bleeding    HPI Thomas Ortiz is a 60 y.o. male.  The history is provided by the patient. No language interpreter was used.  Rectal Bleeding  Quality:  Bright red Amount:  Moderate Timing:  Constant Chronicity:  New Context: hemorrhoids   Similar prior episodes: no   Relieved by:  Nothing Ineffective treatments:  None tried Associated symptoms: no dizziness, no light-headedness, no recent illness and no vomiting   Pt has a history of hemorrhoids.    Past Medical History:  Diagnosis Date  . Hypertension 2007    Patient Active Problem List   Diagnosis Date Noted  . Polyarthritis 07/05/2015  . HTN (hypertension) 06/14/2015  . Joint pain 06/14/2015    Past Surgical History:  Procedure Laterality Date  . bilateral foot sx  2011  . CHOLECYSTECTOMY N/A 01/30/2015   Procedure: LAPAROSCOPIC CHOLECYSTECTOMY WITH INTRAOPERATIVE CHOLANGIOGRAM;  Surgeon: Manus Rudd, MD;  Location: [facility] OR;  Service: General;  Laterality: N/A;  . COLONOSCOPY N/A 08/17/2015   Procedure: COLONOSCOPY;  Surgeon: West Bali, MD;  Location: [facility] ENDO SUITE;  Service: Endoscopy;  Laterality: N/A;  1:45 PM       Home Medications    Prior to Admission medications   Medication Sig Start Date End Date Taking? Authorizing Provider  acetaminophen-codeine (TYLENOL #3) 300-30 MG tablet Take 1 tablet by mouth every 8 (eight) hours as needed for moderate pain. 12/11/15   Josalyn Funches, MD  amLODipine (NORVASC) 10 MG tablet Take 1 tablet (10 mg total) by mouth daily. 09/20/15   Josalyn Funches, MD  indomethacin (INDOCIN) 25 MG capsule Take 1 capsule (25 mg total) by mouth 2 (two) times daily as needed for moderate pain. 09/20/15   Josalyn Funches, MD  olopatadine (PATANOL) 0.1 % ophthalmic solution Place 1  drop into both eyes 2 (two) times daily. 12/21/15   Anders Simmonds, PA-C    Family History History reviewed. No pertinent family history.  Social History Social History  Substance Use Topics  . Smoking status: Never Smoker  . Smokeless tobacco: Not on file  . Alcohol use No     Comment: occasionally     Allergies   Review of patient's allergies indicates no known allergies.   Review of Systems Review of Systems  Gastrointestinal: Positive for anal bleeding and hematochezia. Negative for vomiting.  Neurological: Negative for dizziness and light-headedness.     Physical Exam Updated Vital Signs BP 117/85 (BP Location: Left Arm)   Pulse 74   Temp 98.3 F (36.8 C) (Oral)   Resp 18   Ht 5\' 5"  (1.651 m)   Wt 74.6 kg   SpO2 99%   BMI 27.37 kg/m   Physical Exam  Constitutional: He appears well-developed and well-nourished.  HENT:  Head: Normocephalic and atraumatic.  Eyes: Conjunctivae are normal.  Neck: Neck supple.  Cardiovascular: Normal rate and regular rhythm.   No murmur heard. Pulmonary/Chest: Effort normal and breath sounds normal. No respiratory distress.  Abdominal: Soft. There is no tenderness.  Genitourinary: Rectal exam shows guaiac positive stool.  Musculoskeletal: He exhibits no edema.  Neurological: He is alert.  Skin: Skin is warm and dry.  Psychiatric: He has a normal mood and affect.  Nursing note and vitals reviewed.  ED Treatments / Results  Labs (all labs ordered are listed, but only abnormal results are displayed) Labs Reviewed  COMPREHENSIVE METABOLIC PANEL - Abnormal; Notable for the following:       Result Value   Sodium 134 (*)    Glucose, Bld 111 (*)    All other components within normal limits  CBC - Abnormal; Notable for the following:    RBC 4.08 (*)    Hemoglobin 12.6 (*)    HCT 38.0 (*)    All other components within normal limits  URINALYSIS, ROUTINE W REFLEX MICROSCOPIC (NOT AT [facility])  POC OCCULT BLOOD, ED  SAMPLE  TO BLOOD BANK    EKG  EKG Interpretation None       Radiology No results found.  Procedures Procedures (including critical care time)  Medications Ordered in ED Medications - No data to display   Initial Impression / Assessment and Plan / ED Course  I have reviewed the triage vital signs and the nursing notes.  Pertinent labs & imaging results that were available during my care of the patient were reviewed by me and considered in my medical decision making (see chart for details).  Clinical Course    Colonoscopy reviewed.  Pt has internal hemorrhoids which I suspect are cause of bleeding.  Pt advised to stop indocin for now and until seen and rechecked by primary   Final Clinical Impressions(s) / ED Diagnoses   Final diagnoses:  Rectal bleeding  Hemorrhoids, unspecified hemorrhoid type    New Prescriptions New Prescriptions   HYDROCORTISONE (ANUSOL-HC) 2.5 % RECTAL CREAM    Apply rectally 2 times daily     Elson Areas, PA-C 01/11/16 2138    Maia Plan, MD 01/12/16 [address] [place], PA-C 04/28/16 1150    Maia Plan, MD 04/28/16 [phone]

## 2016-01-11 NOTE — ED Triage Notes (Signed)
The pt is c/o bloody bms since yesterday  He has also had diarrhea  No pain   No previous history

## 2016-02-11 ENCOUNTER — Telehealth: Payer: Self-pay | Admitting: Family Medicine

## 2016-02-11 ENCOUNTER — Other Ambulatory Visit: Payer: Self-pay | Admitting: Family Medicine

## 2016-02-11 DIAGNOSIS — M130 Polyarthritis, unspecified: Secondary | ICD-10-CM

## 2016-02-11 MED FILL — INDOMETHACIN 25 MG CAPSULE: 25 | 15 days supply | Qty: 30 | Fill #0

## 2016-02-11 NOTE — Telephone Encounter (Signed)
Will route to PCP 

## 2016-02-11 NOTE — Telephone Encounter (Signed)
Patient is needing a refill on indocin. Please follow up.

## 2016-02-12 MED FILL — ?OLOPATADINE HCL 0.1% EYE D: 0.1% | 15 days supply | Qty: 5 | Fill #1

## 2016-02-12 NOTE — Telephone Encounter (Signed)
This was done yesterday.  

## 2016-02-13 NOTE — Telephone Encounter (Signed)
Pt was called on 8/30 and informed of his script being sent to the pharmacy.

## 2016-03-07 MED FILL — INDOMETHACIN 25 MG CAPSULE: 25 | 15 days supply | Qty: 30 | Fill #1

## 2016-03-25 ENCOUNTER — Ambulatory Visit: Payer: Self-pay | Attending: Family Medicine | Admitting: Family Medicine

## 2016-03-25 ENCOUNTER — Encounter: Payer: Self-pay | Admitting: Family Medicine

## 2016-03-25 VITALS — BP 132/86 | HR 75 | Temp 98.7°F | Ht 64.0 in | Wt 162.0 lb

## 2016-03-25 DIAGNOSIS — I10 Essential (primary) hypertension: Secondary | ICD-10-CM | POA: Insufficient documentation

## 2016-03-25 DIAGNOSIS — M130 Polyarthritis, unspecified: Secondary | ICD-10-CM | POA: Insufficient documentation

## 2016-03-25 DIAGNOSIS — G8929 Other chronic pain: Secondary | ICD-10-CM

## 2016-03-25 DIAGNOSIS — M79671 Pain in right foot: Secondary | ICD-10-CM | POA: Insufficient documentation

## 2016-03-25 DIAGNOSIS — M5441 Lumbago with sciatica, right side: Secondary | ICD-10-CM | POA: Insufficient documentation

## 2016-03-25 DIAGNOSIS — Z79899 Other long term (current) drug therapy: Secondary | ICD-10-CM | POA: Insufficient documentation

## 2016-03-25 DIAGNOSIS — M79672 Pain in left foot: Secondary | ICD-10-CM | POA: Insufficient documentation

## 2016-03-25 MED ORDER — METHYLPREDNISOLONE 4 MG PO TBPK: ORAL_TABLET | ORAL | 0 refills | Status: DC

## 2016-03-25 MED ORDER — ACETAMINOPHEN-CODEINE #3 300-30 MG PO TABS: 1.0000 | ORAL_TABLET | Freq: Three times a day (TID) | ORAL | 2 refills | Status: DC | PRN

## 2016-03-25 NOTE — Patient Instructions (Addendum)
Thomas Ortiz was seen today for back pain.  Diagnoses and all orders for this visit:  Chronic bilateral low back pain with right-sided sciatica -     methylPREDNISolone (MEDROL DOSEPAK) 4 MG TBPK tablet; Taper per packet insert -     DG Lumbar Spine Complete; Future  Pain in both feet -     DG Foot Complete Left; Future -     DG Foot Complete Right; Future  Polyarthritis -     acetaminophen-codeine (TYLENOL #3) 300-30 MG tablet; Take 1 tablet by mouth every 8 (eight) hours as needed for moderate pain.   F/u in 6 weeks for low back pain  Dr. Armen Pickup   Sciatica With Rehab The sciatic nerve runs from the back down the leg and is responsible for sensation and control of the muscles in the back (posterior) side of the thigh, lower leg, and foot. Sciatica is a condition that is characterized by inflammation of this nerve.  SYMPTOMS   Signs of nerve damage, including numbness and/or weakness along the posterior side of the lower extremity.  Pain in the back of the thigh that may also travel down the leg.  Pain that worsens when sitting for long periods of time.  Occasionally, pain in the back or buttock. CAUSES  Inflammation of the sciatic nerve is the cause of sciatica. The inflammation is due to something irritating the nerve. Common sources of irritation include:  Sitting for long periods of time.  Direct trauma to the nerve.  Arthritis of the spine.  Herniated or ruptured disk.  Slipping of the vertebrae (spondylolisthesis).  Pressure from soft tissues, such as muscles or ligament-like tissue (fascia). RISK INCREASES WITH:  Sports that place pressure or stress on the spine (football or weightlifting).  Poor strength and flexibility.  Failure to warm up properly before activity.  Family history of low back pain or disk disorders.  Previous back injury or surgery.  Poor body mechanics, especially when lifting, or poor posture. PREVENTION   Warm up and stretch properly  before activity.  Maintain physical fitness:  Strength, flexibility, and endurance.  Cardiovascular fitness.  Learn and use proper technique, especially with posture and lifting. When possible, have coach correct improper technique.  Avoid activities that place stress on the spine. PROGNOSIS If treated properly, then sciatica usually resolves within 6 weeks. However, occasionally surgery is necessary.  RELATED COMPLICATIONS   Permanent nerve damage, including pain, numbness, tingle, or weakness.  Chronic back pain.  Risks of surgery: infection, bleeding, nerve damage, or damage to surrounding tissues. TREATMENT Treatment initially involves resting from any activities that aggravate your symptoms. The use of ice and medication may help reduce pain and inflammation. The use of strengthening and stretching exercises may help reduce pain with activity. These exercises may be performed at home or with referral to a therapist. A therapist may recommend further treatments, such as transcutaneous electronic nerve stimulation (TENS) or ultrasound. Your caregiver may recommend corticosteroid injections to help reduce inflammation of the sciatic nerve. If symptoms persist despite non-surgical (conservative) treatment, then surgery may be recommended. MEDICATION  If pain medication is necessary, then nonsteroidal anti-inflammatory medications, such as aspirin and ibuprofen, or other minor pain relievers, such as acetaminophen, are often recommended.  Do not take pain medication for 7 days before surgery.  Prescription pain relievers may be given if deemed necessary by your caregiver. Use only as directed and only as much as you need.  Ointments applied to the skin may be helpful.  Corticosteroid injections may be given by your caregiver. These injections should be reserved for the most serious cases, because they may only be given a certain number of times. HEAT AND COLD  Cold treatment  (icing) relieves pain and reduces inflammation. Cold treatment should be applied for 10 to 15 minutes every 2 to 3 hours for inflammation and pain and immediately after any activity that aggravates your symptoms. Use ice packs or massage the area with a piece of ice (ice massage).  Heat treatment may be used prior to performing the stretching and strengthening activities prescribed by your caregiver, physical therapist, or athletic trainer. Use a heat pack or soak the injury in warm water. SEEK MEDICAL CARE IF:  Treatment seems to offer no benefit, or the condition worsens.  Any medications produce adverse side effects. EXERCISES  RANGE OF MOTION (ROM) AND STRETCHING EXERCISES - Sciatica Most people with sciatic will find that their symptoms worsen with either excessive bending forward (flexion) or arching at the low back (extension). The exercises which will help resolve your symptoms will focus on the opposite motion. Your physician, physical therapist or athletic trainer will help you determine which exercises will be most helpful to resolve your low back pain. Do not complete any exercises without first consulting with your clinician. Discontinue any exercises which worsen your symptoms until you speak to your clinician. If you have pain, numbness or tingling which travels down into your buttocks, leg or foot, the goal of the therapy is for these symptoms to move closer to your back and eventually resolve. Occasionally, these leg symptoms will get better, but your low back pain may worsen; this is typically an indication of progress in your rehabilitation. Be certain to be very alert to any changes in your symptoms and the activities in which you participated in the 24 hours prior to the change. Sharing this information with your clinician will allow him/her to most efficiently treat your condition. These exercises may help you when beginning to rehabilitate your injury. Your symptoms may resolve with  or without further involvement from your physician, physical therapist or athletic trainer. While completing these exercises, remember:   Restoring tissue flexibility helps normal motion to return to the joints. This allows healthier, less painful movement and activity.  An effective stretch should be held for at least 30 seconds.  A stretch should never be painful. You should only feel a gentle lengthening or release in the stretched tissue. FLEXION RANGE OF MOTION AND STRETCHING EXERCISES: STRETCH - Flexion, Single Knee to Chest   Lie on a firm bed or floor with both legs extended in front of you.  Keeping one leg in contact with the floor, bring your opposite knee to your chest. Hold your leg in place by either grabbing behind your thigh or at your knee.  Pull until you feel a gentle stretch in your low back. Hold __________ seconds.  Slowly release your grasp and repeat the exercise with the opposite side. Repeat __________ times. Complete this exercise __________ times per day.  STRETCH - Flexion, Double Knee to Chest  Lie on a firm bed or floor with both legs extended in front of you.  Keeping one leg in contact with the floor, bring your opposite knee to your chest.  Tense your stomach muscles to support your back and then lift your other knee to your chest. Hold your legs in place by either grabbing behind your thighs or at your knees.  Pull both knees toward  your chest until you feel a gentle stretch in your low back. Hold __________ seconds.  Tense your stomach muscles and slowly return one leg at a time to the floor. Repeat __________ times. Complete this exercise __________ times per day.  STRETCH - Low Trunk Rotation   Lie on a firm bed or floor. Keeping your legs in front of you, bend your knees so they are both pointed toward the ceiling and your feet are flat on the floor.  Extend your arms out to the side. This will stabilize your upper body by keeping your  shoulders in contact with the floor.  Gently and slowly drop both knees together to one side until you feel a gentle stretch in your low back. Hold for __________ seconds.  Tense your stomach muscles to support your low back as you bring your knees back to the starting position. Repeat the exercise to the other side. Repeat __________ times. Complete this exercise __________ times per day  EXTENSION RANGE OF MOTION AND FLEXIBILITY EXERCISES: STRETCH - Extension, Prone on Elbows  Lie on your stomach on the floor, a bed will be too soft. Place your palms about shoulder width apart and at the height of your head.  Place your elbows under your shoulders. If this is too painful, stack pillows under your chest.  Allow your body to relax so that your hips drop lower and make contact more completely with the floor.  Hold this position for __________ seconds.  Slowly return to lying flat on the floor. Repeat __________ times. Complete this exercise __________ times per day.  RANGE OF MOTION - Extension, Prone Press Ups  Lie on your stomach on the floor, a bed will be too soft. Place your palms about shoulder width apart and at the height of your head.  Keeping your back as relaxed as possible, slowly straighten your elbows while keeping your hips on the floor. You may adjust the placement of your hands to maximize your comfort. As you gain motion, your hands will come more underneath your shoulders.  Hold this position __________ seconds.  Slowly return to lying flat on the floor. Repeat __________ times. Complete this exercise __________ times per day.  STRENGTHENING EXERCISES - Sciatica  These exercises may help you when beginning to rehabilitate your injury. These exercises should be done near your "sweet spot." This is the neutral, low-back arch, somewhere between fully rounded and fully arched, that is your least painful position. When performed in this safe range of motion, these exercises  can be used for people who have either a flexion or extension based injury. These exercises may resolve your symptoms with or without further involvement from your physician, physical therapist or athletic trainer. While completing these exercises, remember:   Muscles can gain both the endurance and the strength needed for everyday activities through controlled exercises.  Complete these exercises as instructed by your physician, physical therapist or athletic trainer. Progress with the resistance and repetition exercises only as your caregiver advises.  You may experience muscle soreness or fatigue, but the pain or discomfort you are trying to eliminate should never worsen during these exercises. If this pain does worsen, stop and make certain you are following the directions exactly. If the pain is still present after adjustments, discontinue the exercise until you can discuss the trouble with your clinician. STRENGTHENING - Deep Abdominals, Pelvic Tilt   Lie on a firm bed or floor. Keeping your legs in front of you, bend your knees  so they are both pointed toward the ceiling and your feet are flat on the floor.  Tense your lower abdominal muscles to press your low back into the floor. This motion will rotate your pelvis so that your tail bone is scooping upwards rather than pointing at your feet or into the floor.  With a gentle tension and even breathing, hold this position for __________ seconds. Repeat __________ times. Complete this exercise __________ times per day.  STRENGTHENING - Abdominals, Crunches   Lie on a firm bed or floor. Keeping your legs in front of you, bend your knees so they are both pointed toward the ceiling and your feet are flat on the floor. Cross your arms over your chest.  Slightly tip your chin down without bending your neck.  Tense your abdominals and slowly lift your trunk high enough to just clear your shoulder blades. Lifting higher can put excessive stress on  the low back and does not further strengthen your abdominal muscles.  Control your return to the starting position. Repeat __________ times. Complete this exercise __________ times per day.  STRENGTHENING - Quadruped, Opposite UE/LE Lift  Assume a hands and knees position on a firm surface. Keep your hands under your shoulders and your knees under your hips. You may place padding under your knees for comfort.  Find your neutral spine and gently tense your abdominal muscles so that you can maintain this position. Your shoulders and hips should form a rectangle that is parallel with the floor and is not twisted.  Keeping your trunk steady, lift your right hand no higher than your shoulder and then your left leg no higher than your hip. Make sure you are not holding your breath. Hold this position __________ seconds.  Continuing to keep your abdominal muscles tense and your back steady, slowly return to your starting position. Repeat with the opposite arm and leg. Repeat __________ times. Complete this exercise __________ times per day.  STRENGTHENING - Abdominals and Quadriceps, Straight Leg Raise   Lie on a firm bed or floor with both legs extended in front of you.  Keeping one leg in contact with the floor, bend the other knee so that your foot can rest flat on the floor.  Find your neutral spine, and tense your abdominal muscles to maintain your spinal position throughout the exercise.  Slowly lift your straight leg off the floor about 6 inches for a count of 15, making sure to not hold your breath.  Still keeping your neutral spine, slowly lower your leg all the way to the floor. Repeat this exercise with each leg __________ times. Complete this exercise __________ times per day. POSTURE AND BODY MECHANICS CONSIDERATIONS - Sciatica Keeping correct posture when sitting, standing or completing your activities will reduce the stress put on different body tissues, allowing injured tissues a  chance to heal and limiting painful experiences. The following are general guidelines for improved posture. Your physician or physical therapist will provide you with any instructions specific to your needs. While reading these guidelines, remember:  The exercises prescribed by your provider will help you have the flexibility and strength to maintain correct postures.  The correct posture provides the optimal environment for your joints to work. All of your joints have less wear and tear when properly supported by a spine with good posture. This means you will experience a healthier, less painful body.  Correct posture must be practiced with all of your activities, especially prolonged sitting and standing. Correct  posture is as important when doing repetitive low-stress activities (typing) as it is when doing a single heavy-load activity (lifting). RESTING POSITIONS Consider which positions are most painful for you when choosing a resting position. If you have pain with flexion-based activities (sitting, bending, stooping, squatting), choose a position that allows you to rest in a less flexed posture. You would want to avoid curling into a fetal position on your side. If your pain worsens with extension-based activities (prolonged standing, working overhead), avoid resting in an extended position such as sleeping on your stomach. Most people will find more comfort when they rest with their spine in a more neutral position, neither too rounded nor too arched. Lying on a non-sagging bed on your side with a pillow between your knees, or on your back with a pillow under your knees will often provide some relief. Keep in mind, being in any one position for a prolonged period of time, no matter how correct your posture, can still lead to stiffness. PROPER SITTING POSTURE In order to minimize stress and discomfort on your spine, you must sit with correct posture Sitting with good posture should be effortless for  a healthy body. Returning to good posture is a gradual process. Many people can work toward this most comfortably by using various supports until they have the flexibility and strength to maintain this posture on their own. When sitting with proper posture, your ears will fall over your shoulders and your shoulders will fall over your hips. You should use the back of the chair to support your upper back. Your low back will be in a neutral position, just slightly arched. You may place a small pillow or folded towel at the base of your low back for support.  When working at a desk, create an environment that supports good, upright posture. Without extra support, muscles fatigue and lead to excessive strain on joints and other tissues. Keep these recommendations in mind: CHAIR:   A chair should be able to slide under your desk when your back makes contact with the back of the chair. This allows you to work closely.  The chair's height should allow your eyes to be level with the upper part of your monitor and your hands to be slightly lower than your elbows. BODY POSITION  Your feet should make contact with the floor. If this is not possible, use a foot rest.  Keep your ears over your shoulders. This will reduce stress on your neck and low back. INCORRECT SITTING POSTURES   If you are feeling tired and unable to assume a healthy sitting posture, do not slouch or slump. This puts excessive strain on your back tissues, causing more damage and pain. Healthier options include:  Using more support, like a lumbar pillow.  Switching tasks to something that requires you to be upright or walking.  Talking a brief walk.  Lying down to rest in a neutral-spine position. PROLONGED STANDING WHILE SLIGHTLY LEANING FORWARD  When completing a task that requires you to lean forward while standing in one place for a long time, place either foot up on a stationary 2-4 inch high object to help maintain the best  posture. When both feet are on the ground, the low back tends to lose its slight inward curve. If this curve flattens (or becomes too large), then the back and your other joints will experience too much stress, fatigue more quickly and can cause pain.  CORRECT STANDING POSTURES Proper standing posture should be  assumed with all daily activities, even if they only take a few moments, like when brushing your teeth. As in sitting, your ears should fall over your shoulders and your shoulders should fall over your hips. You should keep a slight tension in your abdominal muscles to brace your spine. Your tailbone should point down to the ground, not behind your body, resulting in an over-extended swayback posture.  INCORRECT STANDING POSTURES  Common incorrect standing postures include a forward head, locked knees and/or an excessive swayback. WALKING Walk with an upright posture. Your ears, shoulders and hips should all line-up. PROLONGED ACTIVITY IN A FLEXED POSITION When completing a task that requires you to bend forward at your waist or lean over a low surface, try to find a way to stabilize 3 of 4 of your limbs. You can place a hand or elbow on your thigh or rest a knee on the surface you are reaching across. This will provide you more stability so that your muscles do not fatigue as quickly. By keeping your knees relaxed, or slightly bent, you will also reduce stress across your low back. CORRECT LIFTING TECHNIQUES DO :   Assume a wide stance. This will provide you more stability and the opportunity to get as close as possible to the object which you are lifting.  Tense your abdominals to brace your spine; then bend at the knees and hips. Keeping your back locked in a neutral-spine position, lift using your leg muscles. Lift with your legs, keeping your back straight.  Test the weight of unknown objects before attempting to lift them.  Try to keep your elbows locked down at your sides in order get  the best strength from your shoulders when carrying an object.  Always ask for help when lifting heavy or awkward objects. INCORRECT LIFTING TECHNIQUES DO NOT:   Lock your knees when lifting, even if it is a small object.  Bend and twist. Pivot at your feet or move your feet when needing to change directions.  Assume that you cannot safely pick up a paperclip without proper posture.   This information is not intended to replace advice given to you by your health care provider. Make sure you discuss any questions you have with your health care provider.   Document Released: 06/02/2005 Document Revised: 10/17/2014 Document Reviewed: 09/14/2008 [employer] Interactive Patient Education [employer]

## 2016-03-25 NOTE — Progress Notes (Signed)
Subjective:  Patient ID: Thomas Ortiz, male    DOB: 1955/10/06  Age: 60 y.o. MRN: [id number]  CC: Back Pain   HPI Thomas Ortiz presents for   1. Back pain: x 2 months. Bilateral low back with pain radiating down legs when bending forward. Achy pain. Constant. No fecal or urinary incontinence.   2. Foot pain: b/l lateral foot pain. Has hx of R ankle and foot fracture with surgical fixation. No swelling in feet.   Social History  Substance Use Topics  . Smoking status: Never Smoker  . Smokeless tobacco: Not on file  . Alcohol use No     Comment: occasionally    Past Medical History:  Diagnosis Date  . Hypertension 2007   Past Surgical History:  Procedure Laterality Date  . bilateral foot sx  2011  . CHOLECYSTECTOMY N/A 01/30/2015   Procedure: LAPAROSCOPIC CHOLECYSTECTOMY WITH INTRAOPERATIVE CHOLANGIOGRAM;  Surgeon: Manus Rudd, MD;  Location: [facility] OR;  Service: General;  Laterality: N/A;  . COLONOSCOPY N/A 08/17/2015   Procedure: COLONOSCOPY;  Surgeon: West Bali, MD;  Location: [facility];  Service: Endoscopy;  Laterality: N/A;  1:45 PM   Outpatient Medications Prior to Visit  Medication Sig Dispense Refill  . acetaminophen-codeine (TYLENOL #3) 300-30 MG tablet Take 1 tablet by mouth every 8 (eight) hours as needed for moderate pain. 60 tablet 2  . amLODipine (NORVASC) 10 MG tablet Take 1 tablet (10 mg total) by mouth daily. 30 tablet 5  . hydrocortisone (ANUSOL-HC) 2.5 % rectal cream Apply rectally 2 times daily 28 g 1  . indomethacin (INDOCIN) 25 MG capsule TAKE 1 CAPSULE BY MOUTH 2 TIMES DAILY AS NEEDED FOR MODERATE PAIN. 30 capsule 1  . olopatadine (PATANOL) 0.1 % ophthalmic solution Place 1 drop into both eyes 2 (two) times daily. 5 mL 12   No facility-administered medications prior to visit.     ROS Review of Systems  Constitutional: Negative for chills, fatigue, fever and unexpected weight change.  Eyes: Negative for visual disturbance.  Respiratory:  Negative for cough and shortness of breath.   Cardiovascular: Negative for chest pain, palpitations and leg swelling.  Gastrointestinal: Negative for abdominal pain, blood in stool, constipation, diarrhea, nausea and vomiting.  Endocrine: Negative for polydipsia, polyphagia and polyuria.  Musculoskeletal: Positive for arthralgias, back pain and gait problem. Negative for myalgias and neck pain.  Skin: Negative for rash.  Allergic/Immunologic: Negative for immunocompromised state.  Hematological: Negative for adenopathy. Does not bruise/bleed easily.  Psychiatric/Behavioral: Negative for dysphoric mood, sleep disturbance and suicidal ideas. The patient is not nervous/anxious.     Objective:  BP 132/86   Pulse 75   Temp 98.7 F (37.1 C) (Oral)   Ht 5\' 4"  (1.626 m)   Wt 162 lb (73.5 kg)   SpO2 97%   BMI 27.81 kg/m   BP/Weight 03/25/2016 01/11/2016 12/21/2015  Systolic BP 132 124 133  Diastolic BP 86 82 86  Wt. (Lbs) 162 164.5 164  BMI 27.81 27.37 28.14      Physical Exam  Constitutional: He appears well-developed and well-nourished. No distress.  HENT:  Head: Normocephalic and atraumatic.  Neck: Normal range of motion. Neck supple.  Pulmonary/Chest: Effort normal.  Musculoskeletal: He exhibits no edema.       Left foot: There is tenderness and bony tenderness.       Feet:  Back Exam: Back: Normal Curvature, no deformities or CVA tenderness  Paraspinal Tenderness: lower lumbar   LE Strength 5/5  LE  Sensation: in tact  LE Reflexes 2+ and symmetric  Straight leg raise: + on R   Decreased ROM and healed surgical scars at anterior ankles   Neurological: He is alert.  Skin: Skin is warm and dry. No rash noted. No erythema.  Psychiatric: He has a normal mood and affect.     Assessment & Plan:  Thomas Ortiz was seen today for back pain.  Diagnoses and all orders for this visit:  Chronic bilateral low back pain with right-sided sciatica -     methylPREDNISolone (MEDROL  DOSEPAK) 4 MG TBPK tablet; Taper per packet insert -     DG Lumbar Spine Complete; Future  Pain in both feet -     DG Foot Complete Left; Future -     DG Foot Complete Right; Future  Polyarthritis -     acetaminophen-codeine (TYLENOL #3) 300-30 MG tablet; Take 1 tablet by mouth every 8 (eight) hours as needed for moderate pain.   There are no diagnoses linked to this encounter.  No orders of the defined types were placed in this encounter.   Follow-up: Return in about 6 weeks (around 05/06/2016) for back pain .   Dessa PhiJosalyn Kristien Salatino MD

## 2016-03-26 DIAGNOSIS — M79671 Pain in right foot: Secondary | ICD-10-CM | POA: Insufficient documentation

## 2016-03-26 DIAGNOSIS — M5441 Lumbago with sciatica, right side: Secondary | ICD-10-CM

## 2016-03-26 DIAGNOSIS — M79672 Pain in left foot: Secondary | ICD-10-CM

## 2016-03-26 DIAGNOSIS — G8929 Other chronic pain: Secondary | ICD-10-CM | POA: Insufficient documentation

## 2016-03-26 NOTE — Assessment & Plan Note (Signed)
A: 2 months for low back pain with radicular symptoms.  P:  Steroid back Tylenol #3 for pain control Home PT

## 2016-03-26 NOTE — Assessment & Plan Note (Signed)
Pain in both feet  Hx of traumatic injury No ulceration  Plan:foot x-ray to evaluate for fracture

## 2016-04-09 MED FILL — ACETAMINOPHEN/COD #3 TABLET: 300-30 | 20 days supply | Qty: 60 | Fill #0

## 2016-04-16 ENCOUNTER — Other Ambulatory Visit: Payer: Self-pay | Admitting: Family Medicine

## 2016-04-16 DIAGNOSIS — M130 Polyarthritis, unspecified: Secondary | ICD-10-CM

## 2016-04-16 MED FILL — OLOPATADINE HCL 0.1% EYE DR: 0.1 | 15 days supply | Qty: 5 | Fill #2

## 2016-04-17 MED FILL — INDOMETHACIN 25 MG CAPSULE: 25 | 15 days supply | Qty: 30 | Fill #0

## 2016-06-06 MED FILL — OLOPATADINE HCL 0.1% EYE DR: 0.1 | 15 days supply | Qty: 5 | Fill #3

## 2016-06-13 ENCOUNTER — Ambulatory Visit: Payer: Self-pay | Attending: Family Medicine

## 2016-08-08 ENCOUNTER — Ambulatory Visit: Payer: Self-pay | Admitting: Family Medicine

## 2016-08-29 MED FILL — ACETAMINOPHEN/COD #3 TABLET: 300-30 | 20 days supply | Qty: 60 | Fill #1

## 2016-09-15 ENCOUNTER — Ambulatory Visit: Payer: Self-pay | Attending: Family Medicine | Admitting: Family Medicine

## 2016-09-15 ENCOUNTER — Encounter: Payer: Self-pay | Admitting: Family Medicine

## 2016-09-15 VITALS — BP 152/95 | HR 71 | Temp 98.1°F | Ht 64.0 in | Wt 160.8 lb

## 2016-09-15 DIAGNOSIS — H259 Unspecified age-related cataract: Secondary | ICD-10-CM

## 2016-09-15 DIAGNOSIS — Z79899 Other long term (current) drug therapy: Secondary | ICD-10-CM | POA: Insufficient documentation

## 2016-09-15 DIAGNOSIS — K0889 Other specified disorders of teeth and supporting structures: Secondary | ICD-10-CM

## 2016-09-15 DIAGNOSIS — M79606 Pain in leg, unspecified: Secondary | ICD-10-CM | POA: Insufficient documentation

## 2016-09-15 DIAGNOSIS — I10 Essential (primary) hypertension: Secondary | ICD-10-CM

## 2016-09-15 DIAGNOSIS — G8929 Other chronic pain: Secondary | ICD-10-CM | POA: Insufficient documentation

## 2016-09-15 DIAGNOSIS — M130 Polyarthritis, unspecified: Secondary | ICD-10-CM

## 2016-09-15 MED ORDER — ACETAMINOPHEN-CODEINE #3 300-30 MG PO TABS: 1.0000 | ORAL_TABLET | Freq: Three times a day (TID) | ORAL | 0 refills | Status: DC | PRN

## 2016-09-15 MED ORDER — INDOMETHACIN 25 MG PO CAPS: ORAL_CAPSULE | ORAL | 2 refills | Status: DC

## 2016-09-15 MED ORDER — ACETAMINOPHEN-CODEINE #3 300-30 MG PO TABS: 1.0000 | ORAL_TABLET | Freq: Three times a day (TID) | ORAL | 2 refills | Status: DC | PRN

## 2016-09-15 MED ORDER — AMLODIPINE BESYLATE 10 MG PO TABS: 10.0000 mg | ORAL_TABLET | Freq: Every day | ORAL | 5 refills | Status: DC

## 2016-09-15 MED ORDER — AMOXICILLIN-POT CLAVULANATE 875-125 MG PO TABS: 1.0000 | ORAL_TABLET | Freq: Two times a day (BID) | ORAL | 0 refills | Status: DC

## 2016-09-15 MED FILL — AMLODIPINE BESYLATE 10 MG T: 10 | 30 days supply | Qty: 30 | Fill #0

## 2016-09-15 MED FILL — ACETAMINOPHEN/COD #3 TABLET: 300-30 | 7 days supply | Qty: 21 | Fill #0

## 2016-09-15 MED FILL — AMOX-CLAV 875-125 MG TABLET: 875-125 | 10 days supply | Qty: 20 | Fill #0

## 2016-09-15 MED FILL — INDOMETHACIN 25 MG CAPSULE: 25 | 15 days supply | Qty: 30 | Fill #0

## 2016-09-15 NOTE — Patient Instructions (Addendum)
Thomas Ortiz was seen today for dental problem and hypertension.  Diagnoses and all orders for this visit:  Pain of molar -     amoxicillin-clavulanate (AUGMENTIN) 875-125 MG tablet; Take 1 tablet by mouth 2 (two) times daily. -     Ambulatory referral to Dentistry -     Discontinue: acetaminophen-codeine (TYLENOL #3) 300-30 MG tablet; Take 1 tablet by mouth every 8 (eight) hours as needed for moderate pain. -     acetaminophen-codeine (TYLENOL #3) 300-30 MG tablet; Take 1 tablet by mouth every 8 (eight) hours as needed for moderate pain.  Essential hypertension -     amLODipine (NORVASC) 10 MG tablet; Take 1 tablet (10 mg total) by mouth daily.  Polyarthritis -     Discontinue: acetaminophen-codeine (TYLENOL #3) 300-30 MG tablet; Take 1 tablet by mouth every 8 (eight) hours as needed for moderate pain. -     indomethacin (INDOCIN) 25 MG capsule; TAKE 1 CAPSULE BY MOUTH 2 TIMES DAILY AS NEEDED FOR MODERATE PAIN.  Age-related cataract of both eyes, unspecified age-related cataract type -     Ambulatory referral to Ophthalmology   f/u in 2 weeks for RN BP check f/u with me in 3 months for HTN  Dr. Armen Pickup

## 2016-09-15 NOTE — Progress Notes (Signed)
Pt is here for dental referral.  Pt would like a refill on indomethacin.

## 2016-09-15 NOTE — Assessment & Plan Note (Signed)
Opthalmology referral placed  

## 2016-09-15 NOTE — Progress Notes (Signed)
Subjective:  Patient ID: Thomas Ortiz, male    DOB: 1956-06-07  Age: 61 y.o. MRN: [id number]  CC: Dental Problem and Hypertension   HPI Thomas Ortiz presents for   1. Dental problem: this is a long term problem. R upper and lower molar pain with eating and drinking. He does not eat or drink on that side. He has had molars removed from the L side. He denies oral swelling or fever.   2. Vision problems: his vision is cloudy. He has not seen eye doctor. No vision pain or tearing.   3. HTN: not taking norvasc. No HA, CP or SOB. No leg swelling.,   4. Chronic leg pain: improved with indocin. Request refill.     Social History  Substance Use Topics  . Smoking status: Never Smoker  . Smokeless tobacco: Never Used  . Alcohol use No     Comment: occasionally   Outpatient Medications Prior to Visit  Medication Sig Dispense Refill  . acetaminophen-codeine (TYLENOL #3) 300-30 MG tablet Take 1 tablet by mouth every 8 (eight) hours as needed for moderate pain. 60 tablet 2  . indomethacin (INDOCIN) 25 MG capsule TAKE 1 CAPSULE BY MOUTH 2 TIMES DAILY AS NEEDED FOR MODERATE PAIN. 30 capsule 0  . amLODipine (NORVASC) 10 MG tablet Take 1 tablet (10 mg total) by mouth daily. (Patient not taking: Reported on 09/15/2016) 30 tablet 5  . hydrocortisone (ANUSOL-HC) 2.5 % rectal cream Apply rectally 2 times daily (Patient not taking: Reported on 09/15/2016) 28 g 1  . methylPREDNISolone (MEDROL DOSEPAK) 4 MG TBPK tablet Taper per packet insert (Patient not taking: Reported on 09/15/2016) 21 tablet 0  . olopatadine (PATANOL) 0.1 % ophthalmic solution Place 1 drop into both eyes 2 (two) times daily. (Patient not taking: Reported on 09/15/2016) 5 mL 12   No facility-administered medications prior to visit.     ROS Review of Systems  Constitutional: Negative for chills, fatigue, fever and unexpected weight change.  HENT: Positive for dental problem.   Eyes: Positive for visual disturbance.  Respiratory: Negative  for cough and shortness of breath.   Cardiovascular: Negative for chest pain, palpitations and leg swelling.  Gastrointestinal: Negative for abdominal pain, blood in stool, constipation, diarrhea, nausea and vomiting.  Endocrine: Negative for polydipsia, polyphagia and polyuria.  Musculoskeletal: Positive for arthralgias and myalgias. Negative for back pain, gait problem and neck pain.  Skin: Negative for rash.  Allergic/Immunologic: Negative for immunocompromised state.  Hematological: Negative for adenopathy. Does not bruise/bleed easily.  Psychiatric/Behavioral: Negative for dysphoric mood, sleep disturbance and suicidal ideas. The patient is not nervous/anxious.     Objective:  BP (!) 152/95   Pulse 71   Temp 98.1 F (36.7 C) (Oral)   Ht  (1.626 m)   Wt 160 lb 12.8 oz (72.9 kg)   SpO2 98%   BMI 27.60 kg/m   BP/Weight 09/15/2016 03/25/2016 01/11/2016  Systolic BP 152 132 124  Diastolic BP 95 86 82  Wt. (Lbs) 160.8 162 164.5  BMI 27.6 27.81 27.37    Physical Exam  Constitutional: He appears well-developed and well-nourished. No distress.  HENT:  Head: Normocephalic and atraumatic.  Mouth/Throat:    Eyes:    Neck: Normal range of motion. Neck supple.  Cardiovascular: Normal rate, regular rhythm, normal heart sounds and intact distal pulses.   Pulmonary/Chest: Effort normal and breath sounds normal.  Musculoskeletal: He exhibits no edema.  Neurological: He is alert.  Skin: Skin is warm and dry. No  rash noted. No erythema.  Psychiatric: He has a normal mood and affect.    Assessment & Plan:  Overton was seen today for dental problem and hypertension.  Diagnoses and all orders for this visit:  Pain of molar -     amoxicillin-clavulanate (AUGMENTIN) 875-125 MG tablet; Take 1 tablet by mouth 2 (two) times daily. -     Ambulatory referral to Dentistry -     Discontinue: acetaminophen-codeine (TYLENOL #3) 300-30 MG tablet; Take 1 tablet by mouth every 8 (eight) hours  as needed for moderate pain. -     acetaminophen-codeine (TYLENOL #3) 300-30 MG tablet; Take 1 tablet by mouth every 8 (eight) hours as needed for moderate pain.  Essential hypertension -     amLODipine (NORVASC) 10 MG tablet; Take 1 tablet (10 mg total) by mouth daily.  Polyarthritis -     Discontinue: acetaminophen-codeine (TYLENOL #3) 300-30 MG tablet; Take 1 tablet by mouth every 8 (eight) hours as needed for moderate pain. -     indomethacin (INDOCIN) 25 MG capsule; TAKE 1 CAPSULE BY MOUTH 2 TIMES DAILY AS NEEDED FOR MODERATE PAIN.  Age-related cataract of both eyes, unspecified age-related cataract type -     Ambulatory referral to Ophthalmology   There are no diagnoses linked to this encounter.  No orders of the defined types were placed in this encounter.   Follow-up: Return in about 2 weeks (around 09/29/2016) for RN BP check .   Dessa Phi MD

## 2016-09-15 NOTE — Assessment & Plan Note (Signed)
A: HTN  Med: none P: Restart Norvasc 10 mg daily

## 2016-09-15 NOTE — Assessment & Plan Note (Signed)
R upper and lower molar pain. Chipped teeth. No abscess Plan: Dental referral Tylenol #3 and Augmentin to treat pain

## 2016-10-06 ENCOUNTER — Ambulatory Visit: Payer: Self-pay | Attending: Family Medicine | Admitting: *Deleted

## 2016-10-06 VITALS — BP 118/80 | HR 71

## 2016-10-06 DIAGNOSIS — I10 Essential (primary) hypertension: Secondary | ICD-10-CM | POA: Insufficient documentation

## 2016-10-06 NOTE — Patient Instructions (Addendum)
Not less than 90/60  Less than 130/80  Not greater than 140/90

## 2016-10-06 NOTE — Progress Notes (Signed)
Pt here for f/u BP check after office visit on 09/15/16 with  Dr. Armen Pickup. Pt denies chest pain, SOB, HA, new vison concerns, or generalized swelling. He c/o dizziness when he is on the He did not take his medication this morning for his BP check.  He states he takes his medications daily. Blood pressure taken manually while patient is sitting.   118/80 and 120/78  Nurse visit will be routed to provider.Thomas Franco, RN, BSN

## 2016-10-29 ENCOUNTER — Encounter: Payer: Self-pay | Admitting: Family Medicine

## 2016-12-09 MED FILL — AMLODIPINE BESYLATE 10 MG T: 10 | 20 days supply | Qty: 20 | Fill #1

## 2016-12-10 MED FILL — AMLODIPINE BESYLATE 10 MG T: 10 | 19 days supply | Qty: 19 | Fill #2

## 2017-01-06 ENCOUNTER — Other Ambulatory Visit: Payer: Self-pay | Admitting: Family Medicine

## 2017-01-06 DIAGNOSIS — K0889 Other specified disorders of teeth and supporting structures: Secondary | ICD-10-CM

## 2017-01-06 MED FILL — AMLODIPINE BESYLATE 10 MG T: 10 | 30 days supply | Qty: 30 | Fill #3

## 2017-01-08 ENCOUNTER — Ambulatory Visit: Payer: Self-pay | Admitting: Family Medicine

## 2017-01-08 NOTE — Progress Notes (Deleted)
   Subjective:  Patient ID: Thomas Ortiz, male    DOB: 1956/02/11  Age: 61 y.o. MRN: [id number]  CC: No chief complaint on file.   HPI Thomas QualeJose Ardelean presents for ***  Outpatient Medications Prior to Visit  Medication Sig Dispense Refill  . acetaminophen-codeine (TYLENOL #3) 300-30 MG tablet Take 1 tablet by mouth every 8 (eight) hours as needed for moderate pain. 21 tablet 0  . amLODipine (NORVASC) 10 MG tablet Take 1 tablet (10 mg total) by mouth daily. 30 tablet 5  . amoxicillin-clavulanate (AUGMENTIN) 875-125 MG tablet Take 1 tablet by mouth 2 (two) times daily. (Patient not taking: Reported on 10/06/2016) 20 tablet 0  . indomethacin (INDOCIN) 25 MG capsule TAKE 1 CAPSULE BY MOUTH 2 TIMES DAILY AS NEEDED FOR MODERATE PAIN. 30 capsule 2   No facility-administered medications prior to visit.     ROS Review of Systems  Review of Systems - {ros [id number]}    Objective:  There were no vitals taken for this visit.  BP/Weight 10/06/2016 09/15/2016 03/25/2016  Systolic BP 118 152 132  Diastolic BP 80 95 86  Wt. (Lbs) - 160.8 162  BMI - 27.6 27.81     Physical Exam   Assessment & Plan:   Problem List Items Addressed This Visit    None      No orders of the defined types were placed in this encounter.   Follow-up: No Follow-up on file.   Lizbeth BarkMandesia R Acire Tang FNP

## 2017-02-26 ENCOUNTER — Encounter: Payer: Self-pay | Admitting: Physician Assistant

## 2017-02-26 ENCOUNTER — Ambulatory Visit: Payer: Self-pay | Attending: Internal Medicine | Admitting: Physician Assistant

## 2017-02-26 VITALS — BP 136/83 | HR 63 | Temp 98.2°F | Resp 18 | Ht 63.0 in | Wt 145.0 lb

## 2017-02-26 DIAGNOSIS — M79671 Pain in right foot: Secondary | ICD-10-CM | POA: Insufficient documentation

## 2017-02-26 DIAGNOSIS — M79672 Pain in left foot: Secondary | ICD-10-CM | POA: Insufficient documentation

## 2017-02-26 DIAGNOSIS — I10 Essential (primary) hypertension: Secondary | ICD-10-CM | POA: Insufficient documentation

## 2017-02-26 DIAGNOSIS — G8929 Other chronic pain: Secondary | ICD-10-CM | POA: Insufficient documentation

## 2017-02-26 MED ORDER — AMLODIPINE BESYLATE 5 MG PO TABS: 5.0000 mg | ORAL_TABLET | Freq: Every day | ORAL | 3 refills | Status: DC

## 2017-02-26 MED ORDER — MELOXICAM 15 MG PO TABS: 15.0000 mg | ORAL_TABLET | Freq: Every day | ORAL | 3 refills | Status: DC

## 2017-02-26 MED ORDER — ACETAMINOPHEN-CODEINE #3 300-30 MG PO TABS: 1.0000 | ORAL_TABLET | Freq: Three times a day (TID) | ORAL | 0 refills | Status: DC | PRN

## 2017-02-26 NOTE — Progress Notes (Signed)
Patient ID: Thomas Ortiz, male   DOB: [birth year], 61 y.o.   MRN: [id number]     Thomas Ortiz, is a 61 y.o. male  [id number]:[id number]  [id number]:[id number]  DOB - 10/04/1955  Subjective:  Chief Complaint and HPI: Thomas Ortiz is a 61 y.o. male here today Not taking BP meds bc he felt as though the dose was too high and made him feel weak and dizzy.  BP ~140/90 at home. He does have a cuff at home.  No CP/SOB/HA.  No dizziness when not taking  amlodipine.    B foot pain since accident 2011 fell from roof and broke both ankles and had surgeries.  Has chronic pain from this.  He has taken Indocin for this is the past but has to take it very frequently. Also takes tylenol #3 occasionally for this.  Thomas Ortiz with [place] interpreters translating.   ROS:   Constitutional:  No f/c, No night sweats, No unexplained weight loss. EENT:  No vision changes, No blurry vision, No hearing changes. No mouth, throat, or ear problems.  Respiratory: No cough, No SOB Cardiac: No CP, no palpitations GI:  No abd pain, No N/V/D. GU: No Urinary s/sx Musculoskeletal: +B foot pain Neuro: No headache, no dizziness, no motor weakness.  Skin: No rash Endocrine:  No polydipsia. No polyuria.  Psych: Denies SI/HI  No problems updated.  ALLERGIES: No Known Allergies  PAST MEDICAL HISTORY: Past Medical History:  Diagnosis Date  . Hypertension 2007    MEDICATIONS AT HOME: Prior to Admission medications   Medication Sig Start Date End Date Taking? Authorizing Provider  acetaminophen-codeine (TYLENOL #3) 300-30 MG tablet Take 1 tablet by mouth every 8 (eight) hours as needed for moderate pain. 02/26/17   Anders Simmonds, PA-C  amLODipine (NORVASC) 5 MG tablet Take 1 tablet (5 mg total) by mouth daily. 02/26/17   Anders Simmonds, PA-C  meloxicam (MOBIC) 15 MG tablet Take 1 tablet (15 mg total) by mouth daily. With food 02/26/17   Anders Simmonds, PA-C     Objective:  EXAM:   Vitals:   02/26/17 1612  BP:  136/83  Pulse: 63  Resp: 18  Temp: 98.2 F (36.8 C)  TempSrc: Oral  SpO2: 97%  Weight: 145 lb (65.8 kg)  Height:  (1.6 m)    General appearance : A&OX3. NAD. Non-toxic-appearing HEENT: Atraumatic and Normocephalic.  PERRLA. EOM intact.   Neck: supple, no JVD. No cervical lymphadenopathy. No thyromegaly Chest/Lungs:  Breathing-non-labored, Good air entry bilaterally, breath sounds normal without rales, rhonchi, or wheezing  CVS: S1 S2 regular, no murmurs, gallops, rubs  Extremities: Bilateral Lower Ext shows no edema, both legs are warm to touch with = pulse throughout.  Extensive scarring that is healed at the lower leg/ankle.  Decreased ROM.   Neurology:  CN II-XII grossly intact, Non focal.   Psych:  TP linear. J/I WNL. Normal speech. Appropriate eye contact and affect.  Skin:  No Rash  Data Review Lab Results  Component Value Date   HGBA1C 5.70 07/05/2015     Assessment & Plan   1. Essential hypertension Not at goal.  - Comprehensive metabolic panel - CBC with Differential/Platelet Will resume, but at 1/2 dose due to SE of dizziness- amLODipine (NORVASC) 5 MG tablet; Take 1 tablet (5 mg total) by mouth daily.  Dispense: 90 tablet; Refill: 3  2. Bilateral foot pain-chronic - acetaminophen-codeine (TYLENOL #3) 300-30 MG tablet; Take 1 tablet by mouth every 8 (eight) hours as  needed for moderate pain.  Dispense: 21 tablet; Refill: 0-use sparingly; chemical dependency properties discussed.  Due to chronicity of pain, I would like to find something other than Indocin that might work for his pain-will try - meloxicam (MOBIC) 15 MG tablet; Take 1 tablet (15 mg total) by mouth daily. With food  Dispense: 30 tablet; Refill: 3   Patient have been counseled extensively about nutrition and exercise  Return in about 6 weeks (around 04/09/2017) for assign new PCP and f/up htn and chronic ankle pain.  The patient was given clear instructions to go to ER or return to medical  center if symptoms don't improve, worsen or new problems develop. The patient verbalized understanding. The patient was told to call to get lab results if they haven't heard anything in the next week.     Georgian CoAngela Alyzza Andringa, PA-C [facility] [place], [place] [phone]   02/26/2017, 4:25 PM

## 2017-02-27 LAB — COMPREHENSIVE METABOLIC PANEL
A/G RATIO: 1.8 (ref 1.2–2.2)
ALK PHOS: 103 IU/L (ref 39–117)
ALT: 8 IU/L (ref 0–44)
AST: 13 IU/L (ref 0–40)
Albumin: 4.5 g/dL (ref 3.6–4.8)
BILIRUBIN TOTAL: 0.3 mg/dL (ref 0.0–1.2)
BUN / CREAT RATIO: 15 (ref 10–24)
BUN: 15 mg/dL (ref 8–27)
CHLORIDE: 105 mmol/L (ref 96–106)
CO2: 21 mmol/L (ref 20–29)
Calcium: 9 mg/dL (ref 8.6–10.2)
Creatinine, Ser: 0.99 mg/dL (ref 0.76–1.27)
GFR calc non Af Amer: 81 mL/min/{1.73_m2} (ref >59)
GFR, EST AFRICAN AMERICAN: 94 mL/min/{1.73_m2} (ref >59)
Globulin, Total: 2.5 g/dL (ref 1.5–4.5)
Glucose: 96 mg/dL (ref 65–99)
POTASSIUM: 4.2 mmol/L (ref 3.5–5.2)
Sodium: 143 mmol/L (ref 134–144)
TOTAL PROTEIN: 7 g/dL (ref 6.0–8.5)

## 2017-02-27 LAB — CBC WITH DIFFERENTIAL/PLATELET
BASOS ABS: 0 10*3/uL (ref 0.0–0.2)
Basos: 0 %
EOS (ABSOLUTE): 0.2 10*3/uL (ref 0.0–0.4)
Eos: 3 %
Hematocrit: 39.8 % (ref 37.5–51.0)
Hemoglobin: 13 g/dL (ref 13.0–17.7)
Immature Grans (Abs): 0 10*3/uL (ref 0.0–0.1)
Immature Granulocytes: 0 %
LYMPHS ABS: 2.2 10*3/uL (ref 0.7–3.1)
LYMPHS: 33 %
MCH: 30.7 pg (ref 26.6–33.0)
MCHC: 32.7 g/dL (ref 31.5–35.7)
MCV: 94 fL (ref 79–97)
Monocytes Absolute: 0.5 10*3/uL (ref 0.1–0.9)
Monocytes: 7 %
NEUTROS ABS: 3.8 10*3/uL (ref 1.4–7.0)
Neutrophils: 57 %
PLATELETS: 326 10*3/uL (ref 150–379)
RBC: 4.24 x10E6/uL (ref 4.14–5.80)
RDW: 14.2 % (ref 12.3–15.4)
WBC: 6.7 10*3/uL (ref 3.4–10.8)

## 2017-02-27 MED FILL — AMLODIPINE BESYLATE 5 MG TA: 5 | 30 days supply | Qty: 30 | Fill #0

## 2017-02-27 MED FILL — ?MELOXICAM 15MG TABLET: 15 | 30 days supply | Qty: 30 | Fill #0

## 2017-03-02 MED FILL — ACETAMINOPHEN/COD #3 TABLET: 300-30 | 7 days supply | Qty: 21 | Fill #0

## 2017-03-03 ENCOUNTER — Telehealth: Payer: Self-pay | Admitting: *Deleted

## 2017-03-03 NOTE — Telephone Encounter (Signed)
Medical Assistant used [employer] to contact patient.  Interpreter Name: Jennette Kettle Interpreter #: [phone] MA unable to leave a voice message due to system not being set up. !!!Please inform patient of labs being normal and to follow up as planned!!!

## 2017-03-03 NOTE — Telephone Encounter (Signed)
-----   Message from Anders Simmonds, [place] sent at 03/01/2017  5:04 PM EDT ----- Please call patient.  Labs are normal.  Follow-up as planned. Thanks, Georgian Co, PA-C

## 2017-04-21 ENCOUNTER — Other Ambulatory Visit: Payer: Self-pay | Admitting: Physician Assistant

## 2017-04-21 ENCOUNTER — Telehealth: Payer: Self-pay

## 2017-04-21 DIAGNOSIS — M79672 Pain in left foot: Principal | ICD-10-CM

## 2017-04-21 DIAGNOSIS — M79671 Pain in right foot: Secondary | ICD-10-CM

## 2017-04-21 MED FILL — MELOXICAM 15 MG TABLET: 15 | 30 days supply | Qty: 30 | Fill #1

## 2017-04-21 MED FILL — AMLODIPINE BESYLATE 5 MG TA: 5 | 30 days supply | Qty: 30 | Fill #1

## 2017-04-21 NOTE — Telephone Encounter (Signed)
Pt. Called requesting a refill on Tylenol # 3. Pt. Has scheduled an appt. To establish care on 05/11/17. [facility] f/u

## 2017-05-04 ENCOUNTER — Other Ambulatory Visit: Payer: Self-pay | Admitting: Internal Medicine

## 2017-05-04 DIAGNOSIS — M79672 Pain in left foot: Principal | ICD-10-CM

## 2017-05-04 DIAGNOSIS — M79671 Pain in right foot: Secondary | ICD-10-CM

## 2017-05-04 MED ORDER — ACETAMINOPHEN-CODEINE #3 300-30 MG PO TABS: 1.0000 | ORAL_TABLET | Freq: Three times a day (TID) | ORAL | 0 refills | Status: DC | PRN

## 2017-05-04 NOTE — Telephone Encounter (Signed)
Refilled Electronically  

## 2017-05-11 ENCOUNTER — Ambulatory Visit: Payer: Self-pay | Admitting: Nurse Practitioner

## 2017-07-28 MED FILL — ?AMLODIPINE BESYLATE 5 MG T: 5 MG | 30 days supply | Qty: 30 | Fill #2

## 2017-07-28 MED FILL — MELOXICAM 15 MG TABLET: 15 | 30 days supply | Qty: 30 | Fill #2

## 2017-09-02 ENCOUNTER — Ambulatory Visit: Payer: Self-pay | Attending: Family Medicine | Admitting: Physician Assistant

## 2017-09-02 ENCOUNTER — Other Ambulatory Visit: Payer: Self-pay

## 2017-09-02 VITALS — BP 142/89 | HR 67 | Temp 98.2°F | Resp 16 | Wt 142.4 lb

## 2017-09-02 DIAGNOSIS — I10 Essential (primary) hypertension: Secondary | ICD-10-CM | POA: Insufficient documentation

## 2017-09-02 DIAGNOSIS — R079 Chest pain, unspecified: Secondary | ICD-10-CM | POA: Insufficient documentation

## 2017-09-02 DIAGNOSIS — Z79899 Other long term (current) drug therapy: Secondary | ICD-10-CM | POA: Insufficient documentation

## 2017-09-02 DIAGNOSIS — R42 Dizziness and giddiness: Secondary | ICD-10-CM | POA: Insufficient documentation

## 2017-09-02 DIAGNOSIS — R001 Bradycardia, unspecified: Secondary | ICD-10-CM | POA: Insufficient documentation

## 2017-09-02 DIAGNOSIS — R269 Unspecified abnormalities of gait and mobility: Secondary | ICD-10-CM | POA: Insufficient documentation

## 2017-09-02 NOTE — Progress Notes (Signed)
Patient ID: Thomas Ortiz, male   DOB: Oct 30, 1955, 62 y.o.   MRN: [id number]     Thomas Ortiz, is a 62 y.o. male  [id number]:[id number]  [id number]  DOB - 09/01/1955  Subjective:  Chief Complaint and HPI: Thomas Ortiz is a 62 y.o. male here today for Dizzy often esp when he stands up.  Sometimes getting off balance.  This has been going on for about 4 months.  Appetite is good but he has lost 15 pounds over the last year.  No N/V/D or change in stools.  He feels daily activities take him longer than before.  He is having to hold on to things sometimes when he is trying to get around.  No HA.  Occasional L sided CP.  No SOB.  No interpreter needed.  Patient given by history.    ROS:   Constitutional:  No f/c, No night sweats, No unexplained weight loss. EENT:  No vision changes, No blurry vision, No hearing changes. No mouth, throat, or ear problems.  Respiratory: No cough, No SOB Cardiac: No CP, no palpitations GI:  No abd pain, No N/V/D. GU: No Urinary s/sx Musculoskeletal: No joint pain Neuro: No headache, no dizziness, no motor weakness.  Skin: No rash Endocrine:  No polydipsia. No polyuria.  Psych: Denies SI/HI  No problems updated.  ALLERGIES: No Known Allergies  PAST MEDICAL HISTORY: Past Medical History:  Diagnosis Date  . Hypertension 2007    MEDICATIONS AT HOME: Prior to Admission medications   Medication Sig Start Date End Date Taking? Authorizing Provider  amLODipine (NORVASC) 5 MG tablet Take 1 tablet (5 mg total) by mouth daily. 02/26/17  Yes Sabian Kuba, Marzella Schlein, PA-C  meloxicam (MOBIC) 15 MG tablet Take 1 tablet (15 mg total) by mouth daily. With food 02/26/17  Yes Ary Lavine, Marzella Schlein, PA-C     Objective:  EXAM:   Vitals:   09/02/17 1329  BP: (!) 142/89  Pulse: 67  Resp: 16  Temp: 98.2 F (36.8 C)  TempSrc: Oral  SpO2: 97%  Weight: 142 lb 6.4 oz (64.6 kg)    General appearance : A&OX3. NAD. Non-toxic-appearing HEENT: Atraumatic and Normocephalic.   PERRLA. EOM intact. Fundi benign. No nystagmus. TM clear B. Mouth-MMM, post pharynx WNL w/o erythema, No PND. Neck: supple, no JVD. No cervical lymphadenopathy. No thyromegaly Chest/Lungs:  Breathing-non-labored, Good air entry bilaterally, breath sounds normal without rales, rhonchi, or wheezing  CVS: S1 S2 regular, no murmurs, gallops, rubs  Abdomen: Bowel sounds present, Non tender and not distended with no gaurding, rigidity or rebound. Extremities: Bilateral Lower Ext shows no edema, both legs are warm to touch with = pulse throughout Neurology:  CN II-XII grossly intact, Non focal.  Gait is slower than expected.  He is able to perform heel to toe walking but slower than expected.   Psych:  TP linear. J/I WNL. Normal speech. Appropriate eye contact and affect.  Skin:  No Rash  Data Review Lab Results  Component Value Date   HGBA1C 5.70 07/05/2015     Assessment & Plan   1. Dizziness Call 911 if any symptoms worsen. No red flags in office.  Hydration imperative.  Drink about 80 ounces of water daily  Ambulatory referral to Neurology - Comprehensive metabolic panel - CBC with Differential/Platelet - TSH  2. Gait disturbance - Ambulatory referral to Neurology  3. Chest pain, unspecified type Sinus bradycardia on EKG.  No ST changes.   - EKG 12-Lead  4. Sinus bradycardia-rate 56 on  EKG today, 70 on last EKG - Ambulatory referral to Cardiology   Patient have been counseled extensively about nutrition and exercise  Return in about 4 weeks (around 09/30/2017) for Dr Alvis LemmingsNewlin for f/up htn and dizziness.  The patient was given clear instructions to go to ER or return to medical center if symptoms don't improve, worsen or new problems develop. The patient verbalized understanding. The patient was told to call to get lab results if they haven't heard anything in the next week.     Georgian CoAngela Daytona Hedman, PA-C [facility] [place], [place] [phone]    09/02/2017, 2:41 PM

## 2017-09-02 NOTE — Patient Instructions (Signed)

## 2017-09-03 ENCOUNTER — Telehealth: Payer: Self-pay

## 2017-09-03 LAB — COMPREHENSIVE METABOLIC PANEL
ALBUMIN: 4.3 g/dL (ref 3.6–4.8)
ALT: 8 IU/L (ref 0–44)
AST: 19 IU/L (ref 0–40)
Albumin/Globulin Ratio: 1.6 (ref 1.2–2.2)
Alkaline Phosphatase: 106 IU/L (ref 39–117)
BUN/Creatinine Ratio: 14 (ref 10–24)
BUN: 15 mg/dL (ref 8–27)
Bilirubin Total: 0.4 mg/dL (ref 0.0–1.2)
CALCIUM: 9.5 mg/dL (ref 8.6–10.2)
CO2: 23 mmol/L (ref 20–29)
CREATININE: 1.05 mg/dL (ref 0.76–1.27)
Chloride: 106 mmol/L (ref 96–106)
GFR calc Af Amer: 87 mL/min/{1.73_m2} (ref >59)
GFR, EST NON AFRICAN AMERICAN: 75 mL/min/{1.73_m2} (ref >59)
GLOBULIN, TOTAL: 2.7 g/dL (ref 1.5–4.5)
GLUCOSE: 84 mg/dL (ref 65–99)
Potassium: 4.6 mmol/L (ref 3.5–5.2)
SODIUM: 142 mmol/L (ref 134–144)
Total Protein: 7 g/dL (ref 6.0–8.5)

## 2017-09-03 LAB — TSH: TSH: 1.35 u[IU]/mL (ref 0.450–4.500)

## 2017-09-03 LAB — CBC WITH DIFFERENTIAL/PLATELET
BASOS: 0 %
Basophils Absolute: 0 10*3/uL (ref 0.0–0.2)
EOS (ABSOLUTE): 0.1 10*3/uL (ref 0.0–0.4)
EOS: 2 %
HEMATOCRIT: 39.3 % (ref 37.5–51.0)
Hemoglobin: 12.3 g/dL — ABNORMAL LOW (ref 13.0–17.7)
IMMATURE GRANS (ABS): 0 10*3/uL (ref 0.0–0.1)
IMMATURE GRANULOCYTES: 0 %
LYMPHS: 27 %
Lymphocytes Absolute: 1.9 10*3/uL (ref 0.7–3.1)
MCH: 30.8 pg (ref 26.6–33.0)
MCHC: 31.3 g/dL — ABNORMAL LOW (ref 31.5–35.7)
MCV: 98 fL — AB (ref 79–97)
MONOCYTES: 8 %
Monocytes Absolute: 0.5 10*3/uL (ref 0.1–0.9)
NEUTROS PCT: 63 %
Neutrophils Absolute: 4.4 10*3/uL (ref 1.4–7.0)
PLATELETS: 333 10*3/uL (ref 150–379)
RBC: 4 x10E6/uL — ABNORMAL LOW (ref 4.14–5.80)
RDW: 14.6 % (ref 12.3–15.4)
WBC: 7.1 10*3/uL (ref 3.4–10.8)

## 2017-09-03 NOTE — Telephone Encounter (Signed)
CMA call patient regarding lab results   Patient did not answer but CMA left a VM stating the reason of the call & to call back  

## 2017-09-03 NOTE — Telephone Encounter (Signed)
-----   Message from Anders SimmondsAngela M McClung, [place] sent at 09/03/2017  8:42 AM EDT ----- Please call patient.  Labs are good and do not show the reasons for dizziness and being off balance.  Go to the ED/call 911 if s/sx worsen.  See specialists as planned.  Thanks, Georgian CoAngela McClung, PA-C

## 2017-09-04 NOTE — Telephone Encounter (Signed)
-----   Message from Angela M McClung, PA-C sent at 09/03/2017  8:42 AM EDT ----- Please call patient.  Labs are good and do not show the reasons for dizziness and being off balance.  Go to the ED/call 911 if s/sx worsen.  See specialists as planned.  Thanks, Angela McClung, PA-C 

## 2017-09-04 NOTE — Telephone Encounter (Signed)
CMA call second attempt regarding lab results   Patient did not answer but left a VM stating to call back

## 2017-09-18 MED FILL — MELOXICAM 15 MG TABLET: 15 | 30 days supply | Qty: 30 | Fill #3

## 2017-09-18 MED FILL — AMLODIPINE BESYLATE 5 MG TA: 5 | 30 days supply | Qty: 30 | Fill #3

## 2017-10-05 ENCOUNTER — Ambulatory Visit: Payer: Self-pay

## 2017-10-12 ENCOUNTER — Encounter: Payer: Self-pay | Admitting: Family Medicine

## 2017-10-12 ENCOUNTER — Ambulatory Visit: Payer: Self-pay | Attending: Family Medicine | Admitting: Family Medicine

## 2017-10-12 VITALS — BP 131/76 | HR 57 | Temp 97.9°F | Ht 63.0 in | Wt 143.4 lb

## 2017-10-12 DIAGNOSIS — Z9049 Acquired absence of other specified parts of digestive tract: Secondary | ICD-10-CM | POA: Insufficient documentation

## 2017-10-12 DIAGNOSIS — Z6825 Body mass index (BMI) 25.0-25.9, adult: Secondary | ICD-10-CM | POA: Insufficient documentation

## 2017-10-12 DIAGNOSIS — Z9889 Other specified postprocedural states: Secondary | ICD-10-CM | POA: Insufficient documentation

## 2017-10-12 DIAGNOSIS — M79672 Pain in left foot: Secondary | ICD-10-CM | POA: Insufficient documentation

## 2017-10-12 DIAGNOSIS — M179 Osteoarthritis of knee, unspecified: Secondary | ICD-10-CM | POA: Insufficient documentation

## 2017-10-12 DIAGNOSIS — R634 Abnormal weight loss: Secondary | ICD-10-CM | POA: Insufficient documentation

## 2017-10-12 DIAGNOSIS — R42 Dizziness and giddiness: Secondary | ICD-10-CM | POA: Insufficient documentation

## 2017-10-12 DIAGNOSIS — I10 Essential (primary) hypertension: Secondary | ICD-10-CM | POA: Insufficient documentation

## 2017-10-12 DIAGNOSIS — M1710 Unilateral primary osteoarthritis, unspecified knee: Secondary | ICD-10-CM | POA: Insufficient documentation

## 2017-10-12 DIAGNOSIS — M170 Bilateral primary osteoarthritis of knee: Secondary | ICD-10-CM | POA: Insufficient documentation

## 2017-10-12 DIAGNOSIS — M79671 Pain in right foot: Secondary | ICD-10-CM | POA: Insufficient documentation

## 2017-10-12 MED ORDER — MECLIZINE HCL 25 MG PO TABS: 25.0000 mg | ORAL_TABLET | Freq: Two times a day (BID) | ORAL | 3 refills | Status: DC | PRN

## 2017-10-12 MED ORDER — AMLODIPINE BESYLATE 5 MG PO TABS: 5.0000 mg | ORAL_TABLET | Freq: Every day | ORAL | 6 refills | Status: DC

## 2017-10-12 MED ORDER — MELOXICAM 15 MG PO TABS: 15.0000 mg | ORAL_TABLET | Freq: Every day | ORAL | 3 refills | Status: DC

## 2017-10-12 MED FILL — MECLIZINE 25 MG TABLET: 25 | 30 days supply | Qty: 60 | Fill #0

## 2017-10-12 NOTE — Progress Notes (Signed)
Subjective:  Patient ID: Thomas Ortiz, male    DOB: 10/31/1955  Age: 62 y.o. MRN: [id number]  CC: Hypertension   HPI Thomas Ortiz is a 62 year old male with a history of hypertension, chronic knee and feet pain ever since an accident at work in 2011 where he fell off the roof. He currently takes meloxicam which helps with his symptoms but ambulation causes pain in his knees and ankles. He has been compliant with his antihypertensive and denies adverse effects. He was seen by the PA at his last visit for dizziness and labs were unremarkable.  He continues to explain dizziness with turning of his head and change in position with a sensation of the room spinning. He had also been referred by the PA to cardiology due to complaints of chest pains in the setting of absent ST changes on EKG and he informs me his appointment comes up in 2 weeks. He is concerned that ever since his gallbladder surgery he has continued to lose weight despite a good appetite.  His thyroid labs are normal colonoscopy from 08/2015 revealed internal hemorrhoids and diverticulosis. Of note he has lost 16 pounds in the last 1 year. He underwent cataract surgery 8 months ago but continues to experience some blurry and cloudy vision and has been advised to be getting touch with his ophthalmologist.  Past Medical History:  Diagnosis Date  . Hypertension 2007    Past Surgical History:  Procedure Laterality Date  . bilateral foot sx  2011  . CHOLECYSTECTOMY N/A 01/30/2015   Procedure: LAPAROSCOPIC CHOLECYSTECTOMY WITH INTRAOPERATIVE CHOLANGIOGRAM;  Surgeon: Manus Rudd, MD;  Location: [facility] OR;  Service: General;  Laterality: N/A;  . COLONOSCOPY N/A 08/17/2015   Procedure: COLONOSCOPY;  Surgeon: West Bali, MD;  Location: [facility];  Service: Endoscopy;  Laterality: N/A;  1:45 PM    No Known Allergies   Outpatient Medications Prior to Visit  Medication Sig Dispense Refill  . amLODipine (NORVASC) 5 MG tablet Take 1  tablet (5 mg total) by mouth daily. 90 tablet 3  . meloxicam (MOBIC) 15 MG tablet Take 1 tablet (15 mg total) by mouth daily. With food 30 tablet 3   No facility-administered medications prior to visit.     ROS Review of Systems  Constitutional: Positive for unexpected weight change. Negative for activity change and appetite change.  HENT: Negative for sinus pressure and sore throat.   Eyes: Positive for visual disturbance.  Respiratory: Negative for cough, chest tightness and shortness of breath.   Cardiovascular: Negative for chest pain and leg swelling.  Gastrointestinal: Negative for abdominal distention, abdominal pain, constipation and diarrhea.  Endocrine: Negative.   Genitourinary: Negative for dysuria.  Musculoskeletal:       See hpi  Skin: Negative for rash.  Allergic/Immunologic: Negative.   Neurological: Positive for dizziness. Negative for weakness, light-headedness and numbness.  Psychiatric/Behavioral: Negative for dysphoric mood and suicidal ideas.    Objective:  BP 131/76   Pulse (!) 57   Temp 97.9 F (36.6 C) (Oral)   Ht  (1.6 m)   Wt 143 lb 6.4 oz (65 kg)   SpO2 100%   BMI 25.40 kg/m   BP/Weight 10/12/2017 09/02/2017 02/26/2017  Systolic BP 131 142 136  Diastolic BP 76 89 83  Wt. (Lbs) 143.4 142.4 145  BMI 25.4 25.23 25.69      Physical Exam  Constitutional: He is oriented to person, place, and time. He appears well-developed and well-nourished.  Cardiovascular: Normal rate,  normal heart sounds and intact distal pulses.  No murmur heard. Pulmonary/Chest: Effort normal and breath sounds normal. He has no wheezes. He has no rales. He exhibits no tenderness.  Abdominal: Soft. Bowel sounds are normal. He exhibits no distension and no mass. There is no tenderness.  Musculoskeletal: Normal range of motion.  Healed bilateral surgical scars and ankles Tenderness and crepitus on range of motion of both knees  Neurological: He is alert and oriented to  person, place, and time.  Skin: Skin is warm and dry.     Assessment & Plan:   1. Essential hypertension Controlled - amLODipine (NORVASC) 5 MG tablet; Take 1 tablet (5 mg total) by mouth daily.  Dispense: 30 tablet; Refill: 6  2. Bilateral foot pain Secondary to previous fractures - meloxicam (MOBIC) 15 MG tablet; Take 1 tablet (15 mg total) by mouth daily. With food  Dispense: 30 tablet; Refill: 3  3. Primary osteoarthritis of both knees Secondary to previous fractures Doing well on meloxicam  4. Vertigo Discussed sedating side effects of meclizine - meclizine (ANTIVERT) 25 MG tablet; Take 1 tablet (25 mg total) by mouth 2 (two) times daily as needed for dizziness.  Dispense: 60 tablet; Refill: 3  5.  Weight loss Unknown etiology Labs unrevealing Advised to increase oral intake.  Meds ordered this encounter  Medications  . amLODipine (NORVASC) 5 MG tablet    Sig: Take 1 tablet (5 mg total) by mouth daily.    Dispense:  30 tablet    Refill:  6  . meloxicam (MOBIC) 15 MG tablet    Sig: Take 1 tablet (15 mg total) by mouth daily. With food    Dispense:  30 tablet    Refill:  3  . meclizine (ANTIVERT) 25 MG tablet    Sig: Take 1 tablet (25 mg total) by mouth 2 (two) times daily as needed for dizziness.    Dispense:  60 tablet    Refill:  3    Follow-up: Return in about 3 months (around 01/11/2018) for follow up of Hypertension.   Hoy Register MD

## 2017-10-30 ENCOUNTER — Encounter: Payer: Self-pay | Admitting: Interventional Cardiology

## 2017-11-11 MED FILL — MELOXICAM 15 MG TABLET: 15 | 30 days supply | Qty: 30 | Fill #0

## 2017-11-20 ENCOUNTER — Encounter (INDEPENDENT_AMBULATORY_CARE_PROVIDER_SITE_OTHER): Payer: Self-pay

## 2017-11-20 ENCOUNTER — Encounter: Payer: Self-pay | Admitting: Interventional Cardiology

## 2017-11-20 ENCOUNTER — Ambulatory Visit (INDEPENDENT_AMBULATORY_CARE_PROVIDER_SITE_OTHER): Payer: No Typology Code available for payment source | Admitting: Interventional Cardiology

## 2017-11-20 VITALS — BP 136/80 | HR 61 | Ht 63.0 in | Wt 142.0 lb

## 2017-11-20 DIAGNOSIS — R001 Bradycardia, unspecified: Secondary | ICD-10-CM

## 2017-11-20 DIAGNOSIS — I10 Essential (primary) hypertension: Secondary | ICD-10-CM

## 2017-11-20 DIAGNOSIS — R079 Chest pain, unspecified: Secondary | ICD-10-CM

## 2017-11-20 NOTE — Progress Notes (Signed)
Cardiology Office Note    Date:  11/20/2017   ID:  Thomas QualeJose Omdahl, DOB 17-May-1956, MRN [id number]  PCP:  Hoy RegisterNewlin, Enobong, MD  Cardiologist: Lesleigh NoeHenry W Smith III, MD   Chief Complaint  Patient presents with  . Chest Pain    History of Present Illness:  Thomas Ortiz is a 62 y.o. male [facility] [place] evaluation of bradycardia.  He complains of squeezing chest discomfort over the last 6 to 7 months seemingly precipitated by activity  He is referred by Georgian CoAngela Mcclung, NP.  He has no prior history of heart disease.  An office note from March 2019 stated "bradycardia, cardiology evaluation needed".  In speaking with the patient he states that the chest discomfort with exertion and heavy activity is his problem.  He had no recollection of slow heart rate being mentioned.  This complaint has been off and on for the past 6 to 9 months.  The discomfort causes mild dyspnea, does not radiate, and resolves with rest after 3 to 5 minutes.  He does not have spontaneous episodes of discomfort.  Past Medical History:  Diagnosis Date  . Hypertension 2007    Past Surgical History:  Procedure Laterality Date  . bilateral foot sx  2011  . CHOLECYSTECTOMY N/A 01/30/2015   Procedure: LAPAROSCOPIC CHOLECYSTECTOMY WITH INTRAOPERATIVE CHOLANGIOGRAM;  Surgeon: Manus RuddMatthew Tsuei, MD;  [facility];  Service: General;  Laterality: N/A;  . COLONOSCOPY N/A 08/17/2015   Procedure: COLONOSCOPY;  Surgeon: West BaliSandi L Fields, MD;  [facility];  Service: Endoscopy;  Laterality: N/A;  1:45 PM    Current Medications: Outpatient Medications Prior to Visit  Medication Sig Dispense Refill  . amLODipine (NORVASC) 5 MG tablet Take 1 tablet (5 mg total) by mouth daily. 30 tablet 6  . meclizine (ANTIVERT) 25 MG tablet Take 1 tablet (25 mg total) by mouth 2 (two) times daily as needed for dizziness. 60 tablet 3  . meloxicam (MOBIC) 15 MG tablet Take  1 tablet (15 mg total) by mouth daily. With food 30 tablet 3   No facility-administered medications prior to visit.      Allergies:   Patient has no known allergies.   Social History   Socioeconomic History  . Marital status: Married    Spouse name: Not on file  . Number of children: Not on file  . Years of education: Not on file  . Highest education level: Not on file  Occupational History  . Not on file  Social Needs  . Financial resource strain: Not on file  . Food insecurity:    Worry: Not on file    Inability: Not on file  . Transportation needs:    Medical: Not on file    Non-medical: Not on file  Tobacco Use  . Smoking status: Never Smoker  . Smokeless tobacco: Never Used  Substance and Sexual Activity  . Alcohol use: No    Alcohol/week: 1.2 oz    Types: 2 Cans of beer per week    Comment: occasionally  . Drug use: No  . Sexual activity: Not on file  Lifestyle  . Physical activity:    Days per week: Not on file    Minutes per session: Not on file  . Stress: Not on file  Relationships  . Social connections:    Talks on phone: Not on file    Gets together: Not on file    Attends  religious service: Not on file    Active member of club or organization: Not on file    Attends meetings of clubs or organizations: Not on file    Relationship status: Not on file  Other Topics Concern  . Not on file  Social History Narrative  . Not on file     Family History:  The patient's family history is not on file.  He denies a family history of vascular disease.  ROS:   Please see the history of present illness.    Somewhat limited by language barrier.  Previous fracture of both lower extremities when he fell from a scaffold at his prior job years ago.  Does not currently work.  Unexplained weight gain, some shortness of breath, excessive sweating, fatigue. All other systems reviewed and are negative.   PHYSICAL EXAM:   VS:  BP 136/80   Pulse 61   Ht 5\' 3"  (1.6 m)    Wt 142 lb (64.4 kg)   BMI 25.15 kg/m    GEN: Well nourished, well developed, in no acute distress  HEENT: normal  Neck: no JVD, carotid bruits, or masses Cardiac: RRR; no murmurs, rubs, or gallops,no edema  Respiratory:  clear to auscultation bilaterally, normal work of breathing GI: soft, nontender, nondistended, + BS MS: no deformity or atrophy  Skin: warm and dry, no rash Neuro:  Alert and Oriented x 3, Strength and sensation are intact Psych: euthymic mood, full affect  Wt Readings from Last 3 Encounters:  11/20/17 142 lb (64.4 kg)  10/12/17 143 lb 6.4 oz (65 kg)  09/02/17 142 lb 6.4 oz (64.6 kg)      Studies/Labs Reviewed:   EKG:  EKG performed in March 2019 demonstrating normal sinus rhythm and normal overall appearance.  Recent Labs: 09/02/2017: ALT 8; BUN 15; Creatinine, Ser 1.05; Hemoglobin 12.3; Platelets 333; Potassium 4.6; Sodium 142; TSH 1.350   Lipid Panel No results found for: CHOL, TRIG, HDL, CHOLHDL, VLDL, LDLCALC, LDLDIRECT  Additional studies/ records that were reviewed today include:  None    ASSESSMENT:    1. Chest pain, unspecified type   2. Bradycardia   3. Essential hypertension      PLAN:  In order of problems listed above:  1. Exertional chest pain, occurring sporadically over the past 6 to 9 months.  Quality of discomfort is squeezing.  History of hypertension and male.  Plan exercise treadmill test to rule out angina.  If stress test is abnormal we will need further evaluation and management to include aspirin, risk medication with reference to lipid panel, and possibly heart catheterization. 2. Mild bradycardia is present but no basis for evaluation unless chronotropic incompetence noted on stress test. 3. Target blood pressure 130/80.  The stress test will help rule out poorly controlled blood pressure.  Clinical follow-up after the exercise treadmill test depending upon findings.    Medication Adjustments/Labs and Tests  Ordered: Current medicines are reviewed at length with the patient today.  Concerns regarding medicines are outlined above.  Medication changes, Labs and Tests ordered today are listed in the Patient Instructions below. Patient Instructions  Medication Instructions:  Your physician recommends that you continue on your current medications as directed. Please refer to the Current Medication list given to you today.  Labwork: None  Testing/Procedures: Your physician has requested that you have an exercise tolerance test. For further information please visit [email or web address] Please also follow instruction sheet, as given.   Follow-Up: Your physician recommends that you schedule  a follow-up appointment as needed with Dr. Katrinka Blazing.    Any Other Special Instructions Will Be Listed Below (If Applicable).     If you need a refill on your cardiac medications before your next appointment, please call your pharmacy.      Signed, Lesleigh Noe, MD  11/20/2017 1:48 PM    [facility] [address] [place], [place], [place]  [address] Phone: [phone]; Fax: [phone]

## 2017-11-20 NOTE — Patient Instructions (Signed)
Medication Instructions:  Your physician recommends that you continue on your current medications as directed. Please refer to the Current Medication list given to you today.  Labwork: None  Testing/Procedures: Your physician has requested that you have an exercise tolerance test. For further information please visit [email or web address]. Please also follow instruction sheet, as given.   Follow-Up: Your physician recommends that you schedule a follow-up appointment as needed with Dr. Smith.    Any Other Special Instructions Will Be Listed Below (If Applicable).     If you need a refill on your cardiac medications before your next appointment, please call your pharmacy.   

## 2017-11-25 ENCOUNTER — Ambulatory Visit (INDEPENDENT_AMBULATORY_CARE_PROVIDER_SITE_OTHER): Payer: No Typology Code available for payment source

## 2017-11-25 DIAGNOSIS — R079 Chest pain, unspecified: Secondary | ICD-10-CM

## 2017-11-25 LAB — EXERCISE TOLERANCE TEST
CSEPED: 5 min
CSEPEW: 7 METS
CSEPHR: 66 %
CSEPPHR: 105 {beats}/min
Exercise duration (sec): 45 s
MPHR: 157 {beats}/min
RPE: 16
Rest HR: 53 {beats}/min

## 2017-11-30 ENCOUNTER — Telehealth: Payer: Self-pay | Admitting: *Deleted

## 2017-11-30 DIAGNOSIS — R079 Chest pain, unspecified: Secondary | ICD-10-CM

## 2017-11-30 NOTE — Telephone Encounter (Signed)
Informed pt of results. Pt verbalized understanding. Order placed for calcium score.

## 2017-11-30 NOTE — Telephone Encounter (Signed)
-----   Message from Lyn RecordsHenry W Smith, MD sent at 11/26/2017  1:26 PM EDT ----- Let the patient know stress test did not reveal significant abnormalities.  Please perform a coronary calcium score.  This will help to determine if there is plaque buildup and guide risk factor modification. A copy will be sent to Hoy RegisterNewlin, Enobong, MD

## 2017-12-10 ENCOUNTER — Inpatient Hospital Stay: Admission: RE | Admit: 2017-12-10 | Payer: No Typology Code available for payment source | Source: Ambulatory Visit

## 2017-12-24 ENCOUNTER — Ambulatory Visit (INDEPENDENT_AMBULATORY_CARE_PROVIDER_SITE_OTHER)
Admission: RE | Admit: 2017-12-24 | Discharge: 2017-12-24 | Disposition: A | Discharge status: Home / Self Care | Payer: Self-pay | Source: Ambulatory Visit | Attending: Interventional Cardiology | Admitting: Interventional Cardiology

## 2017-12-24 DIAGNOSIS — R079 Chest pain, unspecified: Secondary | ICD-10-CM

## 2017-12-29 MED FILL — MELOXICAM 15 MG TABLET: 15 | 30 days supply | Qty: 30 | Fill #1

## 2018-01-03 NOTE — Progress Notes (Signed)
Cardiology Office Note:    Date:  01/05/2018   ID:  Thomas Ortiz, DOB [birth year], MRN [id number]  PCP:  Hoy Register, MD  Cardiologist:  No primary care provider on file.   Referring MD: Hoy Register, MD   Chief Complaint  Patient presents with  . Coronary Artery Disease    History of Present Illness:    Thomas Ortiz is a 62 y.o. male with a hx of chest pain , elevated BP, and bradycardia.  He was seen recently for evaluation to exclude coronary disease as a cause of chest discomfort.  Chest discomfort is characterized as a tightness with heavy physical activity.  It goes away with rest.  A recent exercise treadmill test did not reveal evidence of ischemia.  A coronary calcium score was performed and was 247 which was 75th percentile for age-matched meals.  He is back in today to discuss findings.  He has not had any significant episode of chest discomfort since our first visit.  Past Medical History:  Diagnosis Date  . Hypertension 2007    Past Surgical History:  Procedure Laterality Date  . bilateral foot sx  2011  . CHOLECYSTECTOMY N/A 01/30/2015   Procedure: LAPAROSCOPIC CHOLECYSTECTOMY WITH INTRAOPERATIVE CHOLANGIOGRAM;  Surgeon: Manus Rudd, MD;  Location: [facility] OR;  Service: General;  Laterality: N/A;  . COLONOSCOPY N/A 08/17/2015   Procedure: COLONOSCOPY;  Surgeon: West Bali, MD;  [facility];  Service: Endoscopy;  Laterality: N/A;  1:45 PM    Current Medications: Current Meds  Medication Sig  . amLODipine (NORVASC) 5 MG tablet Take 1 tablet (5 mg total) by mouth daily.  . meclizine (ANTIVERT) 25 MG tablet Take 1 tablet (25 mg total) by mouth 2 (two) times daily as needed for dizziness.  . meloxicam (MOBIC) 15 MG tablet Take 1 tablet (15 mg total) by mouth daily. With food     Allergies:   Patient has no known allergies.   Social History   Socioeconomic History  . Marital status: Married    Spouse name: Not on file  . Number of children: Not  on file  . Years of education: Not on file  . Highest education level: Not on file  Occupational History  . Not on file  Social Needs  . Financial resource strain: Not on file  . Food insecurity:    Worry: Not on file    Inability: Not on file  . Transportation needs:    Medical: Not on file    Non-medical: Not on file  Tobacco Use  . Smoking status: Former Games developer  . Smokeless tobacco: Never Used  Substance and Sexual Activity  . Alcohol use: No    Alcohol/week: 1.2 oz    Types: 2 Cans of beer per week    Comment: occasionally  . Drug use: No  . Sexual activity: Not on file  Lifestyle  . Physical activity:    Days per week: Not on file    Minutes per session: Not on file  . Stress: Not on file  Relationships  . Social connections:    Talks on phone: Not on file    Gets together: Not on file    Attends religious service: Not on file    Active member of club or organization: Not on file    Attends meetings of clubs or organizations: Not on file    Relationship status: Not on file  Other Topics Concern  . Not on file  Social History Narrative  .  Not on file     Family History: The patient's family history is not on file.  ROS:   Please see the history of present illness.    Change in appetite, unexplained weight loss, vision disturbance, otherwise no complaints.  All other systems reviewed and are negative.  EKGs/Labs/Other Studies Reviewed:    The following studies were reviewed today:  Coronary Calcium Score 12/24/2017: IMPRESSION: Coronary calcium score of 242. This was 60 percentile for age and sex matched control.  Exercise treadmill test 11/25/2017: Study Highlights     Blood pressure demonstrated a normal response to exercise.  There was no ST segment deviation noted during stress.   No ischemic ECG changes but test was submaximal.      EKG:  EKG is not repeated/ordered today.    Recent Labs: 09/02/2017: ALT 8; BUN 15; Creatinine, Ser 1.05;  Hemoglobin 12.3; Platelets 333; Potassium 4.6; Sodium 142; TSH 1.350  Recent Lipid Panel No results found for: CHOL, TRIG, HDL, CHOLHDL, VLDL, LDLCALC, LDLDIRECT  Physical Exam:    VS:  BP 130/82   Pulse 60   Ht 5\' 2"  (1.575 m)   Wt 144 lb 3.2 oz (65.4 kg)   BMI 26.37 kg/m     Wt Readings from Last 3 Encounters:  01/05/18 144 lb 3.2 oz (65.4 kg)  11/20/17 142 lb (64.4 kg)  10/12/17 143 lb 6.4 oz (65 kg)     GEN:  Well nourished, well developed in no acute distress HEENT: Normal NECK: No JVD. LYMPHATICS: No lymphadenopathy CARDIAC: RRR, no murmur, no gallop, no edema. VASCULAR: 2+ and symmetric in the radial and posterior tibial pulses.  No bruits. RESPIRATORY:  Clear to auscultation without rales, wheezing or rhonchi  ABDOMEN: Soft, non-tender, non-distended, No pulsatile mass, MUSCULOSKELETAL: No deformity  SKIN: Warm and dry NEUROLOGIC:  Alert and oriented x 3 PSYCHIATRIC:  Normal affect   ASSESSMENT:    1. Angina pectoris (HCC)   2. Coronary artery calcification seen on CT scan   3. Essential hypertension   4. Bradycardia    PLAN:    In order of problems listed above:  1. Instructed to decrease/stop physical activity of chest discomfort.  Monitor frequency of recurrent episodes.  Notify if increasing frequency, duration of episodes, or severity of episodes.  Start aspirin 81 mg/day metoprolol succinate 25 mg/day.  4-week to 6-week follow-up. 2. LDL cholesterol will be checked today.  Rosuvastatin will be started dependent upon the LDL level.  Pete lipid panel will be done in 6 weeks on return.  Liver panel will be done at the same time. 3. Adequate blood pressure control at rest.  Metoprolol succinate is being added  Clinical follow-up in 4 to 6 weeks with blood work after initiation of statin, aspirin, and beta-blocker therapy.  We did briefly discussed the potential need for coronary angiography if symptoms are not controlled with medication.   Medication  Adjustments/Labs and Tests Ordered: Current medicines are reviewed at length with the patient today.  Concerns regarding medicines are outlined above.  Orders Placed This Encounter  Procedures  . Lipid Profile  . Basic metabolic panel  . Hepatic function panel   Meds ordered this encounter  Medications  . rosuvastatin (CRESTOR) 10 MG tablet    Sig: Take 1 tablet (10 mg total) by mouth daily.    Dispense:  90 tablet    Refill:  3  . aspirin EC 81 MG tablet    Sig: Take 1 tablet (81 mg total) by  mouth daily.    Dispense:  90 tablet    Refill:  3    Patient Instructions  Medication Instructions:  1) START Rosuvastatin 10mg  once daily 2) START Aspirin 81mg  once daily 3) START Metoprolol Succinate 25mg  once daily  Labwork: Lipid, Liver and BMET today  You will need to come fasting to your follow up appointment in 6 weeks so we can repeat Lipid and liver panel.  This means nothing to eat or drink after midnight the night before except water.   Testing/Procedures: None  Follow-Up: Your physician recommends that you schedule a follow-up appointment in: 6 weeks with Dr. Katrinka BlazingSmith. (Can have 9/9 at 11:40A or 9/12 at 10AM)   Any Other Special Instructions Will Be Listed Below (If Applicable).     If you need a refill on your cardiac medications before your next appointment, please call your pharmacy.      Signed, Lesleigh NoeHenry W Smith III, MD  01/05/2018 11:32 AM    [facility]

## 2018-01-05 ENCOUNTER — Encounter (INDEPENDENT_AMBULATORY_CARE_PROVIDER_SITE_OTHER): Payer: Self-pay

## 2018-01-05 ENCOUNTER — Encounter: Payer: Self-pay | Admitting: Interventional Cardiology

## 2018-01-05 ENCOUNTER — Ambulatory Visit (INDEPENDENT_AMBULATORY_CARE_PROVIDER_SITE_OTHER): Payer: No Typology Code available for payment source | Admitting: Interventional Cardiology

## 2018-01-05 VITALS — BP 130/82 | HR 60 | Ht 62.0 in | Wt 144.2 lb

## 2018-01-05 DIAGNOSIS — I209 Angina pectoris, unspecified: Secondary | ICD-10-CM

## 2018-01-05 DIAGNOSIS — I2510 Atherosclerotic heart disease of native coronary artery without angina pectoris: Secondary | ICD-10-CM

## 2018-01-05 DIAGNOSIS — R001 Bradycardia, unspecified: Secondary | ICD-10-CM

## 2018-01-05 DIAGNOSIS — I10 Essential (primary) hypertension: Secondary | ICD-10-CM

## 2018-01-05 LAB — HEPATIC FUNCTION PANEL
ALK PHOS: 97 IU/L (ref 39–117)
ALT: 9 IU/L (ref 0–44)
AST: 16 IU/L (ref 0–40)
Albumin: 4.4 g/dL (ref 3.6–4.8)
BILIRUBIN TOTAL: 0.5 mg/dL (ref 0.0–1.2)
Bilirubin, Direct: 0.13 mg/dL (ref 0.00–0.40)
TOTAL PROTEIN: 6.8 g/dL (ref 6.0–8.5)

## 2018-01-05 LAB — LIPID PANEL
Chol/HDL Ratio: 3.2 ratio (ref 0.0–5.0)
Cholesterol, Total: 228 mg/dL — ABNORMAL HIGH (ref 100–199)
HDL: 72 mg/dL (ref >39)
LDL Calculated: 129 mg/dL — ABNORMAL HIGH (ref 0–99)
Triglycerides: 135 mg/dL (ref 0–149)
VLDL CHOLESTEROL CAL: 27 mg/dL (ref 5–40)

## 2018-01-05 LAB — BASIC METABOLIC PANEL
BUN/Creatinine Ratio: 17 (ref 10–24)
BUN: 18 mg/dL (ref 8–27)
CO2: 23 mmol/L (ref 20–29)
Calcium: 9.2 mg/dL (ref 8.6–10.2)
Chloride: 102 mmol/L (ref 96–106)
Creatinine, Ser: 1.06 mg/dL (ref 0.76–1.27)
GFR calc Af Amer: 86 mL/min/{1.73_m2} (ref >59)
GFR calc non Af Amer: 74 mL/min/{1.73_m2} (ref >59)
Glucose: 97 mg/dL (ref 65–99)
Potassium: 4.7 mmol/L (ref 3.5–5.2)
SODIUM: 137 mmol/L (ref 134–144)

## 2018-01-05 MED ORDER — ASPIRIN EC 81 MG PO TBEC: 81.0000 mg | DELAYED_RELEASE_TABLET | Freq: Every day | ORAL | 3 refills | Status: DC

## 2018-01-05 MED ORDER — ROSUVASTATIN CALCIUM 10 MG PO TABS: 10.0000 mg | ORAL_TABLET | Freq: Every day | ORAL | 3 refills | Status: DC

## 2018-01-05 MED FILL — ROSUVASTATIN CALCIUM 10 MG: 10 | 30 days supply | Qty: 30 | Fill #0

## 2018-01-05 NOTE — Patient Instructions (Addendum)
Medication Instructions:  1) START Rosuvastatin 10mg  once daily 2) START Aspirin 81mg  once daily 3) START Metoprolol Succinate 25mg  once daily  Labwork: Lipid, Liver and BMET today  You will need to come fasting to your follow up appointment in 6 weeks so we can repeat Lipid and liver panel.  This means nothing to eat or drink after midnight the night before except water.   Testing/Procedures: None  Follow-Up: Your physician recommends that you schedule a follow-up appointment in: 6 weeks with Dr. Katrinka BlazingSmith. (Can have 9/9 at 11:40A or 9/12 at 10AM)   Any Other Special Instructions Will Be Listed Below (If Applicable).     If you need a refill on your cardiac medications before your next appointment, please call your pharmacy.

## 2018-01-08 ENCOUNTER — Telehealth: Payer: Self-pay | Admitting: Interventional Cardiology

## 2018-01-08 NOTE — Telephone Encounter (Signed)
Left message to call back  

## 2018-01-08 NOTE — Telephone Encounter (Signed)
New Message ° ° ° °Patient is returning call in reference to labs. Please call.  °

## 2018-01-11 ENCOUNTER — Encounter: Payer: Self-pay | Admitting: Family Medicine

## 2018-01-11 ENCOUNTER — Ambulatory Visit: Payer: No Typology Code available for payment source | Attending: Family Medicine | Admitting: Family Medicine

## 2018-01-11 VITALS — BP 130/77 | HR 58 | Temp 98.2°F | Ht 62.0 in | Wt 143.6 lb

## 2018-01-11 DIAGNOSIS — M79672 Pain in left foot: Secondary | ICD-10-CM

## 2018-01-11 DIAGNOSIS — Z79899 Other long term (current) drug therapy: Secondary | ICD-10-CM | POA: Insufficient documentation

## 2018-01-11 DIAGNOSIS — Z9889 Other specified postprocedural states: Secondary | ICD-10-CM | POA: Insufficient documentation

## 2018-01-11 DIAGNOSIS — M19042 Primary osteoarthritis, left hand: Secondary | ICD-10-CM

## 2018-01-11 DIAGNOSIS — R42 Dizziness and giddiness: Secondary | ICD-10-CM

## 2018-01-11 DIAGNOSIS — M19041 Primary osteoarthritis, right hand: Secondary | ICD-10-CM

## 2018-01-11 DIAGNOSIS — M79671 Pain in right foot: Secondary | ICD-10-CM

## 2018-01-11 DIAGNOSIS — M1990 Unspecified osteoarthritis, unspecified site: Secondary | ICD-10-CM | POA: Insufficient documentation

## 2018-01-11 DIAGNOSIS — Z7982 Long term (current) use of aspirin: Secondary | ICD-10-CM | POA: Insufficient documentation

## 2018-01-11 DIAGNOSIS — I10 Essential (primary) hypertension: Secondary | ICD-10-CM | POA: Insufficient documentation

## 2018-01-11 DIAGNOSIS — Z9049 Acquired absence of other specified parts of digestive tract: Secondary | ICD-10-CM | POA: Insufficient documentation

## 2018-01-11 MED ORDER — TRAMADOL HCL 50 MG PO TABS: 50.0000 mg | ORAL_TABLET | Freq: Two times a day (BID) | ORAL | 0 refills | Status: DC | PRN

## 2018-01-11 MED ORDER — PREDNISONE 20 MG PO TABS: 20.0000 mg | ORAL_TABLET | Freq: Every day | ORAL | 0 refills | Status: DC

## 2018-01-11 MED ORDER — MELOXICAM 15 MG PO TABS: 15.0000 mg | ORAL_TABLET | Freq: Every day | ORAL | 3 refills | Status: DC

## 2018-01-11 MED FILL — MELOXICAM 15 MG TABLET: 15 | 30 days supply | Qty: 30 | Fill #0

## 2018-01-11 MED FILL — traMADol HCL 50 MG TABS: 50 | 15 days supply | Qty: 30 | Fill #0

## 2018-01-11 MED FILL — predniSONE 20 MG TABS: 20 | 5 days supply | Qty: 5 | Fill #0

## 2018-01-11 NOTE — Patient Instructions (Signed)
Artrosis  Osteoarthritis  La artrosis es un tipo de reumatismo articular que afecta el tejido que cubre los extremos de los huesos en las articulaciones (cartlago). El cartlago acta como amortiguador entre los huesos y los ayuda a moverse con suavidad. La artrosis se produce cuando el cartlago de las articulaciones se gasta. A veces, la artrosis se denomina reumatismo articular "por uso y desgaste".  La artrosis es la forma ms frecuente de reumatismo articular. A menudo, afecta a las personas mayores. Es una enfermedad que empeora con el tiempo (una enfermedad progresiva). Esta enfermedad afecta con ms frecuencia las articulaciones de:   Los dedos de las manos.   Los dedos de los pies.   Las caderas.   Las rodillas.   La columna vertebral, incluido el cuello y la zona lumbar.    Cules son las causas?  Esta enfermedad es causada por el desgaste del cartlago que cubre los extremos de los huesos, lo cual tiene relacin con la edad.  Qu incrementa el riesgo?  Los siguientes factores pueden hacer que usted sea ms propenso a tener esta enfermedad:   Edad avanzada.   Tener exceso de peso u obesidad.   Uso excesivo de las articulaciones, como en el caso de los atletas.   Lesin pasada de una articulacin.   Ciruga pasada en una articulacin.   Antecedentes familiares de artrosis.    Cules son los signos o los sntomas?  Los principales sntomas de esta enfermedad son dolor, hinchazn y rigidez en la articulacin. Con el tiempo, la articulacin puede perder su forma. Se pueden desprender pequeos trozos de hueso o cartlago y flotar dentro de la articulacin, lo cual puede causar ms dolor y dao en la articulacin. Pueden formarse pequeos depsitos de hueso (ostefitos) en los extremos de la articulacin. Otros sntomas pueden incluir lo siguiente:   Una sensacin de chirrido o raspado dentro de la articulacin al moverla.   Sonidos de chasquido o crujido al moverse.    Los sntomas pueden  afectar una o ms articulaciones. La artrosis en una articulacin principal, como la rodilla o la cadera, puede causar dolor al caminar o al realizar ejercicio. Si tiene artrosis en las manos, es posible que no pueda agarrar objetos, torcer la mano o controlar pequeos movimientos de las manos y los dedos (motricidad fina).  Cmo se diagnostica?  Esta enfermedad se puede diagnosticar en funcin de lo siguiente:   Sus antecedentes mdicos.   Un examen fsico.   Sus sntomas.   Radiografas de la(s) articulacin(es) afectada(s).   Anlisis de sangre para descartar otros tipos de reumatismo articular.    Cmo se trata?  No hay cura para esta enfermedad, pero el tratamiento puede ayudar a controlar el dolor y mejorar el funcionamiento de la articulacin. Los planes de tratamiento pueden incluir lo siguiente:   Un programa de ejercicios indicado que permita el descanso y el alivio de la articulacin. Puede trabajar con un fisioterapeuta.   Un plan de control del peso.   Tcnicas de alivio del dolor, como las siguientes:  ? Aplicacin de calor y fro en la articulacin.  ? Impulsos elctricos aplicados a las terminaciones nerviosas que se encuentran debajo de la piel (neuroestimulacin elctrica transcutnea [TENS]).  ? Masajes.  ? Ciertos suplementos nutricionales.   Antiiflamatorios no esteroideos o medicamentos recetados para ayudar a aliviar el dolor.   Medicamentos para ayudar a aliviar el dolor y la inflamacin (corticoesteroides). Estos se pueden administrar por boca (va oral) o mediante una inyeccin.     Dispositivos de ayuda, como un dispositivo ortopdico, una frula, un guante especial o un bastn.   Ciruga, como:  ? Una osteotoma. Se hace para reposicionar los huesos y aliviar el dolor o para retirar los trozos sueltos de hueso y cartlago.  ? Ciruga de reemplazo articular. Es posible que necesite esta ciruga si tiene una artrosis muy grave (avanzada).    Siga estas instrucciones en su  casa:  Actividad   Haga descansar las articulaciones afectadas segn las indicaciones del mdico.   No conduzca ni use maquinaria pesada mientras toma analgsicos recetados.   Practique los ejercicios que le indiquen. Es posible que el mdico o el fisioterapeuta le recomienden tipos especficos de ejercicios, tales como:  ? Ejercicios de fortalecimiento. Se realizan para fortalecer los msculos que sostienen las articulaciones afectadas por el reumatismo. Pueden realizarse con peso o con bandas para agregar resistencia.  ? Actividades aerbicas. Son ejercicios, como caminar a paso ligero o hacer gimnasia aerbica acutica, que aumentan la actividad del corazn.  ? Actividades de amplitud de movimientos. Facilitan el movimiento de las articulaciones.  ? Ejercicios de equilibrio y agilidad.  Control del dolor, la rigidez y la hinchazn   Si se lo indican, aplique calor en la zona afectada tan frecuentemente como se lo haya indicado el mdico. Use la fuente de calor que el mdico le recomiende, como una compresa de calor hmedo o una almohadilla trmica.  ? Si tiene un dispositivo de ayuda que se puede quitar, quteselo segn lo indicado por su mdico.  ? Colquese una toalla entre la piel y la fuente de calor. Si el mdico le indica que no se quite el dispositivo de ayuda mientras se aplica calor, coloque una toalla entre el dispositivo de ayuda y la fuente de calor.  ? Aplique el calor durante 20 a 30minutos.  ? Retire la fuente de calor si la piel se le pone de color rojo brillante. Esto es muy importante si no puede sentir el dolor, el calor ni el fro. Puede correr un riesgo mayor de sufrir quemaduras.   Si se lo indican, aplique hielo sobre la articulacin afectada:  ? Si tiene un dispositivo de ayuda que se puede quitar, quteselo segn lo indicado por su mdico.  ? Ponga el hielo en una bolsa plstica.  ? Colquese una toalla entre la piel y la bolsa de hielo. Si el mdico le indica que no se quite el  dispositivo de ayuda mientras se aplica hielo, coloque una toalla entre el dispositivo de ayuda y la bolsa de hielo.  ? Coloque el hielo durante 20minutos, 2 o 3veces por da.  Instrucciones generales   Tome los medicamentos de venta libre y los recetados solamente como se lo haya indicado el mdico.   Mantenga un peso saludable. Siga las instrucciones de su mdico con respecto al control del peso. Estas pueden incluir restricciones en la dieta.   No consuma ningn producto que contenga nicotina o tabaco, como cigarrillos y cigarrillos electrnicos. Estos pueden retrasar la consolidacin del hueso. Si necesita ayuda para dejar de fumar, consulte al mdico.   Use los dispositivos de ayuda como se lo haya indicado el mdico.   Concurra a todas las visitas de control como se lo haya indicado el mdico. Esto es importante.  Dnde encontrar ms informacin:   Instituto Nacional de Reumatismo Articular y Enfermedades Musculoesquelticas y Dermatolgicas (National Institute of Arthritis and Musculoskeletal and Skin Diseases): www.niams.nih.gov   Instituto Nacional sobre el Envejecimiento (National Institute on   Aging): www.nia.nih.gov   Instituto Estadounidense de Reumatologa (American College of Rheumatology): www.rheumatology.org  Comunquese con un mdico si:   La piel se pone roja.   Le aparece una erupcin cutnea.   Siente un dolor que empeora.   Tiene fiebre y siente dolor en la articulacin o el msculo.  Solicite ayuda de inmediato si:   Pierde mucho peso.   Pierde el apetito repentinamente.   Tiene sudoracin nocturna.  Resumen   La artrosis es un tipo de reumatismo articular que afecta el tejido que cubre los extremos de los huesos en las articulaciones (cartlago).   Esta enfermedad es causada por el desgaste del cartlago que cubre los extremos de los huesos, lo cual tiene relacin con la edad.   Los principales sntomas de esta enfermedad son dolor, hinchazn y rigidez en la  articulacin.   No hay cura para esta enfermedad, pero el tratamiento puede ayudar a controlar el dolor y mejorar el funcionamiento de la articulacin.  Esta informacin no tiene como fin reemplazar el consejo del mdico. Asegrese de hacerle al mdico cualquier pregunta que tenga.  Document Released: 03/12/2005 Document Revised: 04/28/2016 Document Reviewed: 02/07/2013  Elsevier Interactive Patient Education  2018 [employer] Inc[employer]

## 2018-01-11 NOTE — Progress Notes (Signed)
Subjective:  Patient ID: Thomas Ortiz, male    DOB: [birth year]  Age: 62 y.o. MRN: [id number]  CC: Hypertension   HPI Thomas Ortiz is a 62 year old male with a history of hypertension, chronic knee and feet pain ever since an accident at work in 2011 where he fell off the roof. His pain is controlled on meloxicam but he has noticed intermittent pain in both hands especially in his knuckles which prevent him from making complete fist; symptoms have been present for the last 3 weeks and he states that uncontrolled on meloxicam. He denies numbness, tingling in his hands and has not been dropping things. He has chronic vertigo and uses his meclizine intermittently.  Past Medical History:  Diagnosis Date  . Hypertension 2007    Past Surgical History:  Procedure Laterality Date  . bilateral foot sx  2011  . CHOLECYSTECTOMY N/A 01/30/2015   Procedure: LAPAROSCOPIC CHOLECYSTECTOMY WITH INTRAOPERATIVE CHOLANGIOGRAM;  Surgeon: Manus Rudd, MD;  Location: [facility] OR;  Service: General;  Laterality: N/A;  . COLONOSCOPY N/A 08/17/2015   Procedure: COLONOSCOPY;  Surgeon: West Bali, MD;  Location: [facility];  Service: Endoscopy;  Laterality: N/A;  1:45 PM     No Known Allergies  Outpatient Medications Prior to Visit  Medication Sig Dispense Refill  . amLODipine (NORVASC) 5 MG tablet Take 1 tablet (5 mg total) by mouth daily. 30 tablet 6  . aspirin EC 81 MG tablet Take 1 tablet (81 mg total) by mouth daily. 90 tablet 3  . meclizine (ANTIVERT) 25 MG tablet Take 1 tablet (25 mg total) by mouth 2 (two) times daily as needed for dizziness. 60 tablet 3  . rosuvastatin (CRESTOR) 10 MG tablet Take 1 tablet (10 mg total) by mouth daily. 90 tablet 3  . meloxicam (MOBIC) 15 MG tablet Take 1 tablet (15 mg total) by mouth daily. With food 30 tablet 3   No facility-administered medications prior to visit.     ROS Review of Systems  Constitutional: Negative for activity change and appetite change.    HENT: Negative for sinus pressure and sore throat.   Eyes: Negative for visual disturbance.  Respiratory: Negative for cough, chest tightness and shortness of breath.   Cardiovascular: Negative for chest pain and leg swelling.  Gastrointestinal: Negative for abdominal distention, abdominal pain, constipation and diarrhea.  Endocrine: Negative.   Genitourinary: Negative for dysuria.  Musculoskeletal: Negative for joint swelling and myalgias.  Skin: Negative for rash.  Allergic/Immunologic: Negative.   Neurological: Negative for weakness, light-headedness and numbness.  Psychiatric/Behavioral: Negative for dysphoric mood and suicidal ideas.    Objective:  BP 130/77   Pulse (!) 58   Temp 98.2 F (36.8 C) (Oral)   Ht 5\' 2"  (1.575 m)   Wt 143 lb 9.6 oz (65.1 kg)   SpO2 99%   BMI 26.26 kg/m   BP/Weight 01/11/2018 01/05/2018 11/20/2017  Systolic BP 130 130 136  Diastolic BP 77 82 80  Wt. (Lbs) 143.6 144.2 142  BMI 26.26 26.37 25.15      Physical Exam  Constitutional: He is oriented to person, place, and time. He appears well-developed and well-nourished.  Cardiovascular: Normal rate, normal heart sounds and intact distal pulses.  No murmur heard. Pulmonary/Chest: Effort normal and breath sounds normal. He has no wheezes. He has no rales. He exhibits no tenderness.  Abdominal: Soft. Bowel sounds are normal. He exhibits no distension and no mass. There is no tenderness.  Musculoskeletal:  Tenderness on palpation of palmar  aspect of MCP joints bilaterally.  Unable to make a complete fist in both hands.  Neurological: He is alert and oriented to person, place, and time.  Skin: Skin is warm and dry.  Psychiatric: He has a normal mood and affect.     CMP Latest Ref Rng & Units 01/05/2018 09/02/2017 02/26/2017  Glucose 65 - 99 mg/dL 97 84 96  BUN 8 - 27 mg/dL 18 15 15   Creatinine 0.76 - 1.27 mg/dL 4.091.06 8.111.05 9.140.99  Sodium 134 - 144 mmol/L 137 142 143  Potassium 3.5 - 5.2 mmol/L 4.7  4.6 4.2  Chloride 96 - 106 mmol/L 102 106 105  CO2 20 - 29 mmol/L 23 23 21   Calcium 8.6 - 10.2 mg/dL 9.2 9.5 9.0  Total Protein 6.0 - 8.5 g/dL 6.8 7.0 7.0  Total Bilirubin 0.0 - 1.2 mg/dL 0.5 0.4 0.3  Alkaline Phos 39 - 117 IU/L 97 106 103  AST 0 - 40 IU/L 16 19 13   ALT 0 - 44 IU/L 9 8 8     Lipid Panel     Component Value Date/Time   CHOL 228 (H) 01/05/2018 1140   TRIG 135 01/05/2018 1140   HDL 72 01/05/2018 1140   CHOLHDL 3.2 01/05/2018 1140   LDLCALC 129 (H) 01/05/2018 1140    Assessment & Plan:   1. Pain in both feet Doing well on Meloxicam  2. Vertigo Uses meclizine as needed  3. Primary osteoarthritis of both hands Continue Mobic once course of prednisone has been completed - predniSONE (DELTASONE) 20 MG tablet; Take 1 tablet (20 mg total) by mouth daily with breakfast.  Dispense: 5 tablet; Refill: 0  4. Bilateral foot pain - meloxicam (MOBIC) 15 MG tablet; Take 1 tablet (15 mg total) by mouth daily. With food  Dispense: 30 tablet; Refill: 3   Meds ordered this encounter  Medications  . predniSONE (DELTASONE) 20 MG tablet    Sig: Take 1 tablet (20 mg total) by mouth daily with breakfast.    Dispense:  5 tablet    Refill:  0  . traMADol (ULTRAM) 50 MG tablet    Sig: Take 1 tablet (50 mg total) by mouth every 12 (twelve) hours as needed.    Dispense:  30 tablet    Refill:  0  . meloxicam (MOBIC) 15 MG tablet    Sig: Take 1 tablet (15 mg total) by mouth daily. With food    Dispense:  30 tablet    Refill:  3    Follow-up: Return in about 6 months (around 07/14/2018) for follow up of chronic medical conditions.   Hoy RegisterEnobong Florabelle Cardin MD

## 2018-01-12 NOTE — Telephone Encounter (Signed)
Left message to call back  

## 2018-02-23 MED FILL — ROSUVASTATIN CALCIUM 10 MG: 10 | 30 days supply | Qty: 30 | Fill #1

## 2018-02-23 MED FILL — MECLIZINE 25 MG TABLET: 25 | 30 days supply | Qty: 60 | Fill #1

## 2018-02-24 NOTE — Progress Notes (Signed)
Cardiology Office Note:    Date:  02/25/2018   ID:  Thomas Ortiz, DOB [birth year], MRN [id number]  PCP:  Hoy Register, MD  Cardiologist:  No primary care provider on file.   Referring MD: Hoy Register, MD   Chief Complaint  Patient presents with  . Coronary Artery Disease    History of Present Illness:    Thomas Ortiz is a 62 y.o. male with a hx of chest pain , elevated BP, and bradycardia.  He was seen recently for evaluation to exclude coronary disease as a cause of chest discomfort.  Now having lower back discomfort that radiates into the hips.  Happens only when he walks.  He has excellent dorsalis pedis pulses in both feet.  With medication adjustment chest discomfort has completely resolved.  He denies dyspnea and medication side effects.    Past Medical History:  Diagnosis Date  . Hypertension 2007    Past Surgical History:  Procedure Laterality Date  . bilateral foot sx  2011  . CHOLECYSTECTOMY N/A 01/30/2015   Procedure: LAPAROSCOPIC CHOLECYSTECTOMY WITH INTRAOPERATIVE CHOLANGIOGRAM;  Surgeon: Manus Rudd, MD;  Location: [facility] OR;  Service: General;  Laterality: N/A;  . COLONOSCOPY N/A 08/17/2015   Procedure: COLONOSCOPY;  Surgeon: West Bali, MD;  Location: [facility];  Service: Endoscopy;  Laterality: N/A;  1:45 PM    Current Medications: Current Meds  Medication Sig  . amLODipine (NORVASC) 5 MG tablet Take 1 tablet (5 mg total) by mouth daily.  [name] aspirin EC 81 MG tablet Take 1 tablet (81 mg total) by mouth daily.  . meclizine (ANTIVERT) 25 MG tablet Take 1 tablet (25 mg total) by mouth 2 (two) times daily as needed for dizziness.  . meloxicam (MOBIC) 15 MG tablet Take 1 tablet (15 mg total) by mouth daily. With food  . predniSONE (DELTASONE) 20 MG tablet Take 1 tablet (20 mg total) by mouth daily with breakfast.  . rosuvastatin (CRESTOR) 10 MG tablet Take 1 tablet (10 mg total) by mouth daily.  . traMADol (ULTRAM) 50 MG tablet Take 1 tablet (50 mg  total) by mouth every 12 (twelve) hours as needed.     Allergies:   Patient has no known allergies.   Social History   Socioeconomic History  . Marital status: Married    Spouse name: Not on file  . Number of children: Not on file  . Years of education: Not on file  . Highest education level: Not on file  Occupational History  . Not on file  Social Needs  . Financial resource strain: Not on file  . Food insecurity:    Worry: Not on file    Inability: Not on file  . Transportation needs:    Medical: Not on file    Non-medical: Not on file  Tobacco Use  . Smoking status: Former Games developer  . Smokeless tobacco: Never Used  Substance and Sexual Activity  . Alcohol use: No    Alcohol/week: 2.0 standard drinks    Types: 2 Cans of beer per week    Comment: occasionally  . Drug use: No  . Sexual activity: Not on file  Lifestyle  . Physical activity:    Days per week: Not on file    Minutes per session: Not on file  . Stress: Not on file  Relationships  . Social connections:    Talks on phone: Not on file    Gets together: Not on file    Attends religious service: Not  on file    Active member of club or organization: Not on file    Attends meetings of clubs or organizations: Not on file    Relationship status: Not on file  Other Topics Concern  . Not on file  Social History Narrative  . Not on file     Family History: The patient's family history is not on file.  ROS:   Please see the history of present illness.    Did not start metoprolol as recommended.  He is having anxiety, difficulty with balance, muscle pain, back pain, dizziness, and fatigue.  All other systems reviewed and are negative.  EKGs/Labs/Other Studies Reviewed:    The following studies were reviewed today: None  EKG:  EKG is not ordered today.   Recent Labs: 09/02/2017: Hemoglobin 12.3; Platelets 333; TSH 1.350 01/05/2018: ALT 9; BUN 18; Creatinine, Ser 1.06; Potassium 4.7; Sodium 137  Recent  Lipid Panel    Component Value Date/Time   CHOL 228 (H) 01/05/2018 1140   TRIG 135 01/05/2018 1140   HDL 72 01/05/2018 1140   CHOLHDL 3.2 01/05/2018 1140   LDLCALC 129 (H) 01/05/2018 1140    Physical Exam:    VS:  BP 134/84   Pulse 65   Ht 5\' 3"  (1.6 m)   Wt 143 lb (64.9 kg)   BMI 25.33 kg/m     Wt Readings from Last 3 Encounters:  02/25/18 143 lb (64.9 kg)  01/11/18 143 lb 9.6 oz (65.1 kg)  01/05/18 144 lb 3.2 oz (65.4 kg)     GEN:  Well nourished, well developed in no acute distress HEENT: Normal NECK: No JVD. LYMPHATICS: No lymphadenopathy CARDIAC: RRR, no murmur, no gallop, no edema. VASCULAR: Does dorsalis pedis bilateral.  No bruits. RESPIRATORY:  Clear to auscultation without rales, wheezing or rhonchi  ABDOMEN: Soft, non-tender, non-distended, No pulsatile mass, MUSCULOSKELETAL: No deformity  SKIN: Warm and dry NEUROLOGIC:  Alert and oriented x 3 PSYCHIATRIC:  Normal affect   ASSESSMENT:    1. Coronary artery calcification seen on CT scan   2. Hyperlipidemia, unspecified hyperlipidemia type   3. Essential hypertension    PLAN:    In order of problems listed above:  1. Chest discomfort has resolved.  Stress test was unremarkable.  Never started beta-blocker as I had recommended.  We will start Toprol-XL 25 mg/day and encourage continued aerobic activity. 2. Low intensity statin therapy was started.  LDL and comprehensive metabolic panel will be done today. 3. Low-dose beta-blocker therapy will help better control blood pressure. 4. Low back discomfort of uncertain etiology, not felt to be related to PAD.  Possibly related to statin therapy but he is on low intensity dose.  Suspect mechanical back issue.  Check lipid panel, encourage continued walking, may need to consider doing a lower extremity Doppler study but current symptoms do not sound vascular.  If angina becomes an issue again, will likely need coronary angiography.   Medication Adjustments/Labs  and Tests Ordered: Current medicines are reviewed at length with the patient today.  Concerns regarding medicines are outlined above.  Orders Placed This Encounter  Procedures  . Hepatic function panel  . Lipid Profile  . Basic metabolic panel   Meds ordered this encounter  Medications  . metoprolol succinate (TOPROL-XL) 25 MG 24 hr tablet    Sig: Take 1 tablet (25 mg total) by mouth daily.    Dispense:  90 tablet    Refill:  3    Patient Instructions  Medication Instructions:  Your physician recommends that you continue on your current medications as directed. Please refer to the Current Medication list given to you today.   Labwork: Your physician recommends that you get lab work today: LIVER, LIPID,BMET today   Testing/Procedures: None   Follow-Up: Your physician wants you to follow-up in 6 months with Dr. Katrinka Blazing. You will receive a reminder letter in the mail two months in advance. If you don't receive a letter, please call our office to schedule the follow-up appointment.   Any Other Special Instructions Will Be Listed Below (If Applicable).     If you need a refill on your cardiac medications before your next appointment, please call your pharmacy.      Signed, Lesleigh Noe, MD  02/25/2018 11:08 AM    [facility]

## 2018-02-25 ENCOUNTER — Other Ambulatory Visit: Payer: No Typology Code available for payment source

## 2018-02-25 ENCOUNTER — Encounter: Payer: Self-pay | Admitting: Interventional Cardiology

## 2018-02-25 ENCOUNTER — Ambulatory Visit (INDEPENDENT_AMBULATORY_CARE_PROVIDER_SITE_OTHER): Payer: No Typology Code available for payment source | Admitting: Interventional Cardiology

## 2018-02-25 VITALS — BP 134/84 | HR 65 | Ht 63.0 in | Wt 143.0 lb

## 2018-02-25 DIAGNOSIS — G8929 Other chronic pain: Secondary | ICD-10-CM

## 2018-02-25 DIAGNOSIS — M5442 Lumbago with sciatica, left side: Secondary | ICD-10-CM

## 2018-02-25 DIAGNOSIS — I2510 Atherosclerotic heart disease of native coronary artery without angina pectoris: Secondary | ICD-10-CM

## 2018-02-25 DIAGNOSIS — I10 Essential (primary) hypertension: Secondary | ICD-10-CM

## 2018-02-25 DIAGNOSIS — M5441 Lumbago with sciatica, right side: Secondary | ICD-10-CM

## 2018-02-25 DIAGNOSIS — E785 Hyperlipidemia, unspecified: Secondary | ICD-10-CM

## 2018-02-25 LAB — LIPID PANEL
CHOLESTEROL TOTAL: 189 mg/dL (ref 100–199)
Chol/HDL Ratio: 2.8 ratio (ref 0.0–5.0)
HDL: 67 mg/dL (ref >39)
LDL Calculated: 100 mg/dL — ABNORMAL HIGH (ref 0–99)
Triglycerides: 111 mg/dL (ref 0–149)
VLDL Cholesterol Cal: 22 mg/dL (ref 5–40)

## 2018-02-25 LAB — HEPATIC FUNCTION PANEL
ALBUMIN: 4.5 g/dL (ref 3.6–4.8)
ALK PHOS: 97 IU/L (ref 39–117)
ALT: 7 IU/L (ref 0–44)
AST: 13 IU/L (ref 0–40)
BILIRUBIN TOTAL: 0.8 mg/dL (ref 0.0–1.2)
Bilirubin, Direct: 0.21 mg/dL (ref 0.00–0.40)
Total Protein: 6.8 g/dL (ref 6.0–8.5)

## 2018-02-25 LAB — BASIC METABOLIC PANEL
BUN/Creatinine Ratio: 15 (ref 10–24)
BUN: 15 mg/dL (ref 8–27)
CALCIUM: 9.3 mg/dL (ref 8.6–10.2)
CO2: 22 mmol/L (ref 20–29)
CREATININE: 1 mg/dL (ref 0.76–1.27)
Chloride: 102 mmol/L (ref 96–106)
GFR calc Af Amer: 92 mL/min/{1.73_m2} (ref >59)
GFR, EST NON AFRICAN AMERICAN: 80 mL/min/{1.73_m2} (ref >59)
Glucose: 91 mg/dL (ref 65–99)
Potassium: 4.6 mmol/L (ref 3.5–5.2)
Sodium: 139 mmol/L (ref 134–144)

## 2018-02-25 MED ORDER — METOPROLOL SUCCINATE ER 25 MG PO TB24: 25.0000 mg | ORAL_TABLET | Freq: Every day | ORAL | 3 refills | Status: DC

## 2018-02-25 MED FILL — METOPROLOL SUCCINATE ER 25: 25 | 30 days supply | Qty: 30 | Fill #0

## 2018-02-25 NOTE — Patient Instructions (Addendum)
Medication Instructions:  Your physician recommends that you continue on your current medications as directed. Please refer to the Current Medication list given to you today.   Labwork: Your physician recommends that you get lab work today: LIVER, LIPID,BMET today   Testing/Procedures: None   Follow-Up: Your physician wants you to follow-up in 6 months with Dr. Katrinka BlazingSmith. You will receive a reminder letter in the mail two months in advance. If you don't receive a letter, please call our office to schedule the follow-up appointment.   Any Other Special Instructions Will Be Listed Below (If Applicable).     If you need a refill on your cardiac medications before your next appointment, please call your pharmacy.

## 2018-03-26 MED FILL — MELOXICAM 15 MG TABLET: 15 | 30 days supply | Qty: 30 | Fill #1

## 2018-04-22 MED FILL — METOPROLOL SUCCINATE ER 25: 25 | 30 days supply | Qty: 30 | Fill #1

## 2018-06-01 MED FILL — MECLIZINE 25 MG TABLET: 25 | 30 days supply | Qty: 60 | Fill #2

## 2018-06-01 MED FILL — MELOXICAM 15 MG TABLET: 15 | 30 days supply | Qty: 30 | Fill #2

## 2018-06-01 MED FILL — METOPROLOL SUCCINATE ER 25: 25 | 30 days supply | Qty: 30 | Fill #2

## 2018-06-01 MED FILL — AMLODIPINE BESYLATE 5 MG TA: 5 | 30 days supply | Qty: 30 | Fill #0

## 2018-06-01 MED FILL — ROSUVASTATIN CALCIUM 10 MG: 10 | 30 days supply | Qty: 30 | Fill #2

## 2018-07-05 ENCOUNTER — Ambulatory Visit: Payer: Self-pay | Attending: Family Medicine

## 2018-07-07 ENCOUNTER — Encounter: Payer: Self-pay | Admitting: Family Medicine

## 2018-07-07 ENCOUNTER — Ambulatory Visit: Payer: Self-pay | Attending: Family Medicine | Admitting: Family Medicine

## 2018-07-07 VITALS — BP 139/75 | HR 72 | Temp 97.9°F | Ht 63.0 in | Wt 146.8 lb

## 2018-07-07 DIAGNOSIS — I10 Essential (primary) hypertension: Secondary | ICD-10-CM | POA: Insufficient documentation

## 2018-07-07 DIAGNOSIS — M79672 Pain in left foot: Secondary | ICD-10-CM | POA: Insufficient documentation

## 2018-07-07 DIAGNOSIS — M79671 Pain in right foot: Secondary | ICD-10-CM | POA: Insufficient documentation

## 2018-07-07 DIAGNOSIS — M19041 Primary osteoarthritis, right hand: Secondary | ICD-10-CM | POA: Insufficient documentation

## 2018-07-07 DIAGNOSIS — Z7982 Long term (current) use of aspirin: Secondary | ICD-10-CM | POA: Insufficient documentation

## 2018-07-07 DIAGNOSIS — M25561 Pain in right knee: Secondary | ICD-10-CM | POA: Insufficient documentation

## 2018-07-07 DIAGNOSIS — Z9049 Acquired absence of other specified parts of digestive tract: Secondary | ICD-10-CM | POA: Insufficient documentation

## 2018-07-07 DIAGNOSIS — M19042 Primary osteoarthritis, left hand: Secondary | ICD-10-CM | POA: Insufficient documentation

## 2018-07-07 DIAGNOSIS — G8929 Other chronic pain: Secondary | ICD-10-CM | POA: Insufficient documentation

## 2018-07-07 DIAGNOSIS — Z79899 Other long term (current) drug therapy: Secondary | ICD-10-CM | POA: Insufficient documentation

## 2018-07-07 DIAGNOSIS — W132XXS Fall from, out of or through roof, sequela: Secondary | ICD-10-CM | POA: Insufficient documentation

## 2018-07-07 DIAGNOSIS — Z791 Long term (current) use of non-steroidal anti-inflammatories (NSAID): Secondary | ICD-10-CM | POA: Insufficient documentation

## 2018-07-07 MED ORDER — AMLODIPINE BESYLATE 5 MG PO TABS: 5.0000 mg | ORAL_TABLET | Freq: Every day | ORAL | 1 refills | Status: DC

## 2018-07-07 MED ORDER — METOPROLOL SUCCINATE ER 25 MG PO TB24: 25.0000 mg | ORAL_TABLET | Freq: Every day | ORAL | 1 refills | Status: DC

## 2018-07-07 MED ORDER — PREDNISONE 20 MG PO TABS: 20.0000 mg | ORAL_TABLET | Freq: Two times a day (BID) | ORAL | 0 refills | Status: DC

## 2018-07-07 MED ORDER — TRAMADOL HCL 50 MG PO TABS: 50.0000 mg | ORAL_TABLET | Freq: Two times a day (BID) | ORAL | 0 refills | Status: DC | PRN

## 2018-07-07 MED ORDER — MELOXICAM 15 MG PO TABS: 15.0000 mg | ORAL_TABLET | Freq: Every day | ORAL | 3 refills | Status: DC

## 2018-07-07 MED ORDER — ROSUVASTATIN CALCIUM 10 MG PO TABS: 10.0000 mg | ORAL_TABLET | Freq: Every day | ORAL | 1 refills | Status: DC

## 2018-07-07 MED FILL — predniSONE 20 MG TABS: 20 | 5 days supply | Qty: 10 | Fill #0

## 2018-07-07 NOTE — Progress Notes (Signed)
Subjective:  Patient ID: Thomas Ortiz, male    DOB: [birth year]  Age: 63 y.o. MRN: [id number]  CC: Hypertension   HPI Thomas QualeJose Stlaurent is a 63 year old male with a history of hypertension, chronic knee and feet pain ever since an accident at work in 2011 where he fell off the roof. He complains of a 1 week h/o R knee pain with no preceding h/o trauma. Pain is severe and located in the lateral aspect of his R knee, does not radiate, worse with walking. Meloxicam which he takes for Osteoarthritis of the hands and feet pain have provided no relief. Compliant with his antihypertensive and denies adverse effects from his medications. Denies chest pain, dyspnea, pedal edema.  Past Medical History:  Diagnosis Date  . Hypertension 2007    Past Surgical History:  Procedure Laterality Date  . bilateral foot sx  2011  . CHOLECYSTECTOMY N/A 01/30/2015   Procedure: LAPAROSCOPIC CHOLECYSTECTOMY WITH INTRAOPERATIVE CHOLANGIOGRAM;  Surgeon: Manus RuddMatthew Tsuei, MD;  [facility];  Service: General;  Laterality: N/A;  . COLONOSCOPY N/A 08/17/2015   Procedure: COLONOSCOPY;  Surgeon: West BaliSandi L Fields, MD;  Location: [facility];  Service: Endoscopy;  Laterality: N/A;  1:45 PM    No Known Allergies   Outpatient Medications Prior to Visit  Medication Sig Dispense Refill  . amLODipine (NORVASC) 5 MG tablet Take 1 tablet (5 mg total) by mouth daily. 30 tablet 6  . meclizine (ANTIVERT) 25 MG tablet Take 1 tablet (25 mg total) by mouth 2 (two) times daily as needed for dizziness. 60 tablet 3  . meloxicam (MOBIC) 15 MG tablet Take 1 tablet (15 mg total) by mouth daily. With food 30 tablet 3  . metoprolol succinate (TOPROL-XL) 25 MG 24 hr tablet Take 1 tablet (25 mg total) by mouth daily. 90 tablet 3  . rosuvastatin (CRESTOR) 10 MG tablet Take 1 tablet (10 mg total) by mouth daily. 90 tablet 3  . traMADol (ULTRAM) 50 MG tablet Take 1 tablet (50 mg total) by mouth every 12 (twelve) hours as needed. 30 tablet 0  .  aspirin EC 81 MG tablet Take 1 tablet (81 mg total) by mouth daily. (Patient not taking: Reported on 07/07/2018) 90 tablet 3  . predniSONE (DELTASONE) 20 MG tablet Take 1 tablet (20 mg total) by mouth daily with breakfast. (Patient not taking: Reported on 07/07/2018) 5 tablet 0   No facility-administered medications prior to visit.     ROS Review of Systems  Constitutional: Negative for activity change and appetite change.  HENT: Negative for sinus pressure and sore throat.   Eyes: Negative for visual disturbance.  Respiratory: Negative for cough, chest tightness and shortness of breath.   Cardiovascular: Negative for chest pain and leg swelling.  Gastrointestinal: Negative for abdominal distention, abdominal pain, constipation and diarrhea.  Endocrine: Negative.   Genitourinary: Negative for dysuria.  Musculoskeletal:       See hpi  Skin: Negative for rash.  Allergic/Immunologic: Negative.   Neurological: Negative for weakness, light-headedness and numbness.  Psychiatric/Behavioral: Negative for dysphoric mood and suicidal ideas.    Objective:  BP 139/75   Pulse 72   Temp 97.9 F (36.6 C) (Oral)   Ht 5\' 3"  (1.6 m)   Wt 146 lb 12.8 oz (66.6 kg)   SpO2 98%   BMI 26.00 kg/m   BP/Weight 07/07/2018 02/25/2018 01/11/2018  Systolic BP 139 134 130  Diastolic BP 75 84 77  Wt. (Lbs) 146.8 143 143.6  BMI 26 25.33 26.26  Physical Exam Constitutional:      Appearance: He is well-developed.  Cardiovascular:     Rate and Rhythm: Normal rate.     Heart sounds: Normal heart sounds. No murmur.  Pulmonary:     Effort: Pulmonary effort is normal.     Breath sounds: Normal breath sounds. No wheezing or rales.  Chest:     Chest wall: No tenderness.  Abdominal:     General: Bowel sounds are normal. There is no distension.     Palpations: Abdomen is soft. There is no mass.     Tenderness: There is no abdominal tenderness.  Musculoskeletal:     Comments: TTP of lateral aspect of R  knee and on ROM. No edema noted L knee is normal  Neurological:     Mental Status: He is alert and oriented to person, place, and time.  Psychiatric:        Mood and Affect: Mood normal.        Behavior: Behavior normal.     CMP Latest Ref Rng & Units 02/25/2018 01/05/2018 09/02/2017  Glucose 65 - 99 mg/dL 91 97 84  BUN 8 - 27 mg/dL 15 18 15   Creatinine 0.76 - 1.27 mg/dL 1.611.00 0.961.06 0.451.05  Sodium 134 - 144 mmol/L 139 137 142  Potassium 3.5 - 5.2 mmol/L 4.6 4.7 4.6  Chloride 96 - 106 mmol/L 102 102 106  CO2 20 - 29 mmol/L 22 23 23   Calcium 8.6 - 10.2 mg/dL 9.3 9.2 9.5  Total Protein 6.0 - 8.5 g/dL 6.8 6.8 7.0  Total Bilirubin 0.0 - 1.2 mg/dL 0.8 0.5 0.4  Alkaline Phos 39 - 117 IU/L 97 97 106  AST 0 - 40 IU/L 13 16 19   ALT 0 - 44 IU/L 7 9 8     Lipid Panel     Component Value Date/Time   CHOL 189 02/25/2018 1103   TRIG 111 02/25/2018 1103   HDL 67 02/25/2018 1103   CHOLHDL 2.8 02/25/2018 1103   LDLCALC 100 (H) 02/25/2018 1103     Assessment & Plan:   1. Essential hypertension Controlled Counseled on blood pressure goal of less than 130/80, low-sodium, DASH diet, medication compliance, 150 minutes of moderate intensity exercise per week. Discussed medication compliance, adverse effects. - amLODipine (NORVASC) 5 MG tablet; Take 1 tablet (5 mg total) by mouth daily.  Dispense: 90 tablet; Refill: 1  2. Primary osteoarthritis of both hands Continue NSAIDS  3. Bilateral foot pain Chronic, secondary to previous trauma - meloxicam (MOBIC) 15 MG tablet; Take 1 tablet (15 mg total) by mouth daily. With food  Dispense: 30 tablet; Refill: 3  4. Acute pain of right knee Unknown etiology Apply ice, use knee brace, rest - predniSONE (DELTASONE) 20 MG tablet; Take 1 tablet (20 mg total) by mouth 2 (two) times daily with a meal.  Dispense: 10 tablet; Refill: 0     Meds ordered this encounter  Medications  . amLODipine (NORVASC) 5 MG tablet    Sig: Take 1 tablet (5 mg total) by  mouth daily.    Dispense:  90 tablet    Refill:  1  . rosuvastatin (CRESTOR) 10 MG tablet    Sig: Take 1 tablet (10 mg total) by mouth daily.    Dispense:  90 tablet    Refill:  1  . traMADol (ULTRAM) 50 MG tablet    Sig: Take 1 tablet (50 mg total) by mouth every 12 (twelve) hours as needed.    Dispense:  30 tablet    Refill:  0  . predniSONE (DELTASONE) 20 MG tablet    Sig: Take 1 tablet (20 mg total) by mouth 2 (two) times daily with a meal.    Dispense:  10 tablet    Refill:  0  . metoprolol succinate (TOPROL-XL) 25 MG 24 hr tablet    Sig: Take 1 tablet (25 mg total) by mouth daily.    Dispense:  90 tablet    Refill:  1  . meloxicam (MOBIC) 15 MG tablet    Sig: Take 1 tablet (15 mg total) by mouth daily. With food    Dispense:  30 tablet    Refill:  3    Follow-up: Return in about 3 months (around 10/06/2018) for follow up of chroni cmedical conditions.   Hoy Register MD

## 2018-07-07 NOTE — Patient Instructions (Signed)
Dolor agudo de rodilla en los adultos  Acute Knee Pain, Adult  El dolor de rodilla puede tener muchas causas. A veces, el dolor de rodilla es repentino (agudo) y puede deberse a daño, hinchazón o irritación de los músculos y tejidos que sostienen la rodilla.  A menudo, el dolor desaparece solo con el tiempo y con reposo. Si el dolor no desaparece, pueden hacerse pruebas para hallar su causa.  Siga estas indicaciones en su casa:  Esté atento a cualquier cambio en los síntomas. Tome estas medidas para aliviar el dolor.  Si tiene una rodillera o un dispositivo ortopédico:    · Use la rodillera o el dispositivo ortopédico como se lo haya indicado el médico. Quíteselo solamente como se lo haya indicado el médico.  · Afloje la rodillera o el dispositivo ortopédico si los dedos de los pies:  ? Hormiguean.  ? Se adormecen.  ? Se tornan fríos y de color azul.  · Mantenga la rodillera o el dispositivo ortopédico limpio.  · Si la rodillera o el dispositivo ortopédico no es impermeable:  ? No deje que se mojen.  ? Cúbralo con un envoltorio hermético cuando tome un baño de inmersión o una ducha.  Actividad  · Descanse la rodilla.  · No haga cosas que le causen dolor.  · Evite las actividades en las que ambos pies no estén en contacto con el piso al mismo tiempo (actividades de alto impacto). Algunos ejemplos son correr, saltar la soga y hacer saltos de tijera.  · Trabaje con un fisioterapeuta para crear un programa de ejercicios seguros, como le haya indicado el médico.  Control del dolor, el entumecimiento y la hinchazón    · Si se lo indican, aplique hielo sobre la rodilla:  ? Ponga el hielo en una bolsa plástica.  ? Coloque una toalla entre la piel y la bolsa.  ? Coloque el hielo durante 20 minutos, 2 a 3 veces por día.  · Si se lo indican, aplique presión (compresión) sobre la rodilla lesionada para controlar la hinchazón, dar apoyo y ayudar a aliviar las molestias. Una venda elástica sirve para la compresión.  Indicaciones  generales  · Tome todos los medicamentos solamente como se lo haya indicado el médico.  · Levante (eleve) la rodilla mientras esté sentado o recostado. Asegúrese de que la rodilla esté en una posición más alta que el nivel del corazón.  · Duerma con una almohada debajo de la rodilla.  · No consuma ningún producto que contenga nicotina o tabaco. Estos incluyen cigarrillos, cigarrillos electrónicos y tabaco para mascar. Estos productos pueden retrasar el proceso de curación. Si necesita ayuda para dejar de fumar, consulte al médico.  · Si tiene sobrepeso, trabaje con su médico y un experto en alimentos (nutricionista) para establecer metas para bajar de peso. El sobrepeso puede aumentar el dolor de rodilla.  · Concurra a todas las visitas de control como se lo haya indicado el médico. Esto es importante.  Comuníquese con un médico si:  · El dolor de rodilla no desaparece.  · El dolor de rodilla cambia o empeora.  · Tiene fiebre junto con dolor de rodilla.  · La rodilla se siente caliente cuando la toca.  · La rodilla le falla o se le queda trabada.  Solicite ayuda inmediatamente si:  · La rodilla se le hincha, y la hinchazón empeora.  · No puede mover la rodilla.  · Siente mucho dolor en la rodilla.  Resumen  · El dolor de rodilla puede tener   muchas causas. A menudo, el dolor desaparece solo con el tiempo y con reposo.  · El médico puede hacerle estudios para averiguar la causa del dolor.  · Esté atento a cualquier cambio en los síntomas. Alivie el dolor con descanso, medicamentos, actividad de poca intensidad y el uso de hielo.  · Solicite ayuda de inmediato si no puede mover la rodilla o si el dolor de rodilla es muy intenso.  Esta información no tiene como fin reemplazar el consejo del médico. Asegúrese de hacerle al médico cualquier pregunta que tenga.  Document Released: 12/28/2013 Document Revised: 01/11/2018 Document Reviewed: 01/11/2018  Elsevier Interactive Patient Education © 2019 Elsevier Inc.

## 2018-07-22 MED FILL — MELOXICAM 15 MG TABLET: 15 | 30 days supply | Qty: 30 | Fill #0

## 2018-07-22 MED FILL — ROSUVASTATIN CALCIUM 10 MG: 10 | 30 days supply | Qty: 30 | Fill #0

## 2018-07-22 MED FILL — AMLODIPINE BESYLATE 5 MG TA: 5 | 30 days supply | Qty: 30 | Fill #0

## 2018-07-22 MED FILL — traMADol HCL 50 MG TABS: 50 | 15 days supply | Qty: 30 | Fill #0

## 2018-07-22 MED FILL — METOPROLOL SUCCINATE ER 25: 25 | 30 days supply | Qty: 30 | Fill #0

## 2018-09-15 MED FILL — MELOXICAM 15 MG TABLET: 15 | 30 days supply | Qty: 30 | Fill #1

## 2018-10-06 ENCOUNTER — Other Ambulatory Visit: Payer: Self-pay

## 2018-10-06 ENCOUNTER — Encounter: Payer: Self-pay | Admitting: Family Medicine

## 2018-10-06 ENCOUNTER — Ambulatory Visit: Payer: Self-pay | Attending: Family Medicine | Admitting: Family Medicine

## 2018-10-06 DIAGNOSIS — I10 Essential (primary) hypertension: Secondary | ICD-10-CM

## 2018-10-06 DIAGNOSIS — M79672 Pain in left foot: Secondary | ICD-10-CM

## 2018-10-06 DIAGNOSIS — E7800 Pure hypercholesterolemia, unspecified: Secondary | ICD-10-CM

## 2018-10-06 DIAGNOSIS — M79671 Pain in right foot: Secondary | ICD-10-CM

## 2018-10-06 DIAGNOSIS — E785 Hyperlipidemia, unspecified: Secondary | ICD-10-CM | POA: Insufficient documentation

## 2018-10-06 MED ORDER — METOPROLOL SUCCINATE ER 25 MG PO TB24: 25.0000 mg | ORAL_TABLET | Freq: Every day | ORAL | 1 refills | Status: DC

## 2018-10-06 MED ORDER — AMLODIPINE BESYLATE 5 MG PO TABS: 5.0000 mg | ORAL_TABLET | Freq: Every day | ORAL | 1 refills | Status: DC

## 2018-10-06 MED ORDER — TRAMADOL HCL 50 MG PO TABS: 50.0000 mg | ORAL_TABLET | Freq: Two times a day (BID) | ORAL | 1 refills | Status: DC | PRN

## 2018-10-06 MED ORDER — MELOXICAM 15 MG PO TABS: 15.0000 mg | ORAL_TABLET | Freq: Every day | ORAL | 3 refills | Status: DC

## 2018-10-06 MED ORDER — ROSUVASTATIN CALCIUM 10 MG PO TABS: 10.0000 mg | ORAL_TABLET | Freq: Every day | ORAL | 1 refills | Status: DC

## 2018-10-06 MED FILL — ROSUVASTATIN CALCIUM 10 MG: 10 | 30 days supply | Qty: 30 | Fill #1

## 2018-10-06 MED FILL — traMADol HCL 50 MG TABS: 50 | 15 days supply | Qty: 30 | Fill #0

## 2018-10-06 MED FILL — ?AMLODIPINE BESYLATE 5MG TA: 5 | 90 days supply | Qty: 90 | Fill #1

## 2018-10-06 MED FILL — MELOXICAM 15 MG TABLET: 15 | 30 days supply | Qty: 30 | Fill #3

## 2018-10-06 MED FILL — ?METOPROLOL SUCC ER 25MG TA: 25 | 90 days supply | Qty: 90 | Fill #1

## 2018-10-06 NOTE — Progress Notes (Signed)
Virtual Visit via Telephone Note  I connected with Thomas Ortiz, on 10/06/2018 at 2:44 PM by telephone and verified that I am speaking with the correct person using two identifiers.   Consent: I discussed the limitations, risks, security and privacy concerns of performing an evaluation and management service by telephone and the availability of in person appointments. I also discussed with the patient that there may be a patient responsible charge related to this service. The patient expressed understanding and agreed to proceed.   Location of Patient: Home  Location of Provider: Clinic   Persons participating in Telemedicine visit: Kolsyn Wescott ID number [id number] Melba Coon Dr. Nelwyn Salisbury    History of Present Illness: Thomas Ortiz is a 63 year old male with a history of hypertension, chronic knee and feet pain ever since an accident at work in 2011 where he fell off the roof. He is able to ambulate without much difficulty but pain persists when he has been on his feet for prolonged periods of time.  Pain is controlled on meloxicam and tramadol. He is requesting refill of his antihypertensive and his statin. Denies chest pain, dyspnea, pedal edema. He has no additional concerns today   Past Medical History:  Diagnosis Date  . Hypertension 2007   No Known Allergies  Current Outpatient Medications on File Prior to Visit  Medication Sig Dispense Refill  . amLODipine (NORVASC) 5 MG tablet Take 1 tablet (5 mg total) by mouth daily. 90 tablet 1  . meloxicam (MOBIC) 15 MG tablet Take 1 tablet (15 mg total) by mouth daily. With food 30 tablet 3  . metoprolol succinate (TOPROL-XL) 25 MG 24 hr tablet Take 1 tablet (25 mg total) by mouth daily. 90 tablet 1  . rosuvastatin (CRESTOR) 10 MG tablet Take 1 tablet (10 mg total) by mouth daily. 90 tablet 1  . traMADol (ULTRAM) 50 MG tablet Take 1 tablet (50 mg total) by mouth every 12 (twelve) hours as needed. 30 tablet  0  . aspirin EC 81 MG tablet Take 1 tablet (81 mg total) by mouth daily. (Patient not taking: Reported on 07/07/2018) 90 tablet 3  . predniSONE (DELTASONE) 20 MG tablet Take 1 tablet (20 mg total) by mouth 2 (two) times daily with a meal. (Patient not taking: Reported on 10/06/2018) 10 tablet 0   No current facility-administered medications on file prior to visit.     Observations/Objective: Alert, awake, oriented x3 Not in acute distress  CMP Latest Ref Rng & Units 02/25/2018 01/05/2018 09/02/2017  Glucose 65 - 99 mg/dL 91 97 84  BUN 8 - 27 mg/dL 15 18 15   Creatinine 0.76 - 1.27 mg/dL 4.78 2.95 6.21  Sodium 134 - 144 mmol/L 139 137 142  Potassium 3.5 - 5.2 mmol/L 4.6 4.7 4.6  Chloride 96 - 106 mmol/L 102 102 106  CO2 20 - 29 mmol/L 22 23 23   Calcium 8.6 - 10.2 mg/dL 9.3 9.2 9.5  Total Protein 6.0 - 8.5 g/dL 6.8 6.8 7.0  Total Bilirubin 0.0 - 1.2 mg/dL 0.8 0.5 0.4  Alkaline Phos 39 - 117 IU/L 97 97 106  AST 0 - 40 IU/L 13 16 19   ALT 0 - 44 IU/L 7 9 8     Assessment and Plan: 1. Essential hypertension Keep log of blood pressure at home Counseled on blood pressure goal of less than 130/80, low-sodium, DASH diet, medication compliance, 150 minutes of moderate intensity exercise per week. Discussed medication compliance, adverse effects. - amLODipine (NORVASC) 5 MG tablet; Take  1 tablet (5 mg total) by mouth daily.  Dispense: 90 tablet; Refill: 1 - metoprolol succinate (TOPROL-XL) 25 MG 24 hr tablet; Take 1 tablet (25 mg total) by mouth daily.  Dispense: 90 tablet; Refill: 1  2. Bilateral foot pain Persistent but stable on current regimen - meloxicam (MOBIC) 15 MG tablet; Take 1 tablet (15 mg total) by mouth daily. With food  Dispense: 30 tablet; Refill: 3 - traMADol (ULTRAM) 50 MG tablet; Take 1 tablet (50 mg total) by mouth every 12 (twelve) hours as needed.  Dispense: 30 tablet; Refill: 1  3. Pure hypercholesterolemia Controlled Continue Crestor Low-cholesterol diet   Follow Up  Instructions: 3 months   I discussed the assessment and treatment plan with the patient. The patient was provided an opportunity to ask questions and all were answered. The patient agreed with the plan and demonstrated an understanding of the instructions.   The patient was advised to call back or seek an in-person evaluation if the symptoms worsen or if the condition fails to improve as anticipated.     I provided 12 minutes total of non-face-to-face time during this encounter including median intraservice time, reviewing previous notes, labs, imaging, medications and explaining diagnosis and management.     Hoy RegisterEnobong Sunny Gains, MD, FAAFP. [facility] [place], [place] [phone]   10/06/2018, 2:44 PM

## 2018-10-06 NOTE — Progress Notes (Signed)
Patient has been called and DOB has been verified. Patient has been screened and transferred to PCP to start phone visit.  C/C: Hypertension    

## 2019-01-05 ENCOUNTER — Other Ambulatory Visit: Payer: Self-pay | Admitting: Family Medicine

## 2019-01-05 DIAGNOSIS — R42 Dizziness and giddiness: Secondary | ICD-10-CM

## 2019-01-05 MED FILL — ?METOPROLOL SUCC ER 25MG TA: 25 | 60 days supply | Qty: 60 | Fill #2

## 2019-01-05 MED FILL — ?AMLODIPINE BESYLATE 5MG TA: 5 | 60 days supply | Qty: 60 | Fill #2

## 2019-01-05 MED FILL — ?ROSUVASTATIN CALCIUM 10 MG: 10 | 30 days supply | Qty: 30 | Fill #2

## 2019-01-05 MED FILL — MELOXICAM 15 MG TABLET: 15 | 30 days supply | Qty: 30 | Fill #0

## 2019-02-04 ENCOUNTER — Telehealth: Payer: Self-pay | Admitting: *Deleted

## 2019-02-04 ENCOUNTER — Other Ambulatory Visit: Payer: Self-pay | Admitting: *Deleted

## 2019-02-04 NOTE — Telephone Encounter (Signed)
..   Virtual Visit Pre-Appointment Phone Call  "(Name), I am calling you today to discuss your upcoming appointment. We are currently trying to limit exposure to the virus that causes COVID-19 by seeing patients at home rather than in the office."  1. "What is the BEST phone number to call the day of the visit?" - include this in appointment notes  2. "Do you have or have access to (through a family member/friend) a smartphone with video capability that we can use for your visit?" a. If yes - list this number in appt notes as "cell" (if different from BEST phone #) and list the appointment type as a VIDEO visit in appointment notes b. If no - list the appointment type as a PHONE visit in appointment notes  3. Confirm consent - "In the setting of the current Covid19 crisis, you are scheduled for a (phone or video) visit with your provider on (date) at (time).  Just as we do with many in-office visits, in order for you to participate in this visit, we must obtain consent.  If you'd like, I can send this to your mychart (if signed up) or email for you to review.  Otherwise, I can obtain your verbal consent now.  All virtual visits are billed to your insurance company just like a normal visit would be.  By agreeing to a virtual visit, we'd like you to understand that the technology does not allow for your provider to perform an examination, and thus may limit your provider's ability to fully assess your condition. If your provider identifies any concerns that need to be evaluated in person, we will make arrangements to do so.  Finally, though the technology is pretty good, we cannot assure that it will always work on either your or our end, and in the setting of a video visit, we may have to convert it to a phone-only visit.  In either situation, we cannot ensure that we have a secure connection.  Are you willing to proceed?" STAFF: Did the patient verbally acknowledge consent to telehealth visit? Document  YES/NO here: YES  4. Advise patient to be prepared - "Two hours prior to your appointment, go ahead and check your blood pressure, pulse, oxygen saturation, and your weight (if you have the equipment to check those) and write them all down. When your visit starts, your provider will ask you for this information. If you have an Apple Watch or Kardia device, please plan to have heart rate information ready on the day of your appointment. Please have a pen and paper handy nearby the day of the visit as well."  5. Give patient instructions for MyChart download to smartphone OR Doximity/[email or web address] as below if video visit (depending on what platform provider is using)  6. Inform patient they will receive a phone call 15 minutes prior to their appointment time (may be from unknown caller ID) so they should be prepared to answer    Thomas Ortiz has been deemed a candidate for a follow-up tele-health visit to limit community exposure during the Covid-19 pandemic. I spoke with the patient via phone to ensure availability of phone/video source, confirm preferred email & phone number, and discuss instructions and expectations.  I reminded Thomas Ortiz to be prepared with any vital sign and/or heart rhythm information that could potentially be obtained via home monitoring, at the time of his visit. I reminded Thomas Ortiz to expect a phone call prior to his visit.  Valrie Hartsley, Buford Bremer L, CMA 02/04/2019 10:57 AM   INSTRUCTIONS FOR DOWNLOADING THE MYCHART APP TO SMARTPHONE  - The patient must first make sure to have activated MyChart and know their login information - If Apple, go to [employer] Store and type in MyChart in the search bar and download the app. If Android, ask patient to go to [employer] Play Store and type in [facility] in the search bar and download the app. The app is free but as with any other app downloads, their phone may require them to verify saved payment information or Apple/Android  password.  - The patient will need to then log into the app with their MyChart username and password, and select [facility] as their healthcare provider to link the account. When it is time for your visit, go to the MyChart app, find appointments, and click Begin Video Visit. Be sure to Select Allow for your device to access the Microphone and Camera for your visit. You will then be connected, and your provider will be with you shortly.  **If they have any issues connecting, or need assistance please contact MyChart service desk [phone])**  **If using a computer, in order to ensure the best quality for their visit they will need to use either of the following Internet Browsers: [employer] Edge, or Google Chrome**  IF USING DOXIMITY or DOXY.ME - The patient will receive a link just prior to their visit by text.     FULL LENGTH CONSENT FOR TELE-HEALTH VISIT   I hereby voluntarily request, consent and authorize [facility] and its employed or contracted physicians, physician assistants, nurse practitioners or other licensed health care professionals (the Practitioner), to provide me with telemedicine health care services (the "Services") as deemed necessary by the treating Practitioner. I acknowledge and consent to receive the Services by the Practitioner via telemedicine. I understand that the telemedicine visit will involve communicating with the Practitioner through live audiovisual communication technology and the disclosure of certain medical information by electronic transmission. I acknowledge that I have been given the opportunity to request an in-person assessment or other available alternative prior to the telemedicine visit and am voluntarily participating in the telemedicine visit.  I understand that I have the right to withhold or withdraw my consent to the use of telemedicine in the course of my care at any time, without affecting my right to future care or treatment,  and that the Practitioner or I may terminate the telemedicine visit at any time. I understand that I have the right to inspect all information obtained and/or recorded in the course of the telemedicine visit and may receive copies of available information for a reasonable fee.  I understand that some of the potential risks of receiving the Services via telemedicine include:  [name]. Delay or interruption in medical evaluation due to technological equipment failure or disruption; . Information transmitted may not be sufficient (e.g. poor resolution of images) to allow for appropriate medical decision making by the Practitioner; and/or  . In rare instances, security protocols could fail, causing a breach of personal health information.  Furthermore, I acknowledge that it is my responsibility to provide information about my medical history, conditions and care that is complete and accurate to the best of my ability. I acknowledge that Practitioner's advice, recommendations, and/or decision may be based on factors not within their control, such as incomplete or inaccurate data provided by me or distortions of diagnostic images or specimens that may result from electronic transmissions. I understand that  the practice of medicine is not an exact science and that Practitioner makes no warranties or guarantees regarding treatment outcomes. I acknowledge that I will receive a copy of this consent concurrently upon execution via email to the email address I last provided but may also request a printed copy by calling the office of [facility].    I understand that my insurance will be billed for this visit.   I have read or had this consent read to me. . I understand the contents of this consent, which adequately explains the benefits and risks of the Services being provided via telemedicine.  . I have been provided ample opportunity to ask questions regarding this consent and the Services and have had my questions  answered to my satisfaction. . I give my informed consent for the services to be provided through the use of telemedicine in my medical care  By participating in this telemedicine visit I agree to the above.

## 2019-02-06 NOTE — Progress Notes (Signed)
Virtual Visit via Video Note   This visit type was conducted due to national recommendations for restrictions regarding the COVID-19 Pandemic (e.g. social distancing) in an effort to limit this patient's exposure and mitigate transmission in our community.  Due to his co-morbid illnesses, this patient is at least at moderate risk for complications without adequate follow up.  This format is felt to be most appropriate for this patient at this time.  All issues noted in this document were discussed and addressed.  A limited physical exam was performed with this format.  Please refer to the patient's chart for his consent to telehealth for [facility].   Date:  02/06/2019   ID:  Thomas Ortiz, DOB 1955-10-15, MRN [id number]  Patient Location: Home Provider Location: Home  PCP:  Charlott Rakes, MD  Cardiologist:  Sinclair Grooms, MD  Electrophysiologist:  None   Evaluation Performed:  Follow-Up Visit  Chief Complaint:  CAD    History of Present Illness:    Thomas Ortiz is a 63 y.o. male with a + CAC on CT, negative nuclear stress test, hyperlipidemia, and recurring CP.  He is doing well. He has not experienced chest pain, orthopnea, or PND.  He has not been very active due to the COVID-19 pandemic.  He is not taking metoprolol as prescribed.  He never got the prescription filled/sent in.  Overall he has no complaints.  He was also prescribed tramadol by primary care, which was also never filled.   The patient does not have symptoms concerning for COVID-19 infection (fever, chills, cough, or new shortness of breath).    Past Medical History:  Diagnosis Date  . Hypertension 2007   Past Surgical History:  Procedure Laterality Date  . bilateral foot sx  2011  . CHOLECYSTECTOMY N/A 01/30/2015   Procedure: LAPAROSCOPIC CHOLECYSTECTOMY WITH INTRAOPERATIVE CHOLANGIOGRAM;  Surgeon: Donnie Mesa, MD;  Location: [place];  Service: General;  Laterality: N/A;  . COLONOSCOPY N/A 08/17/2015   Procedure: COLONOSCOPY;  Surgeon: Danie Binder, MD;  [facility];  Service: Endoscopy;  Laterality: N/A;  1:45 PM     No outpatient medications have been marked as taking for the 02/07/19 encounter (Appointment) with Belva Crome, MD.     Allergies:   Patient has no known allergies.   Social History   Tobacco Use  . Smoking status: Former Research scientist (life sciences)  . Smokeless tobacco: Never Used  Substance Use Topics  . Alcohol use: No    Alcohol/week: 2.0 standard drinks    Types: 2 Cans of beer per week    Comment: occasionally  . Drug use: No     Family Hx: The patient's family history is not on file.  ROS:   Please see the history of present illness.    He has no anxieties.  Is been physically and active.  No leg swelling.  Denies headache or symptoms of stroke. All other systems reviewed and are negative.   Prior CV studies:   The following studies were reviewed today:  CORONARY Ca SCORE 12/24/2017: IMPRESSION: Coronary calcium score of 242. This was 60 percentile for age and sex matched control.  Labs/Other Tests and Data Reviewed:    EKG:  No ECG reviewed.  Recent Labs: 02/25/2018: ALT 7; BUN 15; Creatinine, Ser 1.00; Potassium 4.6; Sodium 139   Recent Lipid Panel Lab Results  Component Value Date/Time   CHOL 189 02/25/2018 11:03 AM   TRIG 111 02/25/2018 11:03 AM   HDL 67  02/25/2018 11:03 AM   CHOLHDL 2.8 02/25/2018 11:03 AM   LDLCALC 100 (H) 02/25/2018 11:03 AM    Wt Readings from Last 3 Encounters:  07/07/18 146 lb 12.8 oz (66.6 kg)  02/25/18 143 lb (64.9 kg)  01/11/18 143 lb 9.6 oz (65.1 kg)     Objective:    Vital Signs:  There were no vitals taken for this visit.   VITAL SIGNS:  reviewed  The patient was unable to provide vital signs.  ASSESSMENT & PLAN:    1. Coronary artery calcification seen on CT scan   2. Hyperlipidemia, unspecified hyperlipidemia type   3. Essential hypertension   4. Angina pectoris (HCC)   5. Educated About  Covid-19 Virus Infection    PLAN:  1. Asymptomatic coronary atherosclerosis.  Primary risk prevention is in effect.  Please see discussion below. 2. He is on rosuvastatin.  We will check a statin panel this month. 3. Target blood pressure should be 130/80 mmHg or less. 4. No anginal complaints on medical therapy. 5. Social distancing, mask wearing, and handwashing stressed.  Overall education and awareness concerning primary/secondary risk prevention was discussed in detail: LDL less than 70, hemoglobin A1c less than 7, blood pressure target less than 130/80 mmHg, >150 minutes of moderate aerobic activity per week, avoidance of smoking, weight control (via diet and exercise), and continued surveillance/management of/for obstructive sleep apnea.   COVID-19 Education: The signs and symptoms of COVID-19 were discussed with the patient and how to seek care for testing (follow up with PCP or arrange E-visit).  The importance of social distancing was discussed today.  Time:   Today, I have spent 15 minutes with the patient with telehealth technology discussing the above problems.     Medication Adjustments/Labs and Tests Ordered: Current medicines are reviewed at length with the patient today.  Concerns regarding medicines are outlined above.   Tests Ordered: No orders of the defined types were placed in this encounter.   Medication Changes: No orders of the defined types were placed in this encounter.   Follow Up:  In Person in 6 month(s)  Signed, Lesleigh NoeHenry W Smith III, MD  02/06/2019 6:52 PM    [facility]

## 2019-02-07 ENCOUNTER — Encounter: Payer: Self-pay | Admitting: Interventional Cardiology

## 2019-02-07 ENCOUNTER — Telehealth (INDEPENDENT_AMBULATORY_CARE_PROVIDER_SITE_OTHER): Payer: Self-pay | Admitting: Interventional Cardiology

## 2019-02-07 ENCOUNTER — Other Ambulatory Visit: Payer: Self-pay

## 2019-02-07 DIAGNOSIS — I10 Essential (primary) hypertension: Secondary | ICD-10-CM

## 2019-02-07 DIAGNOSIS — I2510 Atherosclerotic heart disease of native coronary artery without angina pectoris: Secondary | ICD-10-CM

## 2019-02-07 DIAGNOSIS — E785 Hyperlipidemia, unspecified: Secondary | ICD-10-CM

## 2019-02-07 DIAGNOSIS — I209 Angina pectoris, unspecified: Secondary | ICD-10-CM

## 2019-02-07 DIAGNOSIS — Z7189 Other specified counseling: Secondary | ICD-10-CM

## 2019-02-07 MED ORDER — METOPROLOL SUCCINATE ER 25 MG PO TB24: 25.0000 mg | ORAL_TABLET | Freq: Every day | ORAL | 1 refills | Status: DC

## 2019-02-07 MED FILL — traMADol HCL 50 MG TABS: 50 | 15 days supply | Qty: 30 | Fill #1

## 2019-02-07 NOTE — Patient Instructions (Signed)
Medication Instructions:  Your physician recommends that you continue on your current medications as directed. Please refer to the Current Medication list given to you today.  If you need a refill on your cardiac medications before your next appointment, please call your pharmacy.   Lab work: BMET, Liver and Lipid when you are fasting.  Nothing to eat or drink after midnight except water.  If you have labs (blood work) drawn today and your tests are completely normal, you will receive your results only by: [name] MyChart Message (if you have MyChart) OR . A paper copy in the mail If you have any lab test that is abnormal or we need to change your treatment, we will call you to review the results.  Testing/Procedures: None  Follow-Up: At [facility], you and your health needs are our priority.  As part of our continuing mission to provide you with exceptional heart care, we have created designated Provider Care Teams.  These Care Teams include your primary Cardiologist (physician) and Advanced Practice Providers (APPs -  Physician Assistants and Nurse Practitioners) who all work together to provide you with the care you need, when you need it. You will need a follow up appointment in 6-8 months.  Please call our office 2 months in advance to schedule this appointment.  You may see Sinclair Grooms, MD or one of the following Advanced Practice Providers on your designated Care Team:   Truitt Merle, NP Cecilie Kicks, NP . Kathyrn Drown, NP  Any Other Special Instructions Will Be Listed Below (If Applicable).

## 2019-02-14 ENCOUNTER — Other Ambulatory Visit: Payer: No Typology Code available for payment source

## 2019-02-14 ENCOUNTER — Other Ambulatory Visit: Payer: Self-pay

## 2019-02-14 DIAGNOSIS — E785 Hyperlipidemia, unspecified: Secondary | ICD-10-CM

## 2019-02-14 DIAGNOSIS — I2510 Atherosclerotic heart disease of native coronary artery without angina pectoris: Secondary | ICD-10-CM

## 2019-02-14 DIAGNOSIS — I10 Essential (primary) hypertension: Secondary | ICD-10-CM

## 2019-02-14 LAB — BASIC METABOLIC PANEL
BUN/Creatinine Ratio: 13 (ref 10–24)
BUN: 15 mg/dL (ref 8–27)
CO2: 24 mmol/L (ref 20–29)
Calcium: 9.6 mg/dL (ref 8.6–10.2)
Chloride: 104 mmol/L (ref 96–106)
Creatinine, Ser: 1.18 mg/dL (ref 0.76–1.27)
GFR calc Af Amer: 75 mL/min/{1.73_m2} (ref >59)
GFR calc non Af Amer: 65 mL/min/{1.73_m2} (ref >59)
Glucose: 93 mg/dL (ref 65–99)
Potassium: 4.8 mmol/L (ref 3.5–5.2)
Sodium: 140 mmol/L (ref 134–144)

## 2019-02-14 LAB — LIPID PANEL
Chol/HDL Ratio: 2.7 ratio (ref 0.0–5.0)
Cholesterol, Total: 165 mg/dL (ref 100–199)
HDL: 62 mg/dL (ref >39)
LDL Chol Calc (NIH): 82 mg/dL (ref 0–99)
Triglycerides: 119 mg/dL (ref 0–149)
VLDL Cholesterol Cal: 21 mg/dL (ref 5–40)

## 2019-02-14 LAB — HEPATIC FUNCTION PANEL
ALT: 8 IU/L (ref 0–44)
AST: 13 IU/L (ref 0–40)
Albumin: 4.2 g/dL (ref 3.8–4.8)
Alkaline Phosphatase: 88 IU/L (ref 39–117)
Bilirubin Total: 0.4 mg/dL (ref 0.0–1.2)
Bilirubin, Direct: 0.13 mg/dL (ref 0.00–0.40)
Total Protein: 6.6 g/dL (ref 6.0–8.5)

## 2019-03-15 ENCOUNTER — Encounter (HOSPITAL_COMMUNITY): Payer: Self-pay | Admitting: Emergency Medicine

## 2019-03-15 ENCOUNTER — Emergency Department (HOSPITAL_COMMUNITY)
Admission: EM | Admit: 2019-03-15 | Discharge: 2019-03-15 | Disposition: A | Discharge status: Home / Self Care | Payer: Self-pay | Attending: Emergency Medicine | Admitting: Emergency Medicine

## 2019-03-15 ENCOUNTER — Emergency Department (HOSPITAL_BASED_OUTPATIENT_CLINIC_OR_DEPARTMENT_OTHER): Payer: Self-pay

## 2019-03-15 ENCOUNTER — Emergency Department (HOSPITAL_COMMUNITY): Payer: Self-pay

## 2019-03-15 ENCOUNTER — Other Ambulatory Visit: Payer: Self-pay

## 2019-03-15 DIAGNOSIS — Z87891 Personal history of nicotine dependence: Secondary | ICD-10-CM | POA: Insufficient documentation

## 2019-03-15 DIAGNOSIS — M79661 Pain in right lower leg: Secondary | ICD-10-CM | POA: Insufficient documentation

## 2019-03-15 DIAGNOSIS — M79609 Pain in unspecified limb: Secondary | ICD-10-CM

## 2019-03-15 DIAGNOSIS — Z7982 Long term (current) use of aspirin: Secondary | ICD-10-CM | POA: Insufficient documentation

## 2019-03-15 DIAGNOSIS — M10061 Idiopathic gout, right knee: Secondary | ICD-10-CM | POA: Insufficient documentation

## 2019-03-15 DIAGNOSIS — I10 Essential (primary) hypertension: Secondary | ICD-10-CM | POA: Insufficient documentation

## 2019-03-15 DIAGNOSIS — Z79899 Other long term (current) drug therapy: Secondary | ICD-10-CM | POA: Insufficient documentation

## 2019-03-15 LAB — BASIC METABOLIC PANEL
Anion gap: 9 (ref 5–15)
BUN: 14 mg/dL (ref 8–23)
CO2: 28 mmol/L (ref 22–32)
Calcium: 9.4 mg/dL (ref 8.9–10.3)
Chloride: 103 mmol/L (ref 98–111)
Creatinine, Ser: 0.91 mg/dL (ref 0.61–1.24)
GFR calc Af Amer: >60 mL/min (ref >60)
GFR calc non Af Amer: >60 mL/min (ref >60)
Glucose, Bld: 113 mg/dL — ABNORMAL HIGH (ref 70–99)
Potassium: 4.5 mmol/L (ref 3.5–5.1)
Sodium: 140 mmol/L (ref 135–145)

## 2019-03-15 LAB — CBC
HCT: 39.7 % (ref 39.0–52.0)
Hemoglobin: 13.3 g/dL (ref 13.0–17.0)
MCH: 33.1 pg (ref 26.0–34.0)
MCHC: 33.5 g/dL (ref 30.0–36.0)
MCV: 98.8 fL (ref 80.0–100.0)
Platelets: 245 10*3/uL (ref 150–400)
RBC: 4.02 MIL/uL — ABNORMAL LOW (ref 4.22–5.81)
RDW: 13.4 % (ref 11.5–15.5)
WBC: 9.6 10*3/uL (ref 4.0–10.5)
nRBC: 0 % (ref 0.0–0.2)

## 2019-03-15 LAB — SYNOVIAL CELL COUNT + DIFF, W/ CRYSTALS
Eosinophils-Synovial: 0 % (ref 0–1)
Lymphocytes-Synovial Fld: 0 % (ref 0–20)
Monocyte-Macrophage-Synovial Fluid: 5 % — ABNORMAL LOW (ref 50–90)
Neutrophil, Synovial: 95 % — ABNORMAL HIGH (ref 0–25)
WBC, Synovial: 16660 /mm3 — ABNORMAL HIGH (ref 0–200)

## 2019-03-15 LAB — CBG MONITORING, ED: Glucose-Capillary: 82 mg/dL (ref 70–99)

## 2019-03-15 MED ORDER — LIDOCAINE-EPINEPHRINE (PF) 2 %-1:200000 IJ SOLN
20.0000 mL | Freq: Once | INTRAMUSCULAR | Status: AC
  Administered 2019-03-15: 20 mL via INTRADERMAL
  Filled 2019-03-15: qty 20

## 2019-03-15 MED ORDER — DEXAMETHASONE SODIUM PHOSPHATE 10 MG/ML IJ SOLN
10.0000 mg | Freq: Once | INTRAMUSCULAR | Status: AC
  Administered 2019-03-15: 14:00:00 10 mg via INTRAMUSCULAR
  Filled 2019-03-15: qty 1

## 2019-03-15 MED ORDER — COLCHICINE 0.6 MG PO TABS
0.6000 mg | ORAL_TABLET | Freq: Once | ORAL | Status: AC
  Administered 2019-03-15: 14:00:00 0.6 mg via ORAL
  Filled 2019-03-15: qty 1

## 2019-03-15 NOTE — ED Notes (Signed)
Lunch tray ordered 

## 2019-03-15 NOTE — ED Notes (Signed)
Pt had difficulty bearing weigh and cannot bend R leg

## 2019-03-15 NOTE — Progress Notes (Signed)
RLE venous duplex       has been completed. Preliminary results can be found under CV proc through chart review. Mussa Groesbeck, BS, RDMS, RVT   

## 2019-03-15 NOTE — ED Triage Notes (Signed)
Patient here with right knee pain and right foot pain.  He denies any injury to the area.

## 2019-03-15 NOTE — ED Notes (Signed)
Patient unable to ambulate, required wheel chair to take to restroom.  Patient back in room and changing into a gown.

## 2019-03-15 NOTE — ED Notes (Signed)
CBG 83 
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
## 2019-03-15 NOTE — Discharge Instructions (Signed)
Contact a health care provider if you have: °Another gout attack. °Continuing symptoms of a gout attack after 10 days of treatment. °Side effects from your medicines. °Chills or a fever. °Burning pain when you urinate. °Pain in your lower back or belly. °Get help right away if you: °Have severe or uncontrolled pain. °Cannot urinate. °

## 2019-03-16 LAB — GLUCOSE, BODY FLUID OTHER: Glucose, Body Fluid Other: 83 mg/dL

## 2019-03-18 LAB — BODY FLUID CULTURE: Culture: NO GROWTH

## 2019-04-06 MED FILL — MELOXICAM 15 MG TABLET: 15 | 30 days supply | Qty: 30 | Fill #1

## 2019-04-07 ENCOUNTER — Other Ambulatory Visit: Payer: Self-pay | Admitting: Family Medicine

## 2019-04-07 DIAGNOSIS — M79672 Pain in left foot: Secondary | ICD-10-CM

## 2019-04-07 DIAGNOSIS — M79671 Pain in right foot: Secondary | ICD-10-CM

## 2019-04-07 MED FILL — ?ROSUVASTATIN CALCIUM 10 MG: 10 | 30 days supply | Qty: 30 | Fill #3

## 2019-04-08 MED FILL — traMADol HCL 50 MG TABS: 50 | 15 days supply | Qty: 30 | Fill #0

## 2019-04-08 NOTE — Telephone Encounter (Signed)
Please refill if appropriate

## 2019-04-29 ENCOUNTER — Ambulatory Visit: Payer: Self-pay | Attending: Family Medicine

## 2019-04-29 ENCOUNTER — Other Ambulatory Visit: Payer: Self-pay

## 2019-05-16 ENCOUNTER — Telehealth: Payer: Self-pay | Admitting: Family Medicine

## 2019-05-16 NOTE — Telephone Encounter (Signed)
Pt was sent a letter from financial dept. Inform them, that the application they submitted was incomplete, since they were missing some documentation at the time of the appointment, Pt need to reschedule and resubmit all new papers and application for CAFA and OC, P.S. old documents has been sent back by mail to the Pt and Pt. need to make a new appt. 

## 2019-05-26 ENCOUNTER — Ambulatory Visit: Payer: Self-pay

## 2019-06-01 ENCOUNTER — Other Ambulatory Visit: Payer: Self-pay | Admitting: Family Medicine

## 2019-06-01 DIAGNOSIS — M79671 Pain in right foot: Secondary | ICD-10-CM

## 2019-06-01 MED FILL — ?METOPROLOL SUCC ER 25MG TA: 25 | 30 days supply | Qty: 30 | Fill #0

## 2019-06-01 MED FILL — ?ROSUVASTATIN CALCIUM 10 MG: 10 | 30 days supply | Qty: 30 | Fill #3

## 2019-06-01 MED FILL — MELOXICAM 15 MG TABLET: 15 | 30 days supply | Qty: 30 | Fill #2

## 2019-06-02 MED FILL — traMADol HCL 50 MG TABS: 50 | 15 days supply | Qty: 30 | Fill #0

## 2019-08-08 MED FILL — METOPROLOL SUCCINATE ER 25: 25 | 30 days supply | Qty: 30 | Fill #0

## 2019-08-08 MED FILL — MELOXICAM 15 MG TABLET: 15 | 30 days supply | Qty: 30 | Fill #2

## 2019-08-08 MED FILL — AMLODIPINE BESYLATE 5 MG TA: 5 | 90 days supply | Qty: 90 | Fill #0

## 2019-08-12 ENCOUNTER — Ambulatory Visit: Payer: Self-pay

## 2019-08-18 ENCOUNTER — Ambulatory Visit: Payer: Self-pay | Attending: Internal Medicine

## 2019-09-26 MED FILL — MELOXICAM 15 MG TABLET: 15 | 30 days supply | Qty: 30 | Fill #3

## 2019-09-26 MED FILL — METOPROLOL SUCCINATE ER 25: 25 | 30 days supply | Qty: 30 | Fill #0

## 2019-10-08 ENCOUNTER — Encounter (HOSPITAL_COMMUNITY): Payer: Self-pay | Admitting: Emergency Medicine

## 2019-10-08 ENCOUNTER — Emergency Department (HOSPITAL_COMMUNITY)
Admission: EM | Admit: 2019-10-08 | Discharge: 2019-10-08 | Disposition: A | Discharge status: Home / Self Care | Payer: Self-pay | Attending: Emergency Medicine | Admitting: Emergency Medicine

## 2019-10-08 ENCOUNTER — Other Ambulatory Visit: Payer: Self-pay

## 2019-10-08 DIAGNOSIS — Z5321 Procedure and treatment not carried out due to patient leaving prior to being seen by health care provider: Secondary | ICD-10-CM | POA: Insufficient documentation

## 2019-10-08 DIAGNOSIS — M542 Cervicalgia: Secondary | ICD-10-CM | POA: Insufficient documentation

## 2019-10-08 DIAGNOSIS — R1031 Right lower quadrant pain: Secondary | ICD-10-CM | POA: Insufficient documentation
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
## 2019-10-08 NOTE — ED Notes (Signed)
Called for reasses, no response

## 2019-10-09 ENCOUNTER — Other Ambulatory Visit: Payer: Self-pay

## 2019-10-09 ENCOUNTER — Ambulatory Visit (HOSPITAL_COMMUNITY)
Admission: EM | Admit: 2019-10-09 | Discharge: 2019-10-09 | Disposition: A | Discharge status: Home / Self Care | Payer: Self-pay | Attending: Urgent Care | Admitting: Urgent Care

## 2019-10-09 ENCOUNTER — Encounter (HOSPITAL_COMMUNITY): Payer: Self-pay

## 2019-10-09 ENCOUNTER — Ambulatory Visit (INDEPENDENT_AMBULATORY_CARE_PROVIDER_SITE_OTHER): Payer: Self-pay

## 2019-10-09 DIAGNOSIS — R918 Other nonspecific abnormal finding of lung field: Secondary | ICD-10-CM

## 2019-10-09 DIAGNOSIS — M25511 Pain in right shoulder: Secondary | ICD-10-CM

## 2019-10-09 DIAGNOSIS — M25551 Pain in right hip: Secondary | ICD-10-CM

## 2019-10-09 DIAGNOSIS — R079 Chest pain, unspecified: Secondary | ICD-10-CM

## 2019-10-09 DIAGNOSIS — R0781 Pleurodynia: Secondary | ICD-10-CM

## 2019-10-09 DIAGNOSIS — M542 Cervicalgia: Secondary | ICD-10-CM

## 2019-10-09 DIAGNOSIS — R519 Headache, unspecified: Secondary | ICD-10-CM

## 2019-10-09 DIAGNOSIS — M79671 Pain in right foot: Secondary | ICD-10-CM

## 2019-10-09 DIAGNOSIS — S0081XA Abrasion of other part of head, initial encounter: Secondary | ICD-10-CM

## 2019-10-09 DIAGNOSIS — Z8781 Personal history of (healed) traumatic fracture: Secondary | ICD-10-CM

## 2019-10-09 DIAGNOSIS — R109 Unspecified abdominal pain: Secondary | ICD-10-CM

## 2019-10-09 MED ORDER — TIZANIDINE HCL 4 MG PO TABS: 4.0000 mg | ORAL_TABLET | Freq: Three times a day (TID) | ORAL | 0 refills | Status: DC | PRN
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
## 2019-10-18 ENCOUNTER — Other Ambulatory Visit: Payer: Self-pay

## 2019-10-18 ENCOUNTER — Ambulatory Visit (INDEPENDENT_AMBULATORY_CARE_PROVIDER_SITE_OTHER): Payer: Self-pay | Admitting: Cardiology

## 2019-10-18 ENCOUNTER — Encounter: Payer: Self-pay | Admitting: Cardiology

## 2019-10-18 VITALS — BP 136/80 | HR 66 | Ht 63.0 in | Wt 139.0 lb

## 2019-10-18 DIAGNOSIS — I2510 Atherosclerotic heart disease of native coronary artery without angina pectoris: Secondary | ICD-10-CM

## 2019-10-18 DIAGNOSIS — E785 Hyperlipidemia, unspecified: Secondary | ICD-10-CM

## 2019-10-18 DIAGNOSIS — R42 Dizziness and giddiness: Secondary | ICD-10-CM

## 2019-10-18 DIAGNOSIS — R002 Palpitations: Secondary | ICD-10-CM

## 2019-10-18 DIAGNOSIS — I10 Essential (primary) hypertension: Secondary | ICD-10-CM

## 2019-10-18 MED ORDER — ROSUVASTATIN CALCIUM 40 MG PO TABS: 40.0000 mg | ORAL_TABLET | Freq: Every day | ORAL | 3 refills | Status: DC

## 2019-10-18 MED FILL — ROSUVASTATIN CALCIUM 40 MG: 40 | 30 days supply | Qty: 30 | Fill #0

## 2019-10-18 NOTE — Patient Instructions (Signed)
Medication Instructions:   Your physician has recommended you make the following change in your medication:   1) Start Crestor 40 mg, 1 tablet by mouth once a day  *If you need a refill on your cardiac medications before your next appointment, please call your pharmacy*  Lab Work:  You will have labs drawn today: TSH/CMET/Non fasting lipids  If you have labs (blood work) drawn today and your tests are completely normal, you will receive your results only by: [name] MyChart Message (if you have MyChart) OR . A paper copy in the mail If you have any lab test that is abnormal or we need to change your treatment, we will call you to review the results.  Testing/Procedures:  Your physician has requested that you have a carotid duplex. This test is an ultrasound of the carotid arteries in your neck. It looks at blood flow through these arteries that supply the brain with blood. Allow one hour for this exam. There are no restrictions or special instructions.  A zio monitor was ordered today. It will remain on for 14 days. You will then return monitor and event diary in provided box. It takes 1-2 weeks for report to be downloaded and returned to [place]. We will call you with the results. If monitor falls off or has orange flashing light, please call Zio for further instructions.    Follow-Up: At [facility], you and your health needs are our priority.  As part of our continuing mission to provide you with exceptional heart care, we have created designated Provider Care Teams.  These Care Teams include your primary Cardiologist (physician) and Advanced Practice Providers (APPs -  Physician Assistants and Nurse Practitioners) who all work together to provide you with the care you need, when you need it.  We recommend signing up for the patient portal called "MyChart".  Sign up information is provided on this After Visit Summary.  MyChart is used to connect with patients for Virtual Visits (Telemedicine).   Patients are able to view lab/test results, encounter notes, upcoming appointments, etc.  Non-urgent messages can be sent to your provider as well.   To learn more about what you can do with MyChart, go to [email or web address].    Your next appointment:   4-6 week(s)  The format for your next appointment:   Virtual Visit   Provider:   Georgie Chard, NP

## 2019-10-19 LAB — COMPREHENSIVE METABOLIC PANEL
ALT: 9 IU/L (ref 0–44)
AST: 16 IU/L (ref 0–40)
Albumin/Globulin Ratio: 1.7 (ref 1.2–2.2)
Albumin: 4.6 g/dL (ref 3.8–4.8)
Alkaline Phosphatase: 106 IU/L (ref 39–117)
BUN/Creatinine Ratio: 16 (ref 10–24)
BUN: 16 mg/dL (ref 8–27)
Bilirubin Total: 0.3 mg/dL (ref 0.0–1.2)
CO2: 22 mmol/L (ref 20–29)
Calcium: 9.8 mg/dL (ref 8.6–10.2)
Chloride: 100 mmol/L (ref 96–106)
Creatinine, Ser: 1.03 mg/dL (ref 0.76–1.27)
GFR calc Af Amer: 88 mL/min/{1.73_m2} (ref >59)
GFR calc non Af Amer: 76 mL/min/{1.73_m2} (ref >59)
Globulin, Total: 2.7 g/dL (ref 1.5–4.5)
Glucose: 88 mg/dL (ref 65–99)
Potassium: 4.5 mmol/L (ref 3.5–5.2)
Sodium: 138 mmol/L (ref 134–144)
Total Protein: 7.3 g/dL (ref 6.0–8.5)

## 2019-10-19 LAB — LIPID PANEL
Chol/HDL Ratio: 3.6 ratio (ref 0.0–5.0)
Cholesterol, Total: 225 mg/dL — ABNORMAL HIGH (ref 100–199)
HDL: 62 mg/dL (ref >39)
LDL Chol Calc (NIH): 136 mg/dL — ABNORMAL HIGH (ref 0–99)
Triglycerides: 154 mg/dL — ABNORMAL HIGH (ref 0–149)
VLDL Cholesterol Cal: 27 mg/dL (ref 5–40)

## 2019-10-19 LAB — TSH: TSH: 1.59 u[IU]/mL (ref 0.450–4.500)

## 2019-10-20 ENCOUNTER — Ambulatory Visit (INDEPENDENT_AMBULATORY_CARE_PROVIDER_SITE_OTHER): Payer: Self-pay

## 2019-10-20 ENCOUNTER — Other Ambulatory Visit: Payer: Self-pay

## 2019-10-20 ENCOUNTER — Ambulatory Visit: Payer: Self-pay

## 2019-10-20 DIAGNOSIS — I2510 Atherosclerotic heart disease of native coronary artery without angina pectoris: Secondary | ICD-10-CM

## 2019-10-20 DIAGNOSIS — R42 Dizziness and giddiness: Secondary | ICD-10-CM

## 2019-10-20 DIAGNOSIS — R002 Palpitations: Secondary | ICD-10-CM

## 2019-10-20 DIAGNOSIS — E785 Hyperlipidemia, unspecified: Secondary | ICD-10-CM
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
## 2019-10-26 ENCOUNTER — Other Ambulatory Visit: Payer: Self-pay

## 2019-10-26 ENCOUNTER — Ambulatory Visit: Payer: Self-pay | Attending: Family Medicine | Admitting: Physician Assistant

## 2019-10-26 ENCOUNTER — Other Ambulatory Visit: Payer: Self-pay | Admitting: Physician Assistant

## 2019-10-26 VITALS — BP 129/82 | HR 61 | Ht 63.0 in | Wt 139.4 lb

## 2019-10-26 DIAGNOSIS — Z789 Other specified health status: Secondary | ICD-10-CM

## 2019-10-26 DIAGNOSIS — M545 Low back pain, unspecified: Secondary | ICD-10-CM

## 2019-10-26 DIAGNOSIS — Z09 Encounter for follow-up examination after completed treatment for conditions other than malignant neoplasm: Secondary | ICD-10-CM

## 2019-10-26 DIAGNOSIS — I10 Essential (primary) hypertension: Secondary | ICD-10-CM

## 2019-10-26 DIAGNOSIS — H539 Unspecified visual disturbance: Secondary | ICD-10-CM

## 2019-10-26 MED ORDER — AMLODIPINE BESYLATE 5 MG PO TABS: 5.0000 mg | ORAL_TABLET | Freq: Every day | ORAL | 1 refills | Status: DC

## 2019-10-26 MED ORDER — MELOXICAM 15 MG PO TABS: 15.0000 mg | ORAL_TABLET | Freq: Every day | ORAL | 3 refills | Status: DC

## 2019-10-26 MED ORDER — METOPROLOL SUCCINATE ER 25 MG PO TB24: 25.0000 mg | ORAL_TABLET | Freq: Every day | ORAL | 0 refills | Status: DC

## 2019-10-26 MED ORDER — ROSUVASTATIN CALCIUM 40 MG PO TABS: 40.0000 mg | ORAL_TABLET | Freq: Every day | ORAL | 3 refills | Status: DC

## 2019-10-26 NOTE — Progress Notes (Signed)
Patient had a fall 3 weeks ago and has been having pain in right side since fall.

## 2019-11-02 ENCOUNTER — Other Ambulatory Visit: Payer: Self-pay

## 2019-11-02 ENCOUNTER — Ambulatory Visit (HOSPITAL_COMMUNITY)
Admission: RE | Admit: 2019-11-02 | Discharge: 2019-11-02 | Disposition: A | Discharge status: Home / Self Care | Payer: Self-pay | Source: Ambulatory Visit | Attending: Cardiology | Admitting: Cardiology

## 2019-11-02 DIAGNOSIS — R42 Dizziness and giddiness: Secondary | ICD-10-CM | POA: Insufficient documentation

## 2019-11-02 DIAGNOSIS — R002 Palpitations: Secondary | ICD-10-CM | POA: Insufficient documentation
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
## 2019-11-25 ENCOUNTER — Telehealth (INDEPENDENT_AMBULATORY_CARE_PROVIDER_SITE_OTHER): Payer: Self-pay | Admitting: Cardiology

## 2019-11-25 ENCOUNTER — Encounter: Payer: Self-pay | Admitting: Cardiology

## 2019-11-25 ENCOUNTER — Other Ambulatory Visit: Payer: Self-pay

## 2019-11-25 VITALS — Ht 63.0 in | Wt 136.0 lb

## 2019-11-25 DIAGNOSIS — I2510 Atherosclerotic heart disease of native coronary artery without angina pectoris: Secondary | ICD-10-CM

## 2019-11-25 DIAGNOSIS — R42 Dizziness and giddiness: Secondary | ICD-10-CM

## 2019-11-25 DIAGNOSIS — R002 Palpitations: Secondary | ICD-10-CM

## 2019-11-25 DIAGNOSIS — I6523 Occlusion and stenosis of bilateral carotid arteries: Secondary | ICD-10-CM

## 2019-11-25 NOTE — Patient Instructions (Signed)
Medication Instructions:   Your physician recommends that you continue on your current medications as directed. Please refer to the Current Medication list given to you today.  *If you need a refill on your cardiac medications before your next appointment, please call your pharmacy*  Lab Work:  None ordered today  Testing/Procedures:  None ordered today  Follow-Up: At [facility], you and your health needs are our priority.  As part of our continuing mission to provide you with exceptional heart care, we have created designated Provider Care Teams.  These Care Teams include your primary Cardiologist (physician) and Advanced Practice Providers (APPs -  Physician Assistants and Nurse Practitioners) who all work together to provide you with the care you need, when you need it.  We recommend signing up for the patient portal called "MyChart".  Sign up information is provided on this After Visit Summary.  MyChart is used to connect with patients for Virtual Visits (Telemedicine).  Patients are able to view lab/test results, encounter notes, upcoming appointments, etc.  Non-urgent messages can be sent to your provider as well.   To learn more about what you can do with MyChart, go to [email or web address].    Your next appointment:   12 month(s)  The format for your next appointment:   In Person  Provider:   Henry Smith, MD 

## 2019-11-28 ENCOUNTER — Other Ambulatory Visit: Payer: Self-pay | Admitting: Nurse Practitioner

## 2019-11-28 DIAGNOSIS — I10 Essential (primary) hypertension: Secondary | ICD-10-CM

## 2019-11-28 MED ORDER — METOPROLOL SUCCINATE ER 25 MG PO TB24: 25.0000 mg | ORAL_TABLET | Freq: Every day | ORAL | 0 refills | Status: DC

## 2019-11-28 MED FILL — METOPROLOL SUCCINATE ER 25: 25 | 30 days supply | Qty: 30 | Fill #0

## 2019-12-13 ENCOUNTER — Telehealth: Payer: Self-pay | Admitting: Cardiology

## 2019-12-13 NOTE — Telephone Encounter (Signed)
      I went in pt's chart to see who called him today. It was a reminder for his appt for his lab on 12-16-19.

## 2019-12-16 ENCOUNTER — Other Ambulatory Visit: Payer: Self-pay

## 2020-01-26 ENCOUNTER — Ambulatory Visit: Payer: Self-pay | Attending: Family Medicine | Admitting: Family Medicine

## 2020-01-26 ENCOUNTER — Other Ambulatory Visit: Payer: Self-pay

## 2020-01-26 DIAGNOSIS — M79672 Pain in left foot: Secondary | ICD-10-CM

## 2020-01-26 DIAGNOSIS — E7800 Pure hypercholesterolemia, unspecified: Secondary | ICD-10-CM

## 2020-01-26 DIAGNOSIS — I10 Essential (primary) hypertension: Secondary | ICD-10-CM

## 2020-01-26 DIAGNOSIS — M79671 Pain in right foot: Secondary | ICD-10-CM
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
## 2020-02-08 MED FILL — ROSUVASTATIN CALCIUM 40 MG: 40 | 30 days supply | Qty: 30 | Fill #0

## 2020-02-08 MED FILL — METOPROLOL SUCCINATE ER 25: 25 | 30 days supply | Qty: 30 | Fill #0

## 2020-02-08 MED FILL — MELOXICAM 15 MG TABLET: 15 | 30 days supply | Qty: 30 | Fill #1

## 2020-02-08 MED FILL — AMLODIPINE BESYLATE 5 MG TA: 5 | 30 days supply | Qty: 30 | Fill #0

## 2020-04-11 MED FILL — METOPROLOL SUCCINATE ER 25: 25 | 30 days supply | Qty: 30 | Fill #1

## 2020-04-11 MED FILL — ?ROSUVASTATIN CALCIUM 40M T: 40 | 30 days supply | Qty: 30 | Fill #1

## 2020-04-11 MED FILL — MELOXICAM 15 MG TABLET: 15 | 30 days supply | Qty: 30 | Fill #2

## 2020-04-11 MED FILL — ?AMLODIPINE BESYLATE 5MG TA: 5 | 30 days supply | Qty: 30 | Fill #1

## 2020-05-14 ENCOUNTER — Ambulatory Visit: Payer: Self-pay | Admitting: Family Medicine

## 2020-06-27 ENCOUNTER — Ambulatory Visit: Payer: Self-pay | Admitting: Family Medicine

## 2020-07-10 MED FILL — MELOXICAM 15 MG TABLET: 15 | 30 days supply | Qty: 30 | Fill #3

## 2020-07-10 MED FILL — METOPROLOL SUCCINATE ER 25: 25 | 30 days supply | Qty: 30 | Fill #2

## 2020-07-10 MED FILL — ?ROSUVASTATIN CALCIUM 40M T: 40 | 30 days supply | Qty: 30 | Fill #2

## 2020-07-10 MED FILL — AMLODIPINE BESYLATE 5 MG TA: 5 | 30 days supply | Qty: 30 | Fill #2

## 2020-07-17 ENCOUNTER — Other Ambulatory Visit: Payer: Self-pay | Admitting: Family Medicine

## 2020-07-17 ENCOUNTER — Ambulatory Visit: Payer: Self-pay | Attending: Family Medicine | Admitting: Family Medicine

## 2020-07-17 ENCOUNTER — Encounter: Payer: Self-pay | Admitting: Family Medicine

## 2020-07-17 ENCOUNTER — Other Ambulatory Visit: Payer: Self-pay

## 2020-07-17 VITALS — BP 146/93 | HR 62 | Ht 63.0 in | Wt 143.0 lb

## 2020-07-17 DIAGNOSIS — I10 Essential (primary) hypertension: Secondary | ICD-10-CM

## 2020-07-17 DIAGNOSIS — Z131 Encounter for screening for diabetes mellitus: Secondary | ICD-10-CM

## 2020-07-17 DIAGNOSIS — F0391 Unspecified dementia with behavioral disturbance: Secondary | ICD-10-CM

## 2020-07-17 DIAGNOSIS — Z23 Encounter for immunization: Secondary | ICD-10-CM

## 2020-07-17 DIAGNOSIS — G309 Alzheimer's disease, unspecified: Secondary | ICD-10-CM

## 2020-07-17 DIAGNOSIS — R42 Dizziness and giddiness: Secondary | ICD-10-CM

## 2020-07-17 DIAGNOSIS — H538 Other visual disturbances: Secondary | ICD-10-CM

## 2020-07-17 LAB — POCT GLYCOSYLATED HEMOGLOBIN (HGB A1C): Hemoglobin A1C: 5.6 % (ref 4.0–5.6)

## 2020-07-17 MED ORDER — MEMANTINE HCL 5 MG PO TABS: 5.0000 mg | ORAL_TABLET | Freq: Two times a day (BID) | ORAL | 3 refills | Status: DC

## 2020-07-17 MED ORDER — MECLIZINE HCL 25 MG PO TABS: 25.0000 mg | ORAL_TABLET | Freq: Two times a day (BID) | ORAL | 1 refills | Status: DC | PRN

## 2020-07-17 MED ORDER — ROSUVASTATIN CALCIUM 40 MG PO TABS: 40.0000 mg | ORAL_TABLET | Freq: Every day | ORAL | 1 refills | Status: DC

## 2020-07-17 MED ORDER — METOPROLOL SUCCINATE ER 25 MG PO TB24: 25.0000 mg | ORAL_TABLET | Freq: Every day | ORAL | 1 refills | Status: DC

## 2020-07-17 MED ORDER — AMLODIPINE BESYLATE 5 MG PO TABS
5.0000 mg | ORAL_TABLET | Freq: Every day | ORAL | 1 refills | Status: DC
Start: 2020-07-17 — End: 2020-07-17

## 2020-07-17 MED FILL — MEMANTINE HCL 5 MG TABLET: 5 | 30 days supply | Qty: 60 | Fill #0

## 2020-07-17 MED FILL — ?MECLIZINE 25MG TAB: 25 | 15 days supply | Qty: 30 | Fill #0

## 2020-07-17 NOTE — Progress Notes (Signed)
Has been having blurred vision and dizziness.. Skin color is turning yellow Family member states that he is very forgetful he is beginning to wonder.
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
## 2020-07-18 LAB — CBC WITH DIFFERENTIAL/PLATELET
Basophils Absolute: 0.1 10*3/uL (ref 0.0–0.2)
Basos: 1 %
EOS (ABSOLUTE): 0.1 10*3/uL (ref 0.0–0.4)
Eos: 1 %
Hematocrit: 42.3 % (ref 37.5–51.0)
Hemoglobin: 14 g/dL (ref 13.0–17.7)
Immature Grans (Abs): 0 10*3/uL (ref 0.0–0.1)
Immature Granulocytes: 0 %
Lymphocytes Absolute: 1.5 10*3/uL (ref 0.7–3.1)
Lymphs: 17 %
MCH: 32.3 pg (ref 26.6–33.0)
MCHC: 33.1 g/dL (ref 31.5–35.7)
MCV: 98 fL — ABNORMAL HIGH (ref 79–97)
Monocytes Absolute: 0.6 10*3/uL (ref 0.1–0.9)
Monocytes: 7 %
Neutrophils Absolute: 6.3 10*3/uL (ref 1.4–7.0)
Neutrophils: 74 %
Platelets: 254 10*3/uL (ref 150–450)
RBC: 4.34 x10E6/uL (ref 4.14–5.80)
RDW: 13 % (ref 11.6–15.4)
WBC: 8.5 10*3/uL (ref 3.4–10.8)

## 2020-07-18 LAB — CMP14+EGFR
ALT: 10 IU/L (ref 0–44)
AST: 18 IU/L (ref 0–40)
Albumin/Globulin Ratio: 1.8 (ref 1.2–2.2)
Albumin: 4.8 g/dL (ref 3.8–4.8)
Alkaline Phosphatase: 96 IU/L (ref 44–121)
BUN/Creatinine Ratio: 16 (ref 10–24)
BUN: 19 mg/dL (ref 8–27)
Bilirubin Total: 0.3 mg/dL (ref 0.0–1.2)
CO2: 22 mmol/L (ref 20–29)
Calcium: 9.6 mg/dL (ref 8.6–10.2)
Chloride: 106 mmol/L (ref 96–106)
Creatinine, Ser: 1.19 mg/dL (ref 0.76–1.27)
GFR calc Af Amer: 73 mL/min/{1.73_m2} (ref >59)
GFR calc non Af Amer: 63 mL/min/{1.73_m2} (ref >59)
Globulin, Total: 2.7 g/dL (ref 1.5–4.5)
Glucose: 99 mg/dL (ref 65–99)
Potassium: 4.5 mmol/L (ref 3.5–5.2)
Sodium: 143 mmol/L (ref 134–144)
Total Protein: 7.5 g/dL (ref 6.0–8.5)

## 2020-07-18 LAB — TSH: TSH: 1.36 u[IU]/mL (ref 0.450–4.500)

## 2020-07-18 LAB — T4, FREE: Free T4: 1.39 ng/dL (ref 0.82–1.77)

## 2020-07-19 ENCOUNTER — Telehealth: Payer: Self-pay | Admitting: Family Medicine

## 2020-07-19 NOTE — Telephone Encounter (Signed)
Patient is calling back for lab results. Patient is in need of a Spanish interpreter. CB- [phone] [facility] Patient's [place]

## 2020-07-20 ENCOUNTER — Encounter (HOSPITAL_COMMUNITY): Payer: Self-pay

## 2020-07-20 ENCOUNTER — Ambulatory Visit (HOSPITAL_COMMUNITY)
Admission: EM | Admit: 2020-07-20 | Discharge: 2020-07-20 | Disposition: A | Discharge status: Home / Self Care | Payer: Self-pay | Attending: Urgent Care | Admitting: Urgent Care

## 2020-07-20 ENCOUNTER — Other Ambulatory Visit: Payer: Self-pay

## 2020-07-20 DIAGNOSIS — R42 Dizziness and giddiness: Secondary | ICD-10-CM

## 2020-07-20 DIAGNOSIS — R300 Dysuria: Secondary | ICD-10-CM

## 2020-07-20 DIAGNOSIS — R079 Chest pain, unspecified: Secondary | ICD-10-CM

## 2020-07-20 DIAGNOSIS — H259 Unspecified age-related cataract: Secondary | ICD-10-CM

## 2020-07-20 DIAGNOSIS — H538 Other visual disturbances: Secondary | ICD-10-CM

## 2020-07-20 LAB — POCT URINALYSIS DIPSTICK, ED / UC
Bilirubin Urine: NEGATIVE
Glucose, UA: NEGATIVE mg/dL
Ketones, ur: NEGATIVE mg/dL
Leukocytes,Ua: NEGATIVE
Nitrite: NEGATIVE
Protein, ur: NEGATIVE mg/dL
Specific Gravity, Urine: 1.025 (ref 1.005–1.030)
Urobilinogen, UA: 0.2 mg/dL (ref 0.0–1.0)
pH: 5.5 (ref 5.0–8.0)

## 2020-07-20 NOTE — ED Provider Notes (Signed)
[facility]   MRN: [id number] DOB: 1956-01-04  Subjective:   Thomas Ortiz is a 65 y.o. male presenting for acute onset of headache, lightheadedness, left-sided chest pain 2 hours ago.  Patient thinks that this is from a medicine that he just started for dementia.  He is also had some intermittent painful urination, blurred vision.  He does have a history of age-related cataracts of both eyes, vertigo.  He was recently seen by his regular doctor and had full labs done.  Results are below.  Everything was largely unremarkable.  No current facility-administered medications for this encounter.  Current Outpatient Medications:  .  amLODipine (NORVASC) 5 MG tablet, Take 1 tablet (5 mg total) by mouth daily., Disp: 90 tablet, Rfl: 1 .  meclizine (ANTIVERT) 25 MG tablet, Take 1 tablet (25 mg total) by mouth 2 (two) times daily as needed for dizziness., Disp: 30 tablet, Rfl: 1 .  meloxicam (MOBIC) 15 MG tablet, Take 1 tablet (15 mg total) by mouth daily. With food, Disp: 30 tablet, Rfl: 3 .  memantine (NAMENDA) 5 MG tablet, Take 1 tablet (5 mg total) by mouth 2 (two) times daily., Disp: 60 tablet, Rfl: 3 .  metoprolol succinate (TOPROL-XL) 25 MG 24 hr tablet, Take 1 tablet (25 mg total) by mouth daily., Disp: 90 tablet, Rfl: 1 .  rosuvastatin (CRESTOR) 40 MG tablet, Take 1 tablet (40 mg total) by mouth daily., Disp: 90 tablet, Rfl: 1   No Known Allergies  Past Medical History:  Diagnosis Date  . Hypertension 2007     Past Surgical History:  Procedure Laterality Date  . bilateral foot sx  2011  . CHOLECYSTECTOMY N/A 01/30/2015   Procedure: LAPAROSCOPIC CHOLECYSTECTOMY WITH INTRAOPERATIVE CHOLANGIOGRAM;  Surgeon: Donnie Mesa, MD;  Location: [facility];  Service: General;  Laterality: N/A;  . COLONOSCOPY N/A 08/17/2015   Procedure: COLONOSCOPY;  Surgeon: Danie Binder, MD;  [facility];  Service: Endoscopy;  Laterality: N/A;  1:45 PM    History reviewed. No pertinent  family history.  Social History   Tobacco Use  . Smoking status: Former Research scientist (life sciences)  . Smokeless tobacco: Never Used  Vaping Use  . Vaping Use: Never used  Substance Use Topics  . Alcohol use: No    Alcohol/week: 2.0 standard drinks    Types: 2 Cans of beer per week    Comment: occasionally  . Drug use: No    ROS   Objective:   Vitals: BP (!) 143/94 (BP Location: Right Arm)   Pulse 72   Temp 98.1 F (36.7 C) (Oral)   Resp 17   SpO2 99%   BP Readings from Last 3 Encounters:  07/20/20 (!) 143/94  07/17/20 (!) 146/93  10/26/19 129/82   Physical Exam Constitutional:      General: He is not in acute distress.    Appearance: Normal appearance. He is well-developed. He is not ill-appearing, toxic-appearing or diaphoretic.  HENT:     Head: Normocephalic and atraumatic.     Right Ear: External ear normal.     Left Ear: External ear normal.     Nose: Nose normal.     Mouth/Throat:     Mouth: Mucous membranes are moist.     Pharynx: Oropharynx is clear.  Eyes:     General: No scleral icterus.    Extraocular Movements: Extraocular movements intact.     Pupils: Pupils are equal, round, and reactive to light.  Cardiovascular:  Rate and Rhythm: Normal rate and regular rhythm.     Heart sounds: Normal heart sounds. No murmur heard. No friction rub. No gallop.   Pulmonary:     Effort: Pulmonary effort is normal. No respiratory distress.     Breath sounds: Normal breath sounds. No stridor. No wheezing, rhonchi or rales.  Neurological:     Mental Status: He is alert and oriented to person, place, and time.     Cranial Nerves: No cranial nerve deficit.     Motor: No weakness.     Coordination: Coordination normal.     Gait: Gait normal.     Deep Tendon Reflexes: Reflexes normal.  Psychiatric:        Mood and Affect: Mood normal.        Behavior: Behavior normal.        Thought Content: Thought content normal.        Judgment: Judgment normal.     Results for orders  placed or performed during the hospital encounter of 07/20/20 (from the past 24 hour(s))  POC Urinalysis dipstick     Status: Abnormal   Collection Time: 07/20/20 12:48 PM  Result Value Ref Range   Glucose, UA NEGATIVE NEGATIVE mg/dL   Bilirubin Urine NEGATIVE NEGATIVE   Ketones, ur NEGATIVE NEGATIVE mg/dL   Specific Gravity, Urine 1.025 1.005 - 1.030   Hgb urine dipstick TRACE (A) NEGATIVE   pH 5.5 5.0 - 8.0   Protein, ur NEGATIVE NEGATIVE mg/dL   Urobilinogen, UA 0.2 0.0 - 1.0 mg/dL   Nitrite NEGATIVE NEGATIVE   Leukocytes,Ua NEGATIVE NEGATIVE    Recent Results (from the past 2160 hour(s))  POCT glycosylated hemoglobin (Hb A1C)     Status: Normal   Collection Time: 07/17/20 10:45 AM  Result Value Ref Range   Hemoglobin A1C 5.6 4.0 - 5.6 %   HbA1c POC (<> result, manual entry)     HbA1c, POC (prediabetic range)     HbA1c, POC (controlled diabetic range)    CMP14+EGFR     Status: None   Collection Time: 07/17/20 10:53 AM  Result Value Ref Range   Glucose 99 65 - 99 mg/dL   BUN 19 8 - 27 mg/dL   Creatinine, Ser 1.19 0.76 - 1.27 mg/dL   GFR calc non Af Amer 63 >59 mL/min/1.73   GFR calc Af Amer 73 >59 mL/min/1.73    Comment: **In accordance with recommendations from the NKF-ASN Task force,**   [facility] is in the process of updating its eGFR calculation to the   2021 CKD-EPI creatinine equation that estimates kidney function   without a race variable.    BUN/Creatinine Ratio 16 10 - 24   Sodium 143 134 - 144 mmol/L   Potassium 4.5 3.5 - 5.2 mmol/L   Chloride 106 96 - 106 mmol/L   CO2 22 20 - 29 mmol/L   Calcium 9.6 8.6 - 10.2 mg/dL   Total Protein 7.5 6.0 - 8.5 g/dL   Albumin 4.8 3.8 - 4.8 g/dL   Globulin, Total 2.7 1.5 - 4.5 g/dL   Albumin/Globulin Ratio 1.8 1.2 - 2.2   Bilirubin Total 0.3 0.0 - 1.2 mg/dL   Alkaline Phosphatase 96 44 - 121 IU/L   AST 18 0 - 40 IU/L   ALT 10 0 - 44 IU/L  T4, free     Status: None   Collection Time: 07/17/20 10:53 AM  Result Value Ref  Range   Free T4 1.39 0.82 - 1.77 ng/dL  TSH     Status: None   Collection Time: 07/17/20 10:53 AM  Result Value Ref Range   TSH 1.360 0.450 - 4.500 uIU/mL  CBC with Differential/Platelet     Status: Abnormal   Collection Time: 07/17/20 10:53 AM  Result Value Ref Range   WBC 8.5 3.4 - 10.8 x10E3/uL   RBC 4.34 4.14 - 5.80 x10E6/uL   Hemoglobin 14.0 13.0 - 17.7 g/dL   Hematocrit 42.3 37.5 - 51.0 %   MCV 98 (H) 79 - 97 fL   MCH 32.3 26.6 - 33.0 pg   MCHC 33.1 31.5 - 35.7 g/dL   RDW 13.0 11.6 - 15.4 %   Platelets 254 150 - 450 x10E3/uL   Neutrophils 74 Not Estab. %   Lymphs 17 Not Estab. %   Monocytes 7 Not Estab. %   Eos 1 Not Estab. %   Basos 1 Not Estab. %   Neutrophils Absolute 6.3 1.4 - 7.0 x10E3/uL   Lymphocytes Absolute 1.5 0.7 - 3.1 x10E3/uL   Monocytes Absolute 0.6 0.1 - 0.9 x10E3/uL   EOS (ABSOLUTE) 0.1 0.0 - 0.4 x10E3/uL   Basophils Absolute 0.1 0.0 - 0.2 x10E3/uL   Immature Granulocytes 0 Not Estab. %   Immature Grans (Abs) 0.0 0.0 - 0.1 x10E3/uL    ED ECG REPORT   Date: 07/20/2020  Rate: 71bpm  Rhythm: normal sinus rhythm  QRS Axis: normal  Intervals: normal  ST/T Wave abnormalities: nonspecific T wave changes  Conduction Disutrbances:none  Narrative Interpretation: Sinus rhythm at 71 bpm with nonspecific T wave flattening in lead aVL.  Completely unchanged from previous EKG in May 2021.  Old EKG Reviewed: unchanged  I have personally reviewed the EKG tracing and agree with the computerized printout as noted.   Assessment and Plan :   PDMP not reviewed this encounter.  1. Lightheadedness   2. Left-sided chest pain   3. Dysuria   4. Blurred vision, bilateral   5. Age-related cataract of both eyes, unspecified age-related cataract type     No sign of intracranial process such as stroke.  Neurologic exam unremarkable.  Cardiopulmonary exam also reassuring.  EKG without any acute findings and is completely unchanged.  No urinalysis.  Reviewed labs with  him that he had drawn a few days ago.  Counseled on the likelihood that his blurred vision is due to his bilateral cataracts.  Emphasized need for follow-up with an ophthalmologist for consultation.  Follow-up closely with PCP regarding his new medication as he reports side effects with. Counseled patient on potential for adverse effects with medications prescribed/recommended today, ER and return-to-clinic precautions discussed, patient verbalized understanding.    Jaynee Eagles, PA-C 07/20/20 1311

## 2020-07-20 NOTE — Telephone Encounter (Signed)
Patient name and DOB has been verified Patient was informed of lab results. Patient had no questions.  

## 2020-07-20 NOTE — Telephone Encounter (Signed)
-----   Message from Hoy Register, MD sent at 07/18/2020 12:36 PM EST ----- Please inform the patient that labs are normal. Thank you.

## 2020-07-20 NOTE — ED Triage Notes (Signed)
Pt reports left sided chest pain, lightheadedness and headache started 2 hrs ago. Pt think the symptoms can be related to a new medication he started for dementia 2 weeks ago. States he was diagnose with early signs of dementia 3 weeks ago.  Denies vision changes, vomitting, weakness, numbness or tingling in arms, abdominal pain.

## 2020-07-25 ENCOUNTER — Ambulatory Visit (HOSPITAL_COMMUNITY): Payer: Self-pay

## 2020-08-02 ENCOUNTER — Ambulatory Visit (HOSPITAL_COMMUNITY)
Admission: RE | Admit: 2020-08-02 | Discharge: 2020-08-02 | Disposition: A | Discharge status: Home / Self Care | Payer: Self-pay | Source: Ambulatory Visit | Attending: Family Medicine | Admitting: Family Medicine

## 2020-08-02 ENCOUNTER — Other Ambulatory Visit: Payer: Self-pay

## 2020-08-02 DIAGNOSIS — G309 Alzheimer's disease, unspecified: Secondary | ICD-10-CM | POA: Insufficient documentation

## 2020-08-04 ENCOUNTER — Other Ambulatory Visit: Payer: Self-pay | Admitting: Family Medicine

## 2020-08-04 DIAGNOSIS — I998 Other disorder of circulatory system: Secondary | ICD-10-CM

## 2020-08-06 MED FILL — ?MECLIZINE 25MG TAB: 25 | 15 days supply | Qty: 30 | Fill #1

## 2020-08-06 MED FILL — ?ROSUVASTATIN CALCIUM 40M T: 40 | 30 days supply | Qty: 30 | Fill #3

## 2020-08-06 MED FILL — METOPROLOL SUCCINATE ER 25: 25 | 30 days supply | Qty: 30 | Fill #0

## 2020-08-06 MED FILL — AMLODIPINE BESYLATE 5 MG TA: 5 | 30 days supply | Qty: 30 | Fill #3

## 2020-08-09 NOTE — Progress Notes (Signed)
MRI has been scheduled and pt has been informed

## 2020-08-20 ENCOUNTER — Other Ambulatory Visit: Payer: Self-pay

## 2020-08-20 ENCOUNTER — Ambulatory Visit (HOSPITAL_COMMUNITY)
Admission: RE | Admit: 2020-08-20 | Discharge: 2020-08-20 | Disposition: A | Discharge status: Home / Self Care | Payer: Self-pay | Source: Ambulatory Visit | Attending: Family Medicine | Admitting: Family Medicine

## 2020-08-20 DIAGNOSIS — I998 Other disorder of circulatory system: Secondary | ICD-10-CM | POA: Insufficient documentation

## 2020-08-21 ENCOUNTER — Telehealth: Payer: Self-pay | Admitting: Family Medicine

## 2020-08-21 ENCOUNTER — Other Ambulatory Visit: Payer: Self-pay | Admitting: Family Medicine

## 2020-08-21 ENCOUNTER — Encounter: Payer: Self-pay | Admitting: Neurology

## 2020-08-21 DIAGNOSIS — F0391 Unspecified dementia with behavioral disturbance: Secondary | ICD-10-CM

## 2020-08-21 NOTE — Telephone Encounter (Signed)
Copied from CRM [id number]. Topic: General - Other >> Aug 21, 2020  2:04 PM Wyonia Hough E wrote: Reason for CRM: Pt called and stated office called him about coming to pick up some paper/ please advise if this is the case before they come >> Aug 21, 2020  2:11 PM Wyonia Hough E wrote: Please call [name] to let her know if they can come pick up paperwork today at [phone]   Do not see where anyone called him for paperwork pickup. Please advise.

## 2020-08-22 NOTE — Telephone Encounter (Signed)
Thomas Ortiz was called and she was requesting the results of patients recent CT scan, she has been informed of patient results and informed of neurology appointment.

## 2020-08-30 ENCOUNTER — Other Ambulatory Visit: Payer: Self-pay | Admitting: Family Medicine

## 2020-08-30 DIAGNOSIS — R42 Dizziness and giddiness: Secondary | ICD-10-CM

## 2020-08-30 NOTE — Telephone Encounter (Signed)
Requested medication (s) are due for refill today: yes   Requested medication (s) are on the active medication list: yes   Last refill: 08/06/2020  Future visit scheduled: no  Notes to clinic:  this refill cannot be delegated    Requested Prescriptions  Pending Prescriptions Disp Refills   meclizine (ANTIVERT) 25 MG tablet [Pharmacy Med Name: MECLIZINE 25MG  TAB 25 Tablet] 30 tablet 1    Sig: Take 1 tablet (25 mg total) by mouth 2 (two) times daily as needed for dizziness.      Not Delegated - Gastroenterology: Antiemetics Failed - 08/30/2020 11:14 AM      Failed - This refill cannot be delegated      Passed - Valid encounter within last 6 months    Recent Outpatient Visits           1 month ago Blurry vision, bilateral   [facility] [place], [place], [place]   7 months ago Essential hypertension   [facility] [place], [place], [place]   10 months ago Vision changes   [facility] [place], [place], [place]   1 year ago Essential hypertension   [facility] [place], [place]   2 years ago Acute pain of right knee   [facility], [place]       Future Appointments             In 1 month Hoy Register, MD [facility]   In 3 months [facility], Katrinka Blazing, MD [facility]

## 2020-09-11 ENCOUNTER — Ambulatory Visit (HOSPITAL_COMMUNITY)
Admission: EM | Admit: 2020-09-11 | Discharge: 2020-09-11 | Disposition: A | Discharge status: Home / Self Care | Payer: No Payment, Other | Attending: Registered Nurse | Admitting: Registered Nurse

## 2020-09-11 ENCOUNTER — Other Ambulatory Visit: Payer: Self-pay

## 2020-09-11 ENCOUNTER — Encounter (HOSPITAL_COMMUNITY): Payer: Self-pay | Admitting: Registered Nurse

## 2020-09-11 DIAGNOSIS — R413 Other amnesia: Secondary | ICD-10-CM | POA: Insufficient documentation

## 2020-09-11 NOTE — BH Assessment (Signed)
Comprehensive Clinical Assessment (CCA) Note  09/11/2020 Thomas Ortiz [id number]  Per Assunta Found, NP, patient is psychiatrically cleared and recommended for discharge and follow up with outpatient provider.   Thomas Ortiz is a 65 year old male presenting to [facility] voluntarily with [employer] due to paranoia. Interpreter services used as patient is Spanish speaking. Patient reports coming to [facility] because "I'm a little out of my head and noticed I was different". Patient denies having SI/HI/AVH and does not feel paranoid. Patient denies feeling like someone is trying to harm him. Patient denies substance use. Patient reports living with his daughter, her two kids and boyfriend and his daughter mother. Patient denies history of psychiatric treatment. Per Chart review patient was diagnosed with dementia about a month ago. Patient is oriented to person and place, patient, able to recall his date of birth and knows he live in [place] but could not recall the city. Patient is calm, engaged and alert.   Collateral obtained from patient daughter Thomas Ortiz [phone] who reports that patient was diagnosed with early Alzheimer's last month and he was started on medications. Daughter reports that he was seen on 08/30/2020 and his next appointment is 11/16/2020 for follow up. Daughter does not have any safety concerns with patient returning home and plans on following up with neurology.               Chief Complaint: No chief complaint on file.  Visit Diagnosis: Memory changes    CCA Screening, Triage and Referral (STR)  Patient Reported Information How did you hear about [place]? Legal System  Referral name: No data recorded Referral phone number: No data recorded  Whom do you see for routine medical problems? No data recorded Practice/Facility Name: No data recorded Practice/Facility Phone Number: No data recorded Name of Contact: No data recorded Contact Number: No data recorded Contact Fax Number:  No data recorded Prescriber Name: No data recorded Prescriber Address (if known): No data recorded  What Is the Reason for Your Visit/Call Today? paranoia  How Long Has This Been Causing You Problems? <Week  What Do You Feel Would Help You the Most Today? No data recorded  Have You Recently Been in Any Inpatient Treatment (Hospital/Detox/Crisis Center/28-Day Program)? No data recorded Name/Location of Program/Hospital:No data recorded How Long Were You There? No data recorded When Were You Discharged? No data recorded  Have You Ever Received Services From [facility] Before? No data recorded Who Do You See at [facility]? No data recorded  Have You Recently Had Any Thoughts About Hurting Yourself? No  Are You Planning to Commit Suicide/Harm Yourself At This time? No   Have you Recently Had Thoughts About Hurting Someone [name]? No  Explanation: No data recorded  Have You Used Any Alcohol or Drugs in the Past 24 Hours? No  How Long Ago Did You Use Drugs or Alcohol? No data recorded What Did You Use and How Much? No data recorded  Do You Currently Have a Therapist/Psychiatrist? No data recorded Name of Therapist/Psychiatrist: No data recorded  Have You Been Recently Discharged From Any Office Practice or Programs? No data recorded Explanation of Discharge From Practice/Program: No data recorded    CCA Screening Triage Referral Assessment Type of Contact: No data recorded Is this Initial or Reassessment? No data recorded Date Telepsych consult ordered in [facility]:  No data recorded Time Telepsych consult ordered in [facility]:  No data recorded  Patient Reported Information Reviewed? No data recorded Patient Left Without Being Seen? No  data recorded Reason for Not Completing Assessment: No data recorded  Collateral Involvement: No data recorded  Does Patient Have a Court Appointed Legal Guardian? No data recorded Name and Contact of Legal Guardian: No data recorded If Minor and  Not Living with Parent(s), Who has Custody? No data recorded Is CPS involved or ever been involved? No data recorded Is APS involved or ever been involved? No data recorded  Patient Determined To Be At Risk for Harm To Self or Others Based on Review of Patient Reported Information or Presenting Complaint? No data recorded Method: No data recorded Availability of Means: No data recorded Intent: No data recorded Notification Required: No data recorded Additional Information for Danger to Others Potential: No data recorded Additional Comments for Danger to Others Potential: No data recorded Are There Guns or Other Weapons in Your Home? No data recorded Types of Guns/Weapons: No data recorded Are These Weapons Safely Secured?                            No data recorded Who Could Verify You Are Able To Have These Secured: No data recorded Do You Have any Outstanding Charges, Pending Court Dates, Parole/Probation? No data recorded Contacted To Inform of Risk of Harm To Self or Others: No data recorded  Location of Assessment: No data recorded  Does Patient Present under Involuntary Commitment? No data recorded IVC Papers Initial File Date: No data recorded  [place] of Residence: No data recorded  Patient Currently Receiving the Following Services: No data recorded  Determination of Need: Urgent (48 hours)   Options For Referral: [facility] Urgent Care     CCA Biopsychosocial Intake/Chief Complaint:  Paranoia  Current Symptoms/Problems: forgetful at times   Patient Reported Schizophrenia/Schizoaffective Diagnosis in Past: No   Strengths: UTA  Preferences: UTA  Abilities: UTA   Type of Services Patient Feels are Needed: none   Initial Clinical Notes/Concerns: No data recorded  Mental Health Symptoms Depression:  None   Duration of Depressive symptoms: No data recorded  Mania:  None   Anxiety:   None   Psychosis:  None   Duration of Psychotic symptoms: No data recorded   Trauma:  None   Obsessions:  None   Compulsions:  None   Inattention:  No data recorded  Hyperactivity/Impulsivity:  N/A   Oppositional/Defiant Behaviors:  None   Emotional Irregularity:  None   Other Mood/Personality Symptoms:  No data recorded   Mental Status Exam Appearance and self-care  Stature:  Small   Weight:  Average weight   Clothing:  Age-appropriate; Neat/clean   Grooming:  Normal   Cosmetic use:  None   Posture/gait:  Normal   Motor activity:  Not Remarkable   Sensorium  Attention:  Normal   Concentration:  Normal   Orientation:  Person; Place; Situation   Recall/memory:  Defective in Immediate   Affect and Mood  Affect:  Full Range   Mood:  No data recorded  Relating  Eye contact:  Normal   Facial expression:  Responsive   Attitude toward examiner:  Cooperative   Thought and Language  Speech flow: Clear and Coherent   Thought content:  Appropriate to Mood and Circumstances   Preoccupation:  None   Hallucinations:  None   Organization:  No data recorded  [employer] of Knowledge:  Impoverished by (Comment)   Intelligence:  Average   Abstraction:  Normal   Judgement:  Fair  Reality Testing:  Distorted   Insight:  Fair   Decision Making:  Normal   Social Functioning  Social Maturity:  Responsible   Social Judgement:  Normal   Stress  Stressors:  Illness   Coping Ability:  Normal   Skill Deficits:  None   Supports:  Family     Religion:    Leisure/Recreation:    Exercise/Diet:     CCA Employment/Education Employment/Work Situation: Employment / Work Situation Employment situation: Unemployed Patient's job has been impacted by current illness: No What is the longest time patient has a held a job?: UTA Where was the patient employed at that time?: UTA  Education: Education Is Patient Currently Attending School?: No   CCA Family/Childhood History Family and Relationship  History: Family history What is your sexual orientation?: UTA Has your sexual activity been affected by drugs, alcohol, medication, or emotional stress?: UTA Does patient have children?: Yes How is patient's relationship with their children?: lives with his daughter  Childhood History:  Childhood History Additional childhood history information: UTA Description of patient's relationship with caregiver when they were a child: UTA Patient's description of current relationship with people who raised him/her: UTA How were you disciplined when you got in trouble as a child/adolescent?: UTA  Child/Adolescent Assessment:     CCA Substance Use Alcohol/Drug Use: Alcohol / Drug Use Pain Medications: See MAR Prescriptions: See MAR Over the Counter: See MAR History of alcohol / drug use?: No history of alcohol / drug abuse                         ASAM's:  Six Dimensions of Multidimensional Assessment  Dimension 1:  Acute Intoxication and/or Withdrawal Potential:      Dimension 2:  Biomedical Conditions and Complications:      Dimension 3:  Emotional, Behavioral, or Cognitive Conditions and Complications:     Dimension 4:  Readiness to Change:     Dimension 5:  Relapse, Continued use, or Continued Problem Potential:     Dimension 6:  Recovery/Living Environment:     ASAM Severity Score:    ASAM Recommended Level of Treatment:     Substance use Disorder (SUD)    Recommendations for Services/Supports/Treatments: Recommendations for Services/Supports/Treatments Recommendations For Services/Supports/Treatments: Other (Comment)  DSM5 Diagnoses: Patient Active Problem List   Diagnosis Date Noted  . Hyperlipidemia 10/06/2018  . Osteoarthritis 01/11/2018  . Bradycardia 11/20/2017  . 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 Vertigo 10/12/2017  . Pain of molar 09/15/2016  . Age-related cataract of both eyes 09/15/2016  . Pain in both feet 03/26/2016  . 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 Polyarthritis 07/05/2015  . HTN (hypertension) 06/14/2015  . Joint pain 06/14/2015    Per Denice Bors Rankin, NP, patient is psychiatrically cleared and recommended for discharge and follow up with outpatient provider.   Kameela Leipold Shirlee More, [facility]

## 2020-09-11 NOTE — ED Provider Notes (Signed)
[facility] Medical Screening Exam  Patient Name: Thomas Ortiz MRN: [id number] Date of Evaluation: 09/11/20 Chief Complaint: Chief Complaint/Presenting Problem: Paranoia Diagnosis:  Final diagnoses:  Memory changes    History of Present illness: Thomas Ortiz is a 65 y.o. male patient presented to [facility] as a walk in via Patent examiner and daughter with complaints that patient had called the police because he was afraid someone was trying to harm him  Thomas Ortiz, 65 y.o., male patient seen face to face by this provider, consulted with Dr. Bronwen Betters; and chart reviewed on 09/11/20.  On evaluation Thomas Ortiz asked why he was brought in and her stated "because my daughter thinks something is wrong with my head, that I seem different."  Patient denies any thoughts of wanting to harm/kill himself or anyone else.  Patient also denies auditory hallucinations.  Patient denies that he is having any paranoia such as thoughts that someone is trying to kill/harm him, thoughts that someone is watching or reading his thoughts.  Stats he is having no paranoia.  Patient denies alcohol and drug use.  Patient states he is currently living in his daughters home with her mother, boyfriend, and 2 kids.  Patient denies prior psychiatric history (no outpatient, psychiatric admission, prior suicide attempt or psychotropic medications).  Patient asked is aware the he was brought in for an evaluation but doesn't know the name of the building.  He states he knows where he is but unable to state the name of the city until he looked at the mail in his pocket that had his address on it.  Patient was able to give the correct age and DOB. During evaluation Thomas Ortiz is sitting up right in chair in no acute distress.  He is alert, oriented x 4, calm and cooperative.  His mood is euthymic with congruent affect.  He does not appear to be responding to internal/external stimuli or delusional thoughts.  Patient denies  suicidal/self-harm/homicidal ideation, psychosis, and paranoia.  Patient gave permission to speak to his daughter for collateral information.     Daughter had concerns related to patients memory and wanted to know if he was getting worse or better.  Informed that it would be better to follow up with neurologist that has been taking care of her father and to keep the scheduled appointment.  States she will be here to pick up her father.    Psychiatric Specialty Exam  Presentation  General Appearance:Appropriate for Environment; Casual  Eye Contact:Good  Speech:Clear and Coherent; Normal Rate  Speech Volume:Normal  Handedness:Right   Mood and Affect  Mood:Euthymic  Affect:Appropriate; Congruent   Thought Process  Thought Processes:Coherent  Descriptions of Associations:Intact  Orientation:Full (Time, Place and Person)  Thought Content:Logical  Diagnosis of Schizophrenia or Schizoaffective disorder in past: No   Hallucinations:None  Ideas of Reference:None  Suicidal Thoughts:No  Homicidal Thoughts:No   Sensorium  Memory:Immediate Fair; Recent Fair  Judgment:Fair  Insight:Present   Executive Functions  Concentration:Good  Attention Span:Good  Recall:Fair  Fund of Knowledge:Fair  Language:Poor (Interpreter used for assessment.  Primary language is Spanish)   Psychomotor Activity  Psychomotor Activity:Normal   Assets  Assets:Communication Skills; Desire for Improvement; Financial Resources/Insurance; Housing; Resilience; Social Support   Sleep  Sleep:Good  Number of hours: No data recorded  Nutritional Assessment (For OBS and [facility] admissions only) Has the patient had a weight loss or gain of 10 pounds or more in the last 3 months?: No Has the patient had a  decrease in food intake/or appetite?: No Does the patient have dental problems?: No Does the patient have eating habits or behaviors that may be indicators of an eating disorder including  binging or inducing vomiting?: No Has the patient recently lost weight without trying?: No Has the patient been eating poorly because of a decreased appetite?: No Malnutrition Screening Tool Score: 0    Physical Exam: Physical Exam Vitals and nursing note reviewed. Exam conducted with a chaperone present.  Constitutional:      General: He is not in acute distress.    Appearance: Normal appearance. He is not ill-appearing.  HENT:     Head: Normocephalic.  Eyes:     Pupils: Pupils are equal, round, and reactive to light.  Cardiovascular:     Rate and Rhythm: Normal rate.  Pulmonary:     Effort: Pulmonary effort is normal.  Musculoskeletal:        General: Normal range of motion.     Cervical back: Normal range of motion.  Skin:    General: Skin is warm and dry.  Neurological:     Mental Status: He is alert and oriented to person, place, and time.  Psychiatric:        Attention and Perception: Attention and perception normal. He does not perceive auditory or visual hallucinations.        Mood and Affect: Mood and affect normal.        Speech: Speech normal.        Behavior: Behavior normal. Behavior is cooperative.        Thought Content: Thought content normal. Thought content is not paranoid or delusional. Thought content does not include homicidal or suicidal ideation.        Cognition and Memory: Cognition normal. Memory is impaired.        Judgment: Judgment normal.    Review of Systems  Constitutional: Negative.   HENT: Negative.   Eyes: Negative.   Respiratory: Negative.   Cardiovascular: Negative.   Gastrointestinal: Negative.   Genitourinary: Negative.   Musculoskeletal: Negative.   Skin: Negative.   Neurological: Negative.  Negative for speech change and seizures.       Patients daughter reports recent diagnosis of Alzheimer; but has an appointment with neurologist  Endo/Heme/Allergies: Negative.    Blood pressure (!) 141/95, pulse 67, temperature 99 F  (37.2 C), temperature source Temporal, resp. rate 16, SpO2 99 %. There is no height or weight on file to calculate BMI.  Musculoskeletal: Strength & Muscle Tone: within normal limits Gait & Station: normal Patient leans: N/A   [facility] MSE Discharge Disposition for Follow up and Recommendations: Based on my evaluation the patient does not appear to have an emergency medical condition and can be discharged with resources and follow up care in outpatient services for Medication Management     Discharge Instructions     Keep scheduled appointment with neurologist.       Assunta Found, NP 09/11/2020, 5:25 PM

## 2020-09-11 NOTE — Discharge Instructions (Addendum)
Keep scheduled appointment with neurologist.

## 2020-09-11 NOTE — BH Assessment (Signed)
TTS triage: Patient is a 65 year old male presenting voluntarily to [facility] via [employer]. Interpreter service used as patient is spanish speaking only. Patient expressed that there is a man in his house that is not good. Per interpreter patient's speech is disorganized. He denies SI/HI/AVH.  [employer] officer who brought patient in stated patient called police because he was afraid someone was trying to harm him. He also expressed paranoia that people are trying to poison him and there was a man outside with a gun. Daughter states patient has dementia.   Daughter- [name] [phone].  Patient is URGENT

## 2020-09-11 NOTE — Progress Notes (Signed)
Thomas Ortiz received his AVS, questions answered and he was escorted to retrieve his personal belongings.

## 2020-09-12 ENCOUNTER — Telehealth (HOSPITAL_COMMUNITY): Payer: Self-pay | Admitting: Family Medicine

## 2020-09-12 NOTE — BH Assessment (Signed)
Care Management - Follow Up [facility] Discharges   Writer attempted to make contact with patient today and was unsuccessful.  Writer was able to leave a HIPPA compliant voice message and will await callback.   

## 2020-09-15 ENCOUNTER — Other Ambulatory Visit: Payer: Self-pay

## 2020-10-15 ENCOUNTER — Ambulatory Visit: Payer: Self-pay | Admitting: Family Medicine

## 2020-11-05 ENCOUNTER — Other Ambulatory Visit: Payer: Self-pay

## 2020-11-05 ENCOUNTER — Encounter: Payer: Self-pay | Admitting: *Deleted

## 2020-11-05 MED FILL — Rosuvastatin Calcium Tab 40 MG: ORAL | 30 days supply | Qty: 30 | Fill #0 | Status: CN

## 2020-11-05 MED FILL — Metoprolol Succinate Tab ER 24HR 25 MG (Tartrate Equiv): ORAL | 30 days supply | Qty: 30 | Fill #0 | Status: CN

## 2020-11-05 MED FILL — Amlodipine Besylate Tab 5 MG (Base Equivalent): ORAL | 30 days supply | Qty: 30 | Fill #0 | Status: CN

## 2020-11-07 NOTE — Progress Notes (Signed)
Assessment/Plan:   65 y.o. 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 Unable to perform MMSE, but has obvious issues with memory storage, visual object recognition   Will proceed with further workup.   Recommendations are as follows    Spinal fluid tap to evaluate for inflammation, infection or other etiologies for memory loss   Increase Memantine to 10 mg twice a day   Start Seroquel 20 mg qhs for agitation  . Discussed safety both in and out of the home.  . Discussed the importance of regular daily schedule to maintain brain function.  . Continue to monitor mood with PCP. If a danger to himself or others, recommended to go to ER for behavioral admission  . Stay active exercising  at least 30 minutes at least 3 times a week.  . Naps should be scheduled and should be no longer than 60 minutes and should not occur after 2 PM.  .  . Follow up once results above are available    Subjective:    The patient is seen in neurologic consultation at the request of Hoy Register, MD for the evaluation of memory.  The patient is accompanied by his son Jonetta Speak who supplements the history.  The patient is a 65 y.o. year old male with a history of remote alcohol abuse, hypertension, hyperlipidemia who has had memory issues for about 5 years, since falling from a ladder and hitting his head.  During a visit with PCP son reported father having worsening memory loss. MRI was abnormal suspicious for oderately age advanced cerebral white matter disease, rule out cerebral amyloid disease in this case, versus demyelinating disease, vasculitis, or  prior Infection/inflammation.  Unable to perform MMSE, but has obvious issues with  memory storage, visual object recognition. He was placed on Memantine 5 mg bid by PCP. He is tolerating med well and referred here for further workup . Currently, he lives with his daughter, whom he thinks wants to do harm on him.  Son reports that he has become increasingly paranoid about it, afraid to be with her family.  He calls his son daily about it, to the point that he decided to put a camera in the house, especially at night in his bedroom.  Videos did not show any abnormal behavior towards the patient.  He hallucinates about this, stating that there is  "dog poop to put it in his behind inside of diapers, so that he wakes up with stools ".  When he talks about it, she becomes very agitated and anxious.  These paranoia has gotten to the point where he placed the chair at the door to "stop "the door and from coming in, and he wants to take "they would not attack him ".  Obviously, videos did not show any force applied" on the patient, per son [place] report.  He has had some suicidal ideations, including this morning, because he is very bothered by his lack of memory.  Jonetta Speak states that he will be moving with him shortly, so that he can be with his wife and children.  He only sleeps 2 hours at night due to the above.  He dresses and bases with assistance.  Daughter and son took over the finances.  His appetite is good, denies trouble swallowing.  He no longer cooks or drives.  He has had cataracts, but there is no double visions.  Denies dizziness at this time, focal numbness, or tingling.  Denies unilateral weakness or tremors.  Denies urine incontinence or retention.  No constipation or diarrhea.  No history of OSA.     CT scan 08/02/20   IMPRESSION: 1. No acute intracranial abnormality. 2. White matter hypo densities greatest in the occipital and posterior parietal lobes, most commonly small vessel ischemia. Given the posterior predominance, however, MRI may be helpful for  further Characterization.   MRI brain without contrast 08/20/20  IMPRESSION: 1. No acute intracranial abnormality. 2. Moderately age advanced cerebral white matter disease,nonspecific. While chronic small vessel ischemia is the most common etiology, the distribution of white matter disease, associated scattered chronic micro hemorrhages, and history of dementia raise the possibility of cerebral amyloid disease in this case. Other considerations include demyelinating disease, vasculitis, and priorInfection/inflammation.  Hb A1C 07/17/20 5.6  TSH  07/17/20 1.360  With T4 1.39  Normal     No Known Allergies  Current Outpatient Medications  Medication Instructions  . amLODipine (NORVASC) 5 MG tablet TAKE 1 TABLET (5 MG TOTAL) BY MOUTH DAILY.  . meclizine (ANTIVERT) 25 MG tablet TAKE 1 TABLET (25 MG TOTAL) BY MOUTH 2 (TWO) TIMES DAILY AS NEEDED FOR DIZZINESS.  . memantine (NAMENDA) 10 mg, Oral, 2 times daily  . metoprolol succinate (TOPROL-XL) 25 MG 24 hr tablet TAKE 1 TABLET (25 MG TOTAL) BY MOUTH DAILY.  [name] QUEtiapine (SEROQUEL) 25 MG tablet Take 1 tablet at night for agitation, paranoia  . rosuvastatin (CRESTOR) 40 MG tablet TAKE 1 TABLET (40 MG TOTAL) BY MOUTH DAILY.     VITALS:   Vitals:   11/08/20 1038  BP: (!) 147/83  Pulse: (!) 55  SpO2: 97%  Weight: 143 lb (64.9 kg)  Height: 5\' 3"  (1.6 m)   Depression screen [facility] 2/9 07/17/2020 10/26/2019 01/11/2018 10/12/2017 09/02/2017  Decreased Interest 0 2 0 1 0  Down, Depressed, Hopeless 0 2 0 1 0  PHQ - 2 Score 0 4 0 2 0  Altered sleeping 0 0 0 0 -  Tired, decreased energy 0 0 1 1 -  Change in appetite 0 0 0 0 -  Feeling bad or failure about yourself  0 1 0 1 -  Trouble concentrating 0 2 0 0 -  Moving slowly or fidgety/restless 0 2 1 1  -  Suicidal thoughts 0 0 0 0 -  PHQ-9 Score 0 9 2 5  -  Difficult doing work/chores - - - - -  Some recent data might be hidden    HEENT:  Normocephalic, atraumatic. The mucous membranes are moist.  The superficial temporal arteries are without ropiness or tenderness. Cardiovascular: Regular rate and rhythm. Lungs: Clear to auscultation bilaterally. Neck: There are no carotid bruits noted bilaterally.  NEUROLOGICAL:  Orientation:  No flowsheet data found. knows name, he is 65 years old, but knows is spring, May, not year. Trouble recognizing objects and delayed recall.  Cranial nerves: There is good facial symmetry. Extraocular muscles are intact and visual fields are full to confrontational testing. Speech is fluent and clear. Soft palate rises symmetrically and there is no tongue deviation. Hearing is intact to conversational tone. Tone: Tone is good throughout. Sensation: Sensation is intact to light touch and pinprick throughout. Vibration is intact at the bilateral big toe. There is no extinction with double simultaneous stimulation. There is no sensory dermatomal level identified. Coordination:  The patient has no difficulty with RAM's or FNF bilaterally. Normal finger to nose on the R, on the left finger to nose repeatedly to the L cheek.  Motor: Strength is 5/5 in the bilateral upper and lower extremities. There is no pronator drift.  There are no fasciculations noted. DTR's: Deep tendon reflexes are 2/4 at the bilateral biceps, triceps, brachioradialis, patella and achilles.  Plantar responses are downgoing bilaterally. Gait and Station: The patient is able to ambulate with some difficulty, "knee hurts". Unable to heel toe The patient is able to ambulate in a tandem fashion. The patient is able to stand in the Romberg position.    Total time spent on today's visit was   60 minutes, including both face-to-face time and nonface-to-face time.  Time included that spent on review of records (prior notes available to me/labs/imaging if pertinent), discussing treatment and goals, answering patient's questions and coordinating care.  Cc:  Hoy Register, MD  Marlowe Kays 11/08/2020 3:19  PM

## 2020-11-07 NOTE — Patient Instructions (Addendum)
It was a pleasure to see you today at our office.   Recommendations:   Follow up when results of the spinal tap are available   Increase  Memantine to 10  Mg twice a day   [place] financial  Assistance to help with bills   Seroquel 25 mg every night   Will arrange for spinal tap to determine the cause of his dementia   Recommend 24/7 sitter as he clearly needs to be observed at all times. Agree to have him moved to your home where he feels safe and arranged a full time sitter or a memory care center, nursing home, so he is not alone during the day       RECOMMENDATIONS FOR ALL PATIENTS WITH MEMORY PROBLEMS: 1. Continue to exercise (Recommend 30 minutes of walking everyday, or 3 hours every week) 2. Increase social interactions - continue going to [place] and enjoy social gatherings with friends and family 3. Eat healthy, avoid fried foods and eat more fruits and vegetables 4. Maintain adequate blood pressure, blood sugar, and blood cholesterol level. Reducing the risk of stroke and cardiovascular disease also helps promoting better memory. 5. Avoid stressful situations. Live a simple life and avoid aggravations. Organize your time and prepare for the next day in anticipation. 6. Sleep well, avoid any interruptions of sleep and avoid any distractions in the bedroom that may interfere with adequate sleep quality 7. Avoid sugar, avoid sweets as there is a strong link between excessive sugar intake, diabetes, and cognitive impairment We discussed the Mediterranean diet, which has been shown to help patients reduce the risk of progressive memory disorders and reduces cardiovascular risk. This includes eating fish, eat fruits and green leafy vegetables, nuts like almonds and hazelnuts, walnuts, and also use olive oil. Avoid fast foods and fried foods as much as possible. Avoid sweets and sugar as sugar use has been linked to worsening of memory function.  There is always a concern of  gradual progression of memory problems. If this is the case, then we may need to adjust level of care according to patient needs. Support, both to the patient and caregiver, should then be put into place.    The Alzheimer's Association is here all day, every day for people facing Alzheimer's disease through our free 24/7 Helpline: [phone])320-4926. The Helpline provides reliable information and support to all those who need assistance, such as individuals living with memory loss, Alzheimer's or other dementia, caregivers, health care professionals and the public.  Our highly trained and knowledgeable staff can help you with: . Understanding memory loss, dementia and Alzheimer's  . Medications and other treatment options  . General information about aging and brain health  . Skills to provide quality care and to find the best care from professionals  . Legal, financial and living-arrangement decisions Our Helpline also features: . Confidential care consultation provided by master's level clinicians who can help with decision-making support, crisis assistance and education on issues families face every day  . Help in a caller's preferred language using our translation service that features more than 200 languages and dialects  . Referrals to local community programs, services and ongoing support     FALL PRECAUTIONS: Be cautious when walking. Scan the area for obstacles that may increase the risk of trips and falls. When getting up in the mornings, sit up at the edge of the bed for a few minutes before getting out of bed. Consider elevating the bed at the head end  to avoid drop of blood pressure when getting up. Walk always in a well-lit room (use night lights in the walls). Avoid area rugs or power cords from appliances in the middle of the walkways. Use a walker or a cane if necessary and consider physical therapy for balance exercise. Get your eyesight checked regularly.  FINANCIAL OVERSIGHT:  Supervision, especially oversight when making financial decisions or transactions is also recommended.  HOME SAFETY: Consider the safety of the kitchen when operating appliances like stoves, microwave oven, and blender. Consider having supervision and share cooking responsibilities until no longer able to participate in those. Accidents with firearms and other hazards in the house should be identified and addressed as well.   ABILITY TO BE LEFT ALONE: If patient is unable to contact 911 operator, consider using LifeLine, or when the need is there, arrange for someone to stay with patients. Smoking is a fire hazard, consider supervision or cessation. Risk of wandering should be assessed by caregiver and if detected at any point, supervision and safe proof recommendations should be instituted.  MEDICATION SUPERVISION: Inability to self-administer medication needs to be constantly addressed. Implement a mechanism to ensure safe administration of the medications.       Mediterranean Diet A Mediterranean diet refers to food and lifestyle choices that are based on the traditions of countries located on the [employer]. This way of eating has been shown to help prevent certain conditions and improve outcomes for people who have chronic diseases, like kidney disease and heart disease. What are tips for following this plan? Lifestyle   Cook and eat meals together with your family, when possible.  Drink enough fluid to keep your urine clear or pale yellow.  Be physically active every day. This includes:  Aerobic exercise like running or swimming.  Leisure activities like gardening, walking, or housework.  Get 7-8 hours of sleep each night.  If recommended by your health care provider, drink red wine in moderation. This means 1 glass a day for nonpregnant women and 2 glasses a day for men. A glass of wine equals 5 oz (150 mL). Reading food labels   Check the serving size of packaged foods.  For foods such as rice and pasta, the serving size refers to the amount of cooked product, not dry.  Check the total fat in packaged foods. Avoid foods that have saturated fat or trans fats.  Check the ingredients list for added sugars, such as corn syrup. Shopping   At the grocery store, buy most of your food from the areas near the walls of the store. This includes:  Fresh fruits and vegetables (produce).  Grains, beans, nuts, and seeds. Some of these may be available in unpackaged forms or large amounts (in bulk).  Fresh seafood.  Poultry and eggs.  Low-fat dairy products.  Buy whole ingredients instead of prepackaged foods.  Buy fresh fruits and vegetables in-season from local farmers markets.  Buy frozen fruits and vegetables in resealable bags.  If you do not have access to quality fresh seafood, buy precooked frozen shrimp or canned fish, such as tuna, salmon, or sardines.  Buy small amounts of raw or cooked vegetables, salads, or olives from the deli or salad bar at your store.  Stock your pantry so you always have certain foods on hand, such as olive oil, canned tuna, canned tomatoes, rice, pasta, and beans. Cooking   Cook foods with extra-virgin olive oil instead of using butter or other vegetable oils.  Have meat  as a side dish, and have vegetables or grains as your main dish. This means having meat in small portions or adding small amounts of meat to foods like pasta or stew.  Use beans or vegetables instead of meat in common dishes like chili or lasagna.  Experiment with different cooking methods. Try roasting or broiling vegetables instead of steaming or sauteing them.  Add frozen vegetables to soups, stews, pasta, or rice.  Add nuts or seeds for added healthy fat at each meal. You can add these to yogurt, salads, or vegetable dishes.  Marinate fish or vegetables using olive oil, lemon juice, garlic, and fresh herbs. Meal planning   Plan to eat 1  vegetarian meal one day each week. Try to work up to 2 vegetarian meals, if possible.  Eat seafood 2 or more times a week.  Have healthy snacks readily available, such as:  Vegetable sticks with hummus.  Greek yogurt.  Fruit and nut trail mix.  Eat balanced meals throughout the week. This includes:  Fruit: 2-3 servings a day  Vegetables: 4-5 servings a day  Low-fat dairy: 2 servings a day  Fish, poultry, or lean meat: 1 serving a day  Beans and legumes: 2 or more servings a week  Nuts and seeds: 1-2 servings a day  Whole grains: 6-8 servings a day  Extra-virgin olive oil: 3-4 servings a day  Limit red meat and sweets to only a few servings a month What are my food choices?  Mediterranean diet  Recommended  Grains: Whole-grain pasta. Brown rice. Bulgar wheat. Polenta. Couscous. Whole-wheat bread. [place] Cobbatmeal. Quinoa.  Vegetables: Artichokes. Beets. Broccoli. Cabbage. Carrots. Eggplant. Green beans. Chard. Kale. Spinach. Onions. Leeks. Peas. Squash. Tomatoes. Peppers. Radishes.  Fruits: Apples. Apricots. Avocado. Berries. Bananas. Cherries. Dates. Figs. Grapes. Lemons. Melon. Oranges. Peaches. Plums. Pomegranate.  Meats and other protein foods: Beans. Almonds. Sunflower seeds. Pine nuts. Peanuts. Cod. Salmon. Scallops. Shrimp. Tuna. Tilapia. Clams. Oysters. Eggs.  Dairy: Low-fat milk. Cheese. Greek yogurt.  Beverages: Water. Red wine. Herbal tea.  Fats and oils: Extra virgin olive oil. Avocado oil. Grape seed oil.  Sweets and desserts: [place] yogurt with honey. Baked apples. Poached pears. Trail mix.  Seasoning and other foods: Basil. Cilantro. Coriander. Cumin. Mint. Parsley. Sage. Rosemary. Tarragon. Garlic. Oregano. Thyme. Pepper. Balsalmic vinegar. Tahini. Hummus. Tomato sauce. Olives. Mushrooms.  Limit these  Grains: Prepackaged pasta or rice dishes. Prepackaged cereal with added sugar.  Vegetables: Deep fried potatoes (french fries).  Fruits: Fruit canned  in syrup.  Meats and other protein foods: Beef. Pork. Lamb. Poultry with skin. Hot dogs. Tomasa BlaseBacon.  Dairy: Ice cream. Sour cream. Whole milk.  Beverages: Juice. Sugar-sweetened soft drinks. Beer. Liquor and spirits.  Fats and oils: Butter. Canola oil. Vegetable oil. Beef fat (tallow). Lard.  Sweets and desserts: Cookies. Cakes. Pies. Candy.  Seasoning and other foods: Mayonnaise. Premade sauces and marinades.  The items listed may not be a complete list. Talk with your dietitian about what dietary choices are right for you. Summary  The Mediterranean diet includes both food and lifestyle choices.  Eat a variety of fresh fruits and vegetables, beans, nuts, seeds, and whole grains.  Limit the amount of red meat and sweets that you eat.  Talk with your health care provider about whether it is safe for you to drink red wine in moderation. This means 1 glass a day for nonpregnant women and 2 glasses a day for men. A glass of wine equals 5 oz (150 mL). This information  is not intended to replace advice given to you by your health care provider. Make sure you discuss any questions you have with your health care provider. Document Released: 01/24/2016 Document Revised: 02/26/2016 Document Reviewed: 01/24/2016 Elsevier Interactive Patient Education  2017 [employer].

## 2020-11-08 ENCOUNTER — Encounter: Payer: Self-pay | Admitting: Physician Assistant

## 2020-11-08 ENCOUNTER — Other Ambulatory Visit: Payer: Self-pay

## 2020-11-08 ENCOUNTER — Ambulatory Visit (INDEPENDENT_AMBULATORY_CARE_PROVIDER_SITE_OTHER): Payer: Self-pay | Admitting: Physician Assistant

## 2020-11-08 DIAGNOSIS — F0391 Unspecified dementia with behavioral disturbance: Secondary | ICD-10-CM

## 2020-11-08 MED ORDER — QUETIAPINE FUMARATE 25 MG PO TABS: ORAL_TABLET | ORAL | 3 refills | Status: DC

## 2020-11-08 MED ORDER — MEMANTINE HCL 10 MG PO TABS: 10.0000 mg | ORAL_TABLET | Freq: Two times a day (BID) | ORAL | 11 refills | Status: DC

## 2020-11-08 MED ORDER — QUETIAPINE FUMARATE 25 MG PO TABS
ORAL_TABLET | ORAL | 3 refills | Status: DC
  Filled 2020-11-08: qty 30, 30d supply, fill #0

## 2020-11-14 ENCOUNTER — Other Ambulatory Visit: Payer: Self-pay

## 2020-11-16 ENCOUNTER — Ambulatory Visit: Payer: Self-pay | Admitting: Neurology

## 2020-11-26 ENCOUNTER — Encounter (HOSPITAL_COMMUNITY): Payer: Self-pay | Admitting: *Deleted

## 2020-11-26 ENCOUNTER — Emergency Department (HOSPITAL_COMMUNITY)
Admission: EM | Admit: 2020-11-26 | Discharge: 2020-11-27 | Disposition: A | Discharge status: Home / Self Care | Payer: Self-pay | Attending: Student | Admitting: Student

## 2020-11-26 DIAGNOSIS — K6289 Other specified diseases of anus and rectum: Secondary | ICD-10-CM | POA: Insufficient documentation

## 2020-11-26 DIAGNOSIS — Z20822 Contact with and (suspected) exposure to covid-19: Secondary | ICD-10-CM | POA: Insufficient documentation

## 2020-11-26 DIAGNOSIS — Z5321 Procedure and treatment not carried out due to patient leaving prior to being seen by health care provider: Secondary | ICD-10-CM | POA: Insufficient documentation

## 2020-11-26 DIAGNOSIS — T7421XA Adult sexual abuse, confirmed, initial encounter: Secondary | ICD-10-CM | POA: Insufficient documentation

## 2020-11-26 MED ORDER — ACETAMINOPHEN 325 MG PO TABS
650.0000 mg | ORAL_TABLET | Freq: Once | ORAL | Status: AC
  Administered 2020-11-26: 650 mg via ORAL
  Filled 2020-11-26: qty 2
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
## 2020-11-27 ENCOUNTER — Emergency Department (HOSPITAL_COMMUNITY): Payer: Self-pay

## 2020-11-27 LAB — URINALYSIS, ROUTINE W REFLEX MICROSCOPIC
Bacteria, UA: NONE SEEN
Bilirubin Urine: NEGATIVE
Glucose, UA: NEGATIVE mg/dL
Hgb urine dipstick: NEGATIVE
Ketones, ur: 5 mg/dL — AB
Leukocytes,Ua: NEGATIVE
Nitrite: NEGATIVE
Protein, ur: 100 mg/dL — AB
Specific Gravity, Urine: 1.02 (ref 1.005–1.030)
pH: 5 (ref 5.0–8.0)

## 2020-11-27 LAB — CBC WITH DIFFERENTIAL/PLATELET
Abs Immature Granulocytes: 0.07 10*3/uL (ref 0.00–0.07)
Basophils Absolute: 0.1 10*3/uL (ref 0.0–0.1)
Basophils Relative: 0 %
Eosinophils Absolute: 0 10*3/uL (ref 0.0–0.5)
Eosinophils Relative: 0 %
HCT: 43.4 % (ref 39.0–52.0)
Hemoglobin: 14.5 g/dL (ref 13.0–17.0)
Immature Granulocytes: 1 %
Lymphocytes Relative: 4 %
Lymphs Abs: 0.7 10*3/uL (ref 0.7–4.0)
MCH: 32.6 pg (ref 26.0–34.0)
MCHC: 33.4 g/dL (ref 30.0–36.0)
MCV: 97.5 fL (ref 80.0–100.0)
Monocytes Absolute: 1.3 10*3/uL — ABNORMAL HIGH (ref 0.1–1.0)
Monocytes Relative: 8 %
Neutro Abs: 13.3 10*3/uL — ABNORMAL HIGH (ref 1.7–7.7)
Neutrophils Relative %: 87 %
Platelets: 240 10*3/uL (ref 150–400)
RBC: 4.45 MIL/uL (ref 4.22–5.81)
RDW: 13.2 % (ref 11.5–15.5)
WBC: 15.3 10*3/uL — ABNORMAL HIGH (ref 4.0–10.5)
nRBC: 0 % (ref 0.0–0.2)

## 2020-11-27 LAB — RESP PANEL BY RT-PCR (FLU A&B, COVID) ARPGX2
Influenza A by PCR: NEGATIVE
Influenza B by PCR: NEGATIVE
SARS Coronavirus 2 by RT PCR: NEGATIVE

## 2020-11-27 LAB — COMPREHENSIVE METABOLIC PANEL
ALT: 9 U/L (ref 0–44)
AST: 14 U/L — ABNORMAL LOW (ref 15–41)
Albumin: 4.3 g/dL (ref 3.5–5.0)
Alkaline Phosphatase: 91 U/L (ref 38–126)
Anion gap: 10 (ref 5–15)
BUN: 15 mg/dL (ref 8–23)
CO2: 26 mmol/L (ref 22–32)
Calcium: 9.5 mg/dL (ref 8.9–10.3)
Chloride: 102 mmol/L (ref 98–111)
Creatinine, Ser: 1.44 mg/dL — ABNORMAL HIGH (ref 0.61–1.24)
GFR, Estimated: 54 mL/min — ABNORMAL LOW (ref >60)
Glucose, Bld: 108 mg/dL — ABNORMAL HIGH (ref 70–99)
Potassium: 3.4 mmol/L — ABNORMAL LOW (ref 3.5–5.1)
Sodium: 138 mmol/L (ref 135–145)
Total Bilirubin: 0.8 mg/dL (ref 0.3–1.2)
Total Protein: 7.7 g/dL (ref 6.5–8.1)

## 2020-11-27 NOTE — ED Notes (Signed)
Pt didn't answered when called for vitals  

## 2020-11-27 NOTE — ED Notes (Signed)
informed pt wife that pt has left the ER. Son will track him to see his location. Informed her to give [place] call back

## 2020-12-12 ENCOUNTER — Ambulatory Visit: Payer: Self-pay | Admitting: Physician Assistant

## 2020-12-13 ENCOUNTER — Ambulatory Visit (INDEPENDENT_AMBULATORY_CARE_PROVIDER_SITE_OTHER): Payer: Self-pay | Admitting: Interventional Cardiology

## 2020-12-13 DIAGNOSIS — I209 Angina pectoris, unspecified: Secondary | ICD-10-CM

## 2020-12-13 DIAGNOSIS — I10 Essential (primary) hypertension: Secondary | ICD-10-CM

## 2020-12-13 DIAGNOSIS — E785 Hyperlipidemia, unspecified: Secondary | ICD-10-CM

## 2020-12-13 DIAGNOSIS — I6523 Occlusion and stenosis of bilateral carotid arteries: Secondary | ICD-10-CM

## 2020-12-13 DIAGNOSIS — I2510 Atherosclerotic heart disease of native coronary artery without angina pectoris: Secondary | ICD-10-CM

## 2020-12-13 NOTE — Progress Notes (Deleted)
No show

## 2021-03-13 ENCOUNTER — Other Ambulatory Visit: Payer: Self-pay

## 2021-03-13 ENCOUNTER — Emergency Department (HOSPITAL_COMMUNITY): Payer: Self-pay

## 2021-03-13 ENCOUNTER — Emergency Department (HOSPITAL_COMMUNITY)
Admission: EM | Admit: 2021-03-13 | Discharge: 2021-03-25 | Disposition: A | Discharge status: Home / Self Care | Payer: Self-pay | Attending: Emergency Medicine | Admitting: Emergency Medicine

## 2021-03-13 ENCOUNTER — Emergency Department (HOSPITAL_COMMUNITY): Admission: AD | Admit: 2021-03-13 | Payer: Self-pay | Source: Home / Self Care | Admitting: Obstetrics & Gynecology

## 2021-03-13 DIAGNOSIS — I10 Essential (primary) hypertension: Secondary | ICD-10-CM | POA: Insufficient documentation

## 2021-03-13 DIAGNOSIS — S1191XA Laceration without foreign body of unspecified part of neck, initial encounter: Secondary | ICD-10-CM

## 2021-03-13 DIAGNOSIS — Z79899 Other long term (current) drug therapy: Secondary | ICD-10-CM | POA: Insufficient documentation

## 2021-03-13 DIAGNOSIS — Y92009 Unspecified place in unspecified non-institutional (private) residence as the place of occurrence of the external cause: Secondary | ICD-10-CM | POA: Insufficient documentation

## 2021-03-13 DIAGNOSIS — S0990XA Unspecified injury of head, initial encounter: Secondary | ICD-10-CM | POA: Insufficient documentation

## 2021-03-13 DIAGNOSIS — T1491XA Suicide attempt, initial encounter: Secondary | ICD-10-CM

## 2021-03-13 DIAGNOSIS — S1181XA Laceration without foreign body of other specified part of neck, initial encounter: Secondary | ICD-10-CM | POA: Insufficient documentation

## 2021-03-13 DIAGNOSIS — Z20822 Contact with and (suspected) exposure to covid-19: Secondary | ICD-10-CM | POA: Insufficient documentation

## 2021-03-13 DIAGNOSIS — F333 Major depressive disorder, recurrent, severe with psychotic symptoms: Secondary | ICD-10-CM | POA: Insufficient documentation

## 2021-03-13 DIAGNOSIS — X781XXA Intentional self-harm by knife, initial encounter: Secondary | ICD-10-CM | POA: Insufficient documentation

## 2021-03-13 DIAGNOSIS — Z87891 Personal history of nicotine dependence: Secondary | ICD-10-CM | POA: Insufficient documentation

## 2021-03-13 DIAGNOSIS — Z046 Encounter for general psychiatric examination, requested by authority: Secondary | ICD-10-CM

## 2021-03-13 LAB — CBC WITH DIFFERENTIAL/PLATELET
Abs Immature Granulocytes: 0.02 10*3/uL (ref 0.00–0.07)
Basophils Absolute: 0 10*3/uL (ref 0.0–0.1)
Basophils Relative: 0 %
Eosinophils Absolute: 0.1 10*3/uL (ref 0.0–0.5)
Eosinophils Relative: 1 %
HCT: 40.7 % (ref 39.0–52.0)
Hemoglobin: 13.5 g/dL (ref 13.0–17.0)
Immature Granulocytes: 0 %
Lymphocytes Relative: 20 %
Lymphs Abs: 1.8 10*3/uL (ref 0.7–4.0)
MCH: 32 pg (ref 26.0–34.0)
MCHC: 33.2 g/dL (ref 30.0–36.0)
MCV: 96.4 fL (ref 80.0–100.0)
Monocytes Absolute: 0.6 10*3/uL (ref 0.1–1.0)
Monocytes Relative: 7 %
Neutro Abs: 6.7 10*3/uL (ref 1.7–7.7)
Neutrophils Relative %: 72 %
Platelets: 226 10*3/uL (ref 150–400)
RBC: 4.22 MIL/uL (ref 4.22–5.81)
RDW: 13.7 % (ref 11.5–15.5)
WBC: 9.2 10*3/uL (ref 4.0–10.5)
nRBC: 0 % (ref 0.0–0.2)

## 2021-03-13 LAB — COMPREHENSIVE METABOLIC PANEL
ALT: 10 U/L (ref 0–44)
AST: 19 U/L (ref 15–41)
Albumin: 4 g/dL (ref 3.5–5.0)
Alkaline Phosphatase: 73 U/L (ref 38–126)
Anion gap: 6 (ref 5–15)
BUN: 10 mg/dL (ref 8–23)
CO2: 26 mmol/L (ref 22–32)
Calcium: 9.2 mg/dL (ref 8.9–10.3)
Chloride: 107 mmol/L (ref 98–111)
Creatinine, Ser: 1.11 mg/dL (ref 0.61–1.24)
GFR, Estimated: >60 mL/min (ref >60)
Glucose, Bld: 99 mg/dL (ref 70–99)
Potassium: 3.3 mmol/L — ABNORMAL LOW (ref 3.5–5.1)
Sodium: 139 mmol/L (ref 135–145)
Total Bilirubin: 0.9 mg/dL (ref 0.3–1.2)
Total Protein: 6.8 g/dL (ref 6.5–8.1)

## 2021-03-13 LAB — RESP PANEL BY RT-PCR (FLU A&B, COVID) ARPGX2
Influenza A by PCR: NEGATIVE
Influenza B by PCR: NEGATIVE
SARS Coronavirus 2 by RT PCR: NEGATIVE

## 2021-03-13 LAB — ETHANOL: Alcohol, Ethyl (B): <10 mg/dL (ref <10)

## 2021-03-13 LAB — SALICYLATE LEVEL: Salicylate Lvl: <7 mg/dL — ABNORMAL LOW (ref 7.0–30.0)

## 2021-03-13 LAB — ACETAMINOPHEN LEVEL: Acetaminophen (Tylenol), Serum: <10 ug/mL — ABNORMAL LOW (ref 10–30)

## 2021-03-13 MED ORDER — LIDOCAINE HCL (PF) 1 % IJ SOLN
5.0000 mL | Freq: Once | INTRAMUSCULAR | Status: AC
  Administered 2021-03-13: 5 mL via INTRADERMAL
  Filled 2021-03-13: qty 5

## 2021-03-13 MED ORDER — POTASSIUM CHLORIDE CRYS ER 20 MEQ PO TBCR
40.0000 meq | EXTENDED_RELEASE_TABLET | Freq: Once | ORAL | Status: AC
  Administered 2021-03-14: 40 meq via ORAL
  Filled 2021-03-13: qty 2

## 2021-03-13 MED ORDER — ACETAMINOPHEN 325 MG PO TABS
650.0000 mg | ORAL_TABLET | Freq: Once | ORAL | Status: AC
  Administered 2021-03-14: 650 mg via ORAL
  Filled 2021-03-13: qty 2

## 2021-03-13 NOTE — ED Notes (Signed)
Filled IVC paperwork given to [facility]
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
## 2021-03-13 NOTE — ED Notes (Signed)
The pt dressed in [place] rubs personal belongings placed in  plastic bags and removed
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
## 2021-03-13 NOTE — BH Assessment (Addendum)
Clinician spoke to [employer], RN pt is not medically cleared waiting on labs. Clinician asked RN to let her know when the pt is medically cleared and able to be assessed.   Lannette Donath, NT to fax pt's IVC paperwork.   Redmond Pulling, MS, [facility], [facility] Triage Specialist [phone]
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
## 2021-03-13 NOTE — ED Notes (Signed)
Pt wanded by security. 

## 2021-03-13 NOTE — BH Assessment (Signed)
Clinician messaged Sydnee Cabal, RN, "Hey. It's Trey with TTS. Is the pt medically cleared. I seen he cut his throat."   Clinician messaged Rich Brave, NT if she can fax pt's IVC paperwork. Fax numbers were provided: [phone] or [phone]).   Clinician awaiting response.    Redmond Pulling, MS, [facility], [facility] Triage Specialist [phone]

## 2021-03-14 DIAGNOSIS — F0390 Unspecified dementia without behavioral disturbance: Secondary | ICD-10-CM | POA: Insufficient documentation

## 2021-03-14 DIAGNOSIS — F332 Major depressive disorder, recurrent severe without psychotic features: Secondary | ICD-10-CM | POA: Insufficient documentation

## 2021-03-14 LAB — RAPID URINE DRUG SCREEN, HOSP PERFORMED
Amphetamines: NOT DETECTED
Barbiturates: NOT DETECTED
Benzodiazepines: NOT DETECTED
Cocaine: NOT DETECTED
Opiates: NOT DETECTED
Tetrahydrocannabinol: NOT DETECTED

## 2021-03-14 MED ORDER — QUETIAPINE FUMARATE 25 MG PO TABS
25.0000 mg | ORAL_TABLET | Freq: Two times a day (BID) | ORAL | Status: DC
  Administered 2021-03-14 – 2021-03-23 (×17): 25 mg via ORAL
  Filled 2021-03-14 (×25): qty 1
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
## 2021-03-14 NOTE — ED Provider Notes (Signed)
Emergency Medicine Observation Re-evaluation Note  Ridgely Anastacio is a 65 y.o. male, seen on rounds today.  Pt initially presented to the ED for complaints of Dementia and Delusional Currently, the patient is resting. Suicide attempt by lac to anterior neck. Repair by prior provider. Medically cleared.   Physical Exam  BP (!) 183/97   Pulse 70   Temp 98.4 F (36.9 C)   Resp 17   SpO2 99%  Physical Exam General: calm, resting Cardiac: warm and well perfused Lungs: even unlabored Psych: calm  ED Course / MDM  EKG:   I have reviewed the labs performed to date as well as medications administered while in observation.  Recent changes in the last 24 hours include no major events overnight.  Per Damita Dunnings - they are going to rec geri psych placement  Plan  Current plan is for geri psych placement.  Oday Ridings is under involuntary commitment.     Milagros Loll, MD 03/14/21 1336

## 2021-03-14 NOTE — BH Assessment (Signed)
TTS has been attempting to see patient for the past hour, but he is currently in a hallway bed.  TTS Cart and Interpretation cart can be used to assess patient, but he will need to be in a quiet area for this to work.  TTS on stand-by until patient can be moved.

## 2021-03-14 NOTE — Progress Notes (Addendum)
Patient has been denied by [facility] due to him being recommended for geri psych placement. Patient meets inpatient criteria per Ophelia Shoulder, NP. Patient referred to the following facilities:  [facility]  [address], [place] [address] [phone]  [facility] [address]  [address], [place] [address] [phone]  [facility]  [address] [place], [place] [address] [phone]  [facility]  [address] [place] [address] [phone]  [facility]  [address] [place], [place] [address] [phone]  [facility]  [address], [place] [address] [phone]  [facility]  [address] [place] [address] [phone]  [facility]  [address], [place] [address] [phone]  [facility]  [address] [place] [address] [phone]  [facility]  [address] [place] [address] [phone]  [facility]  [address], [place] [address] [phone]  [facility]  [address], [place] [address] [phone]  [facility]  [address] [place] [address] [phone]  [facility]  [address] [place], [place] [address] [phone]  [facility]  [address], [place] [address] [phone]  [facility]  [address], [place] [address] [phone]    CSW will continue to monitor disposition.    Damita Dunnings, MSW, LCSW-A   1:38 PM 03/14/2021

## 2021-03-14 NOTE — BH Assessment (Signed)
Comprehensive Clinical Assessment (CCA) Note  03/14/2021 Thomas Ortiz [id number]  The patient demonstrates the following risk factors for suicide: Chronic risk factors for suicide include: psychiatric disorder of depression, substance use disorder, and previous self-harm 2 prior suicide attempts . Acute risk factors for suicide include: unemployment and social withdrawal/isolation. Protective factors for this patient include: positive social support. Considering these factors, the overall suicide risk at this point appears to be high. Patient is not appropriate for outpatient follow up.    GAD-7    Flowsheet Row Office Visit from 07/17/2020 in [facility] Office Visit from 10/26/2019 in [facility] Office Visit from 01/11/2018 in [facility] Office Visit from 10/12/2017 in [facility] Office Visit from 02/26/2017 in [facility]  Total GAD-7 Score 0 2 3 4 6       Mini-Mental    Flowsheet Row Office Visit from 07/17/2020 in [facility]  Total Score (max 30 points ) 14      PHQ2-9    Flowsheet Row ED from 03/13/2021 in [facility] EMERGENCY DEPARTMENT Office Visit from 07/17/2020 in [facility] Office Visit from 10/26/2019 in [facility] Office Visit from 01/11/2018 in [facility] Office Visit from 10/12/2017 in [facility]  PHQ-2 Total Score 4 0 4 0 2  PHQ-9 Total Score 15 0 9 2 5       Flowsheet Row ED from 11/26/2020 in [facility] EMERGENCY DEPARTMENT ED from 09/11/2020 in [facility] ED from 07/20/2020 in [facility] RISK CATEGORY No Risk No Risk No Risk        Chief Complaint:  Chief Complaint  Patient  presents with   Dementia   Delusional   Patient presented to the [facility] ED suicidal and he had cut his throat and was bleeding.  Patient states that he tried to kill himself because his family did not want him living with them anymore because he states that he has no money to help pay for things. Patient states that he has two previous suicide attempts.  Patient has a history of depression and states that he has been treated with medications in the past, but he has no current provider.  He states that he has never been in an inpatient treatment center in the past.  Patient denies HI/Psychosis.  Patient denies any history of alcohol or drug use. Patient states that his sleep and appetite are good.  He denies any history of self-mutilation or abuse.  TTS contacted patient's daughter, Thomas Ortiz), for collateral information.  She states that patient has been diagnosed with Dementia and things have been getting worse for the past year.  She states that he left the house yesterday to meet his grandchildren getting off the school bus, but he ended up walking off and she states that they did not know where he was and they went out looking for him.  She states that she has been trying to take care of him for the past year and states that she has cameras in his room trying to keep an eye on him.  She states that they did not tell him to leave the house, it is just his delusional thinking.   Visit Diagnosis:  F03.90 Dementia,                               F33.2 MDD Recurrent Severe   CCA Screening, Triage and Referral (STR)  Patient Reported Information How did you hear about [place]? Self  What Is the Reason for Your Visit/Call Today? paranoia  How Long Has This Been Causing You Problems? > than 6 months  What Do You Feel Would Help You the Most Today? Treatment for Depression or other mood problem   Have You Recently Had Any Thoughts About Hurting Yourself? Yes  Are You Planning to  Commit Suicide/Harm Yourself At This time? Yes (patient cut his throat last night)   Have you Recently Had Thoughts About Hurting Someone Thomas Ortiz? No  Are You Planning to Harm Someone at This Time? No  Explanation: No data recorded  Have You Used Any Alcohol or Drugs in the Past 24 Hours? No  How Long Ago Did You Use Drugs or Alcohol? No data recorded What Did You Use and How Much? No data recorded  Do You Currently Have a Therapist/Psychiatrist? No  Name of Therapist/Psychiatrist: No data recorded  Have You Been Recently Discharged From Any Office Practice or Programs? No  Explanation of Discharge From Practice/Program: No data recorded    CCA Screening Triage Referral Assessment Type of Contact: Tele-Assessment  Telemedicine Service Delivery:   Is this Initial or Reassessment? Initial Assessment  Date Telepsych consult ordered in [facility]:  03/13/21  Time Telepsych consult ordered in [facility]:  1957  Location of Assessment: [facility] ED  Provider Location: Other (comment) (virtual, off site)   Collateral Involvement: TTS contacted patient's daughter, Thomas Ortiz   Does Patient Have a Automotive engineer Guardian? No data recorded Name and Contact of Legal Guardian: No data recorded If Minor and Not Living with Parent(s), Who has Custody? No data recorded Is CPS involved or ever been involved? Never  Is APS involved or ever been involved? Never   Patient Determined To Be At Risk for Harm To Self or Others Based on Review of Patient Reported Information or Presenting Complaint? Yes, for Self-Harm  Method: No data recorded Availability of Means: No data recorded Intent: No data recorded Notification Required: No data recorded Additional Information for Danger to Others Potential: No data recorded Additional Comments for Danger to Others Potential: No data recorded Are There Guns or Other Weapons in Your Home? No data recorded Types of Guns/Weapons: No data recorded Are These  Weapons Safely Secured?                            No data recorded Who Could Verify You Are Able To Have These Secured: No data recorded Do You Have any Outstanding Charges, Pending Court Dates, Parole/Probation? No data recorded Contacted To Inform of Risk of Harm To Self or Others: Family/Significant Other: (family is aware)    Does Patient Present under Involuntary Commitment? No  IVC Papers Initial File Date: No data recorded  [place] of Residence: [place]   Patient Currently Receiving the Following Services: Not Receiving Services   Determination of Need: Emergent (2 hours)   Options For Referral: Inpatient Hospitalization     CCA Biopsychosocial Patient Reported Schizophrenia/Schizoaffective Diagnosis in Past: No   Strengths: UTA   Mental Health Symptoms Depression:   None   Duration of Depressive symptoms:    Mania:   None  Anxiety:    None   Psychosis:   None   Duration of Psychotic symptoms:    Trauma:   None   Obsessions:   None   Compulsions:   None   Inattention:  No data recorded  Hyperactivity/Impulsivity:   N/A   Oppositional/Defiant Behaviors:   None   Emotional Irregularity:   None   Other Mood/Personality Symptoms:  No data recorded   Mental Status Exam Appearance and self-care  Stature:   Small   Weight:   Average weight   Clothing:   Age-appropriate; Neat/clean   Grooming:   Normal   Cosmetic use:   None   Posture/gait:   Normal   Motor activity:   Not Remarkable   Sensorium  Attention:   Normal   Concentration:   Normal   Orientation:   Person; Place; Situation   Recall/memory:   Defective in Immediate   Affect and Mood  Affect:   Full Range   Mood:  No data recorded  Relating  Eye contact:   Normal   Facial expression:   Responsive   Attitude toward examiner:   Cooperative   Thought and Language  Speech flow:  Clear and Coherent   Thought content:   Appropriate to Mood  and Circumstances   Preoccupation:   None   Hallucinations:   None   Organization:  No data recorded  [employer] of Knowledge:   Impoverished by (Comment)   Intelligence:   Average   Abstraction:   Normal   Judgement:   Fair   Dance movement psychotherapist:   Distorted   Insight:   Fair   Decision Making:   Normal   Social Functioning  Social Maturity:   Responsible   Social Judgement:   Normal   Stress  Stressors:   Illness   Coping Ability:   Normal   Skill Deficits:   None   Supports:   Family     Religion:    Leisure/Recreation:    Exercise/Diet:     CCA Employment/Education Employment/Work Situation: Employment / Work Situation Employment Situation: Employed Work Stressors: unable to work due to his mental health issues Patient's Job has Been Impacted by Current Illness: No Has Patient ever Been in Equities trader?: No  Education: Education Is Patient Currently Attending School?: No Last Grade Completed: 10 Did You Product manager?: No Did You Have An Individualized Education Program (IIEP): No Did You Have Any Difficulty At [employer]?: No Patient's Education Has Been Impacted by Current Illness: No   CCA Family/Childhood History Family and Relationship History: Family history Marital status: Married Number of Years Married:  (unknown) What types of issues is patient dealing with in the relationship?: none reported Does patient have children?: Yes How many children?:  (unknown) How is patient's relationship with their children?: lives with his daughter  Childhood History:  Childhood History By whom was/is the patient raised?: Mother Did patient suffer any verbal/emotional/physical/sexual abuse as a child?: No Did patient suffer from severe childhood neglect?: No Has patient ever been sexually abused/assaulted/raped as an adolescent or adult?: No Was the patient ever a victim of a crime or a disaster?: No Witnessed  domestic violence?: No Has patient been affected by domestic violence as an adult?: No  Child/Adolescent Assessment:     CCA Substance Use Alcohol/Drug Use: Alcohol / Drug Use Pain Medications: See MAR Over the Counter: See MAR History of alcohol / drug use?: No history of alcohol / drug abuse  ASAM's:  Six Dimensions of Multidimensional Assessment  Dimension 1:  Acute Intoxication and/or Withdrawal Potential:      Dimension 2:  Biomedical Conditions and Complications:      Dimension 3:  Emotional, Behavioral, or Cognitive Conditions and Complications:     Dimension 4:  Readiness to Change:     Dimension 5:  Relapse, Continued use, or Continued Problem Potential:     Dimension 6:  Recovery/Living Environment:     ASAM Severity Score:    ASAM Recommended Level of Treatment:     Substance use Disorder (SUD)    Recommendations for Services/Supports/Treatments:    Discharge Disposition:    DSM5 Diagnoses: Patient Active Problem List   Diagnosis Date Noted   Dementia without behavioral disturbance (HCC)    Severe episode of recurrent major depressive disorder, without psychotic features (HCC)    Hyperlipidemia 10/06/2018   Osteoarthritis 01/11/2018   Bradycardia 11/20/2017   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   Vertigo 10/12/2017   Pain of molar 09/15/2016   Age-related cataract of both eyes 09/15/2016   Pain in both feet 03/26/2016   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

## 2021-03-14 NOTE — Progress Notes (Signed)
Patient information has been sent to [facility] via secure chat to review for potential admission. Patient meets inpatient criteria per Dr. Lucianne Muss.   Situation ongoing, CSW will continue to monitor progress.    Signed:  Damita Dunnings, MSW, LCSW-A  03/14/2021 9:44 AM

## 2021-03-15 MED ORDER — ZIPRASIDONE MESYLATE 20 MG IM SOLR
10.0000 mg | Freq: Once | INTRAMUSCULAR | Status: AC
  Administered 2021-03-15: 10 mg via INTRAMUSCULAR
  Filled 2021-03-15: qty 20

## 2021-03-15 MED ORDER — LORAZEPAM 2 MG/ML IJ SOLN
1.0000 mg | Freq: Once | INTRAMUSCULAR | Status: AC
  Administered 2021-03-15: 1 mg via INTRAMUSCULAR
  Filled 2021-03-15: qty 1

## 2021-03-15 MED ORDER — ACETAMINOPHEN 325 MG PO TABS
650.0000 mg | ORAL_TABLET | Freq: Four times a day (QID) | ORAL | Status: DC | PRN
  Administered 2021-03-15 – 2021-03-24 (×3): 650 mg via ORAL
  Filled 2021-03-15 (×3): qty 2

## 2021-03-15 MED ORDER — LORAZEPAM 1 MG PO TABS
1.0000 mg | ORAL_TABLET | Freq: Once | ORAL | Status: AC
  Administered 2021-03-20: 1 mg via ORAL
  Filled 2021-03-15 (×2): qty 1

## 2021-03-15 NOTE — ED Provider Notes (Signed)
Patient continues to get out of bed, swinging at staff and is unsafe climbing the side of the bed.  He has already been given Ativan and Geodon for sedation.  Concern for oversedation.  Patient is unfortunately in a hall bed which I do not think is most beneficial to his history of dementia and current psychiatric situation.  He is not able to be de-escalated verbally.  Plan for soft wrist restraints to see if sedating meds become effective.  If not we will have to consider further treatment.   Rozelle Logan, DO 03/15/21 2221

## 2021-03-15 NOTE — ED Notes (Signed)
Pt seen attempting to jump out of his bed. Staff and security back at bedside, attempting to re-orient pt.

## 2021-03-15 NOTE — ED Notes (Signed)
Tray arrived. Pt not very interested in eating. Sitting at end of bed

## 2021-03-15 NOTE — ED Provider Notes (Signed)
Patient agitated getting up out of bed and wandering around not following commands.  For his safety patient was sedated with Geodon and Ativan.   Virgina Norfolk, DO 03/15/21 2148

## 2021-03-15 NOTE — ED Provider Notes (Signed)
Emergency Medicine Observation Re-evaluation Note  Thomas Ortiz is a 65 y.o. male, seen on rounds today.  Pt initially presented to the ED for complaints of Dementia and Delusional Currently, the patient is resting.  Physical Exam  BP (!) 132/102   Pulse 68   Temp 98.1 F (36.7 C) (Oral)   Resp 17   SpO2 98%  Physical Exam General: Calm, sleeping Cardiac: Extremities well perfused Lungs: Breathing is even and unlabored Psych: No agitation. Skin: Sutures present on laceration to anterior neck.  Mild surrounding erythema.  ED Course / MDM  EKG:   I have reviewed the labs performed to date as well as medications administered while in observation.  Recent changes in the last 24 hours include patient currently awaiting Geri psych facility placement.  Plan  Current plan is for inpatient geriatric-psychiatric admission. Thomas Ortiz is under involuntary commitment.      Gloris Manchester, MD 03/15/21 [phone]

## 2021-03-15 NOTE — Progress Notes (Signed)
Patient has been recommended for geri-psych inpatient and has been faxed out. Patient meets inpatient criteria per Ophelia Shoulder ,NP. Patient referred to the following facilities:  [facility]  [address] [place] [address] [phone]  [facility] [address]  [address], [place] [address] [phone]  [facility]  [address] [place], [place] [address] [phone]  [facility]  [address], [place] [address] [phone]  [facility]  [address] [place], [place] [address] [phone]  [facility]  [address] [place] [address] [phone]  [facility]  [address] [place] [address] [phone]  [facility]  [address], [place] [address] [phone]  [facility]  [address] [place] [address] [phone]  [facility]  [address] [place] [address] [phone]  [facility]  [address] [place] [address] [phone]  [facility]  [address], [place] [address] [phone]  [facility]  [address] [place] [address] [phone]  [facility]  [address] [place], [place] [address] [phone]  [facility]  [address] [place] [address] [phone]  [facility]  [address], [place] [address] [phone]    CSW will continue to monitor disposition.    Damita Dunnings, MSW, LCSW-A  9:26 AM  03/15/2021

## 2021-03-15 NOTE — ED Notes (Signed)
Pt is tearful at this time.  Laceration was assessed and appears a little red and slightly warm to touch at this time.

## 2021-03-15 NOTE — BH Assessment (Addendum)
Re-Assessment 03/15/21:  Clinician reassessed patient today. However, prior to re-assessment completed a chart review. Chart review from 03/13/2021 notes: "Patient presented to the [facility] ED suicidal and he had cut his throat and was bleeding.  Patient states that he tried to kill himself because his family did not want him living with them anymore because he states that he has no money to help pay for things. Patient states that he has two previous suicide attempts.  Patient has a history of depression and states that he has been treated with medications in the past, but he has no current provider.  He states that he has never been in an inpatient treatment center in the past.  Patient denies HI/Psychosis.  Patient denies any history of alcohol or drug use. Patient states that his sleep and appetite are good.  He denies any history of self-mutilation or abuse.  TTS contacted patient's daughter, Lucretia Field [phone]), for collateral information.  She states that patient has been diagnosed with Dementia and things have been getting worse for the past year.  She states that he left the house yesterday to meet his grandchildren getting off the school bus, but he ended up walking off and she states that they did not know where he was and they went out looking for him.  She states that she has been trying to take care of him for the past year and states that she has cameras in his room trying to keep an eye on him.  She states that they did not tell him to leave the house, it is just his delusional thinking".  Clinician met with patient via tele assessment today. Upon observation patient appeared anxious and required encouragement to engage in the assessment. He was initially standing up, however; prompted by the sitter to sit down for his assessment. Patient was oriented to self and DOB. However, unable to identify time, place, and/or situation. When asked questions he mostly pointed at the sitter asking  her to answer question for him. He appeared severely altered and was unable to answer basic questions such as his living arrangements, daughters name, number of children that he has, marital status, etc. He remained confused throughout the assessment but seemed re-direct able. Clinician unable to determine SI, HI, and if patient is experiencing any symptoms related to [facility]. His presentation appeared to be congruent to one diagnosed with severe dementia.   Disposition: Per Darrol Angel, NP, patient continues to meet criteria for inpatient psychiatric treatment. Disposition pending Gero Psych placement by the Disposition LCSW.

## 2021-03-15 NOTE — ED Notes (Signed)
Non-violent restraints applied to pt's wrists for pt safety

## 2021-03-15 NOTE — ED Notes (Signed)
Pt attempting to hit staff at bedside

## 2021-03-15 NOTE — ED Notes (Signed)
Pt ambulating out of bed and disrupting other hall patients despite multiple attempts at re-orientation. Becoming more agitated, inconsolable, and unable to orient back to his bed. Security called to bedside.

## 2021-03-16 ENCOUNTER — Encounter (HOSPITAL_COMMUNITY): Payer: Self-pay | Admitting: Registered Nurse

## 2021-03-16 DIAGNOSIS — T1491XA Suicide attempt, initial encounter: Secondary | ICD-10-CM

## 2021-03-16 DIAGNOSIS — Z046 Encounter for general psychiatric examination, requested by authority: Secondary | ICD-10-CM

## 2021-03-16 DIAGNOSIS — F333 Major depressive disorder, recurrent, severe with psychotic symptoms: Secondary | ICD-10-CM

## 2021-03-16 LAB — AMMONIA: Ammonia: 16 umol/L (ref 9–35)

## 2021-03-16 MED ORDER — OLANZAPINE 5 MG PO TBDP
10.0000 mg | ORAL_TABLET | Freq: Every day | ORAL | Status: DC
  Administered 2021-03-16 – 2021-03-24 (×9): 10 mg via ORAL
  Filled 2021-03-16 (×9): qty 2

## 2021-03-16 NOTE — ED Notes (Signed)
Pt received breakfast, sitter at bedside.

## 2021-03-16 NOTE — ED Notes (Signed)
Phlebotomist is coming to draw ordered labs.

## 2021-03-16 NOTE — ED Notes (Signed)
Pt is now awake. Tried to get out of bed. Redirected by staff and assisted back to bed. Currently eating breakfast. Pt is confused and needs constant redirection. No agitation observed at this time. Safety precautions maintained. 1:1 with sitter continued.

## 2021-03-16 NOTE — ED Provider Notes (Signed)
ED ECG REPORT   Date: 03/16/2021  Rate: 90  Rhythm: normal sinus rhythm  QRS Axis: normal  Intervals: normal  ST/T Wave abnormalities: normal  Conduction Disutrbances:none  Narrative Interpretation:   Old EKG Reviewed: none available  I have personally reviewed the EKG tracing and agree with the computerized printout as noted.    Gwyneth Sprout, MD 03/16/21 [phone]

## 2021-03-16 NOTE — Progress Notes (Signed)
Per Loreen Freud, patient meets criteria for inpatient treatment. There are no available or appropriate beds at [facility] today. CSW faxed referrals to the following facilities for review:  [facility]  Pending - Request Sent N/A [address] [place] [address] [phone] --  [facility]  Pending - Request Sent N/A [address] [place] [address] [phone] --  [facility]  Pending - Request Sent N/A [address] [place] [address] [phone] --  [facility]  Pending - Request Sent N/A [address], [place] [address] [phone] --  [facility]  Pending - Request Sent N/A [address] [place], [place] [address] [phone] --  [facility]  Pending - Request Sent N/A [address], [place] [address] [phone] --  [facility]  Pending - Request Sent N/A [address], [place] [address] [phone] --  [facility]  Pending - Request Sent N/A [address] [place] [address] [phone] --  [facility]  Pending - Request Sent N/A [address], [place] [address] [phone] --  [facility]  Pending - Request Sent N/A [address], [place] [address] [phone] --  [facility]  Pending - Request Sent N/A [address] [place] [address] [phone] --  [facility]  Pending - Request Sent N/A [address], [place] [address] [phone] --  [facility]  Pending - Request Sent N/A [address] [place], [place] [address] [phone] --  [facility]  Pending - Request Sent N/A [address] [place], [place] [address] [phone] --  [facility]  Pending - Request Sent N/A [address] [place] [address] [phone] --  [facility]  Pending - Request Sent N/A [address] [place] [address] [phone] --  [facility]  Pending - No Request Sent N/A [address] [place]., [place] [address] [phone] --  [facility]  Pending - No Request [address] [place] [address] [phone] --  [facility]  Pending - No Request Sent N/A [address] [place] [address] [phone]    TTS will continue to seek bed placement.  Crissie Reese, MSW, LCSW-A, LCAS-A Phone: [phone] Disposition/TOC

## 2021-03-16 NOTE — ED Notes (Signed)
Pt observed sleeping at this time. Sitter at bedside. No acute distress.

## 2021-03-16 NOTE — ED Notes (Signed)
Pt woke up and started getting out of bed. Assisted by staff to the bathroom. Pt observed with unsteady gait. Pt urinated and assisted back to bed. No agitated observed at this time. Pt also spoke to his daughter on the phone. 1:1 with sitter continued. Safety precautions maintained.

## 2021-03-16 NOTE — ED Notes (Signed)
EKG shown to Dr.Knapp.

## 2021-03-16 NOTE — ED Notes (Signed)
Pt attempting to climb out of bed. Brought to back of bed & encouraged to lay down

## 2021-03-16 NOTE — ED Notes (Signed)
Pt was walked to the restroom by the sitter and NT walked him back to the bedroom where his pants were soaked from urine. Pt was cleaned up and changed into new psych scrubs and bedding was changed.

## 2021-03-16 NOTE — ED Notes (Signed)
Pt repeatedly walking out of his room and attempting to walk down the hall. Repeatedly oriented back to his room and bed.

## 2021-03-16 NOTE — Consult Note (Signed)
Telepsych Consultation   Reason for Consult:  Suicide attempt Referring Physician:  Claude Manges, PA-C Location of Patient: [facility] ED Location of Provider: Other: [facility]  Patient Identification: Teoman Giraud MRN:  [id number] Principal Diagnosis: Major depressive disorder, recurrent, severe with psychotic symptoms (HCC) Diagnosis:  Principal Problem:   Major depressive disorder, recurrent, severe with psychotic symptoms (HCC) Active Problems:   Suicide attempt (HCC)   Total Time spent with patient: 30 minutes  Subjective:   Abb Gobert is a 65 y.o. male patient admitted [name] after presenting with complaints of suicide attempt by cutting his throat.[name]  HPI:  Deron Poole, 65 y.o., male patient seen via tele health by this provider, consulted with Dr. Nelly Rout; and chart reviewed on 03/16/21.  On evaluation Jandiel Magallanes speaks very little [place] and interpreter used for assessment.  Patient is not a good historian at this time and is only oriented to himself.  Patient was unable to give his date of birth or age.  When asked if he knew where he was patient stated "I know I'm somewhere in [place]."  At this time patient denies suicidal ideation stating that he only cut himself because he was playing.  Patient denies auditory/visual hallucinations.  Unable to determine but there may be some delusional thinking. During evaluation Cornel Werber is elevated up in bed in no acute distress.  He is alert but a little drowsy.  He was woke to participate in psychiatric assessment.  At this time he is only oriented to himself.  His mood is depressed with flat affect.  He does not appear to be to be responding to internal/external stimuli; but there is a possibility of some delusional thinking.  He denies suicidal/self-harm/homicidal ideation, psychosis, and paranoia but, cut his throat prior to admission in a suicide attempt.  We will continue to recommend inpatient psychiatric treatment  Patient seen  by this writer on 09/11/2020 at which time patient was oriented x 4 and able to carry on conversation.  His daughter had concerns related to patient's memory at which time it was suggested that patient follow-up with a neurologist.   Past Psychiatric History: Major depression recurrent severe  Risk to Self: Yes Risk to Others: Denies Prior Inpatient Therapy: Patient unable to give information Prior Outpatient Therapy: Patient unable to give information  Past Medical History:  Past Medical History:  Diagnosis Date   Hypertension 2007    Past Surgical History:  Procedure Laterality Date   bilateral foot sx  2011   CHOLECYSTECTOMY N/A 01/30/2015   Procedure: LAPAROSCOPIC CHOLECYSTECTOMY WITH INTRAOPERATIVE CHOLANGIOGRAM;  Surgeon: Manus Rudd, MD;  Location: [facility] OR;  Service: General;  Laterality: N/A;   COLONOSCOPY N/A 08/17/2015   Procedure: COLONOSCOPY;  Surgeon: West Bali, MD;  Location: [facility] END[facility] SUITE;  Service: Endoscopy;  Laterality: N/A;  1:45 PM   Family History: History reviewed. No pertinent family history. Family Psychiatric  History: Patient unaware Social History:  Social History   Substance and Sexual Activity  Alcohol Use No   Alcohol/week: 2.0 standard drinks   Types: 2 Cans of beer per week   Comment: occasionally     Social History   Substance and Sexual Activity  Drug Use No    Social History   Socioeconomic History   Marital status: Married    Spouse name: Not on file   Number of children: Not on file   Years of education: Not on file   Highest education level: Not on file  Occupational History   Not on file  Tobacco Use   Smoking status: Former   Smokeless tobacco: Never  Vaping Use   Vaping Use: Never used  Substance and Sexual Activity   Alcohol use: No    Alcohol/week: 2.0 standard drinks    Types: 2 Cans of beer per week    Comment: occasionally   Drug use: No   Sexual activity: Not on file  Other Topics Concern   Not on file   Social History Narrative   Right handed    Lives with family    Social Determinants of Health   Financial Resource Strain: Not on file  Food Insecurity: Not on file  Transportation Needs: Not on file  Physical Activity: Not on file  Stress: Not on file  Social Connections: Not on file   Additional Social History:    Allergies:  No Known Allergies  Labs: No results found for this or any previous visit (from the past 48 hour(s)).  Medications:  Current Facility-Administered Medications  Medication Dose Route Frequency Provider Last Rate Last Admin   acetaminophen (TYLENOL) tablet 650 mg  650 mg Oral Q6H PRN Arby Barrette, MD   650 mg at 03/15/21 1442   LORazepam (ATIVAN) tablet 1 mg  1 mg Oral Once Horton, Kristie M, DO       QUEtiapine (SEROQUEL) tablet 25 mg  25 mg Oral BID Ophelia Shoulder E, NP   25 mg at 03/16/21 1105   Current Outpatient Medications  Medication Sig Dispense Refill   amLODipine (NORVASC) 5 MG tablet TAKE 1 TABLET (5 MG TOTAL) BY MOUTH DAILY. (Patient not taking: Reported on 03/14/2021) 90 tablet 1   meclizine (ANTIVERT) 25 MG tablet TAKE 1 TABLET (25 MG TOTAL) BY MOUTH 2 (TWO) TIMES DAILY AS NEEDED FOR DIZZINESS. (Patient not taking: Reported on 03/14/2021) 30 tablet 1   memantine (NAMENDA) 10 MG tablet Take 1 tablet (10 mg total) by mouth 2 (two) times daily. (Patient not taking: Reported on 03/14/2021) 30 tablet 11   metoprolol succinate (TOPROL-XL) 25 MG 24 hr tablet TAKE 1 TABLET (25 MG TOTAL) BY MOUTH DAILY. (Patient not taking: Reported on 03/14/2021) 90 tablet 1   QUEtiapine (SEROQUEL) 25 MG tablet Take 1 tablet at night for agitation, paranoia (Patient not taking: Reported on 03/14/2021) 30 tablet 3   rosuvastatin (CRESTOR) 40 MG tablet TAKE 1 TABLET (40 MG TOTAL) BY MOUTH DAILY. (Patient not taking: Reported on 03/14/2021) 90 tablet 1    Musculoskeletal: Strength & Muscle Tone:  Unable to assess Gait & Station:  Did not see patient ambulate Patient  leans: N/A    Psychiatric Specialty Exam:  Presentation  General Appearance: Appropriate for Environment  Eye Contact:Fair  Speech:Clear and Coherent; Blocked (a little slow)  Speech Volume:Decreased  Handedness:Right   Mood and Affect  Mood:Dysphoric  Affect:Flat   Thought Process  Thought Processes:Disorganized; Linear  Descriptions of Associations:Circumstantial  Orientation:Partial (Oriented to self)  Thought Content:Logical  History of Schizophrenia/Schizoaffective disorder:No  Duration of Psychotic Symptoms:No data recorded Hallucinations:Hallucinations: None  Ideas of Reference:Delusions (Possible some delusional thinking)  Suicidal Thoughts:Suicidal Thoughts: No (Denies at this time; but did cut his throat in suicide attempt)  Homicidal Thoughts:Homicidal Thoughts: No   Sensorium  Memory:Immediate Poor; Recent Poor; Remote Poor  Judgment:Impaired  Insight:Lacking   Executive Functions  Concentration:Poor  Attention Span:Poor  Recall:Poor  Fund of Knowledge:Poor  Language:Poor (Interpreter used during assessment.  Primary language Spanish)   Psychomotor Activity  Psychomotor Activity:Psychomotor Activity: Decreased  Assets  Assets:Resilience; Social Support; Desire for Improvement   Sleep  Sleep:Sleep: Good    Physical Exam: Physical Exam Vitals and nursing note reviewed. Exam conducted with a chaperone present Psychiatrist).  Constitutional:      General: He is not in acute distress.    Appearance: Normal appearance. He is not ill-appearing.  Cardiovascular:     Rate and Rhythm: Normal rate.  Pulmonary:     Effort: Pulmonary effort is normal.  Neurological:     Mental Status: He is alert.     Comments: Oriented to self  Psychiatric:        Attention and Perception: He does not perceive auditory or visual hallucinations.        Mood and Affect: Mood is depressed.        Speech: Speech is delayed.        Behavior:  Behavior is withdrawn. Behavior is cooperative.        Thought Content: Thought content is delusional (Possible some delusional thinking). Thought content includes suicidal ideation.        Cognition and Memory: Cognition is impaired. Memory is impaired.   Review of Systems  Unable to perform ROS: Acuity of condition (Patient unable to answer most questions)  Psychiatric/Behavioral:  Positive for depression. Hallucinations: Denies. Memory loss: There are some memory issues. Suicidal ideas: Denies at this time but did cut his throat in a suicide attempt.Nervous/anxious: Stable.        Patient stating that his current place is "I know I'm somewhere in [place]."  Blood pressure (!) 121/95, pulse 85, temperature 98.5 F (36.9 C), temperature source Oral, resp. rate 12, SpO2 100 %. There is no height or weight on file to calculate BMI.  Treatment Plan Summary: Daily contact with patient to assess and evaluate symptoms and progress in treatment, Medication management, and Plan inpatient psychiatric treatment  Labs reviewed CBC/differential: Elevated WBC CMP: Decreased potassium, elevated creatinine decreased GFR No urinalysis obtained.  Will order.  Also ordered RPR, and Ammonia  Will order EKG to rule out prolong QTc with the start of psychotropic medication.  CT scan of head done 07/20/2020:  IMPRESSION: 1. No acute intracranial process. 2. Chronic hypodensities bilateral parieto-occipital periventricular white matter most consistent with chronic small vessel ischemic change  Medication Management: Patient started on Seroquel 25 mg twice daily on 03/14/2021.  Continue no changes at this time  Disposition: Recommend psychiatric Inpatient admission when medically cleared.  This service was provided via telemedicine using a 2-way, interactive audio and video technology.  Names of all persons participating in this telemedicine service and their role in this encounter. Name: Assunta Found   Role: NP  Name: Dr. Nelly Rout Role: Psychiatrist  Name: Linard Millers  Role: Patient  Name:  Role:    Secure message sent to patient's nurse Pasty Arch, RN informing:  Psychiatric consult completed; continue to recommend inpatient psychiatric treatment.  If no beds available at [facility] patient will be faxed out to surrounding facilities for appropriate inpatient psychiatric bed.  Ordered an EKG: To rule out prolonged QTc, I also ordered ammonia level, urinalysis, and RPR.  Please inform MD only default listed.  Cheick Suhr, NP 03/16/2021 2:05 PM

## 2021-03-16 NOTE — ED Notes (Addendum)
Clothing changed, peri care and skin care done, patient assisted with care. Bilateral soft wrist restraints off during care. Will trial leaving restraints off as patient is at present compliant and following direction. Verbal report given to RN. Patient moved to rm 44.

## 2021-03-16 NOTE — ED Notes (Signed)
Spoke to provider Rankin,NP from psych. Per psych, psychiatric consult completed; continue to recommend inpatient psychiatric treatment. If no beds available at [facility] patient will be faxed out to surrounding facilities for appropriate inpatient psychiatric bed.

## 2021-03-17 LAB — RPR: RPR Ser Ql: NONREACTIVE

## 2021-03-17 MED ORDER — LORAZEPAM 1 MG PO TABS
1.0000 mg | ORAL_TABLET | Freq: Once | ORAL | Status: AC
  Administered 2021-03-17: 1 mg via ORAL
  Filled 2021-03-17: qty 1

## 2021-03-17 MED ORDER — LORAZEPAM 2 MG/ML IJ SOLN
0.5000 mg | Freq: Once | INTRAMUSCULAR | Status: AC
  Administered 2021-03-18: 0.5 mg via INTRAMUSCULAR
  Filled 2021-03-17: qty 1

## 2021-03-17 NOTE — ED Notes (Signed)
Pt resting comfortably

## 2021-03-17 NOTE — ED Notes (Signed)
Pt brief changed. No complaints or requests

## 2021-03-17 NOTE — ED Provider Notes (Signed)
Emergency Medicine Observation Re-evaluation Note  Artemio Dobie is a 65 y.o. male, seen on rounds today.  Pt initially presented to the ED for complaints of Dementia and Delusional Currently, the patient is calm and alert, and appears to be in no acute distress.   Physical Exam  BP (!) 151/98   Pulse 100   Temp 98.7 F (37.1 C) (Oral)   Resp 16   SpO2 100%  Physical Exam General: calm, alert.  Cardiac: regular rate. Lungs: breathing comfortably. Psych: calm, alert. Currently does not appear to be responding to internal stimuli.   ED Course / MDM   I have reviewed the labs performed to date as well as medications administered while in observation.  Recent changes in the last 24 hours include ED observation and reassessment.   Plan   Lynton Crescenzo is under involuntary commitment.  Pt appears calm, stabilized on meds.   BH reassessment and placement remains pending - disposition per [facility] team.          Cathren Laine, MD 03/17/21 1900

## 2021-03-17 NOTE — ED Notes (Signed)
Pt dinner tray at bedside. Sitter remains with patient.

## 2021-03-17 NOTE — ED Notes (Signed)
Pt ambulated to bathroom with safety sitter and clinical staff. Disposable brief changed per urinary incontinence.

## 2021-03-17 NOTE — ED Notes (Signed)
Pt continuing to repeatedly exit his bed. Repeatedly re-oriented to his bed. EDP made aware.

## 2021-03-17 NOTE — ED Notes (Signed)
[name] daughter [phone] requesting an update

## 2021-03-17 NOTE — ED Notes (Signed)
1-on-1 sitter at pt bedside

## 2021-03-17 NOTE — ED Notes (Signed)
Pt remains asleep. Respirations even and unlabored.

## 2021-03-17 NOTE — ED Notes (Signed)
[name] (Daughter) of [place] in [place] call and ask for an update [phone].

## 2021-03-17 NOTE — ED Notes (Signed)
Placed Breakfast order 

## 2021-03-18 LAB — URINALYSIS, ROUTINE W REFLEX MICROSCOPIC
Bilirubin Urine: NEGATIVE
Glucose, UA: NEGATIVE mg/dL
Hgb urine dipstick: NEGATIVE
Ketones, ur: NEGATIVE mg/dL
Leukocytes,Ua: NEGATIVE
Nitrite: NEGATIVE
Protein, ur: NEGATIVE mg/dL
Specific Gravity, Urine: 1.016 (ref 1.005–1.030)
pH: 5 (ref 5.0–8.0)

## 2021-03-18 MED ORDER — DIPHENHYDRAMINE HCL 50 MG/ML IJ SOLN
25.0000 mg | Freq: Once | INTRAMUSCULAR | Status: AC
  Administered 2021-03-18: 25 mg via INTRAMUSCULAR
  Filled 2021-03-18: qty 1

## 2021-03-18 NOTE — ED Notes (Signed)
Pt appears to be sleeping respirations even and unlabored, no sitter at bedside, no sitter available.

## 2021-03-18 NOTE — ED Notes (Signed)
Updated pt's daughter on the phone. ?

## 2021-03-18 NOTE — Progress Notes (Signed)
Patient has been denied by [facility] due to him being recommended for geri-psych. Patient meets inpatient criteria per Liborio Nixon ,NP. Patient referred to the following facilities:  [facility]  [address], [place] [address] [phone]  [facility] [address]  [address], [place] [address] [phone]  [facility]  [address] [place], [place] [address] [phone]  [facility]  [address], [place] [address] [phone]  [facility]  [address] [place], [place] [address] [phone]  [facility]  [address], [place] [address] [phone]  [facility]  [address] [place] [address] [phone]  [facility]  [address], [place] [address] [phone]  [facility]  [address] [place] [address] [phone]  [facility]  [address] [place] [address] [phone]  [facility]  [address] [place] [address] [phone]  [facility]  [address], [place] [address] [phone]  [facility]  [address], [place] [address] [phone]  [facility]  [address] [place], [place] [address] [phone]  [facility]  [address], [place] [address] [phone]  [facility]  [address], [place] [address] [phone]  [facility]  [address] [place] [address]  [phone]  [facility]  [address] [place] [address] [phone]  [facility]  [address] [place] [address] [phone]    CSW will continue to monitor disposition.    Damita Dunnings, MSW, LCSW-A  9:35 AM 03/18/2021

## 2021-03-18 NOTE — ED Notes (Signed)
Pt is unable to eat independently. Assisted pt with meal, only at about 15% of meal. Pt drank all his milk and cranberry juice.

## 2021-03-18 NOTE — ED Notes (Signed)
Pt ambulated to bathroom with 2 fall assists. Pt did not use toilet but brief and scrub bottoms were wet. Clean brief and clean scrub bottoms provided

## 2021-03-18 NOTE — ED Notes (Signed)
Updated pt's daughter on POC.

## 2021-03-18 NOTE — ED Notes (Addendum)
Pt speaking to his daughter on this nurses's pocket phone at this time. Pt is speaking English to daughter on the phone.

## 2021-03-18 NOTE — ED Notes (Signed)
Called service response to check on status of pt's meal, was told that it should have arrived at 1811. Was told that they would put in another ticket for a meal to be delivered.

## 2021-03-18 NOTE — ED Notes (Signed)
PT sheets and brief changed

## 2021-03-18 NOTE — ED Notes (Signed)
Sitter arrived to sit with pt 

## 2021-03-18 NOTE — ED Notes (Signed)
Pt sheets, clothes and brief changed

## 2021-03-19 MED ORDER — DIPHENHYDRAMINE HCL 50 MG/ML IJ SOLN
25.0000 mg | Freq: Once | INTRAMUSCULAR | Status: AC
  Administered 2021-03-19: 25 mg via INTRAMUSCULAR
  Filled 2021-03-19: qty 1

## 2021-03-19 MED ORDER — LORAZEPAM 2 MG/ML IJ SOLN
1.0000 mg | Freq: Once | INTRAMUSCULAR | Status: AC
  Administered 2021-03-19: 1 mg via INTRAMUSCULAR
  Filled 2021-03-19: qty 1

## 2021-03-19 NOTE — Consult Note (Signed)
Telepsych Consultation   Reason for Consult:  Suicide attempt Referring Physician:  Claude Manges, PA-C Location of Patient: [facility] ED Location of Provider: Other: [facility]  Patient Identification: Thomas Ortiz MRN:  [id number] Principal Diagnosis: Major depressive disorder, recurrent, severe with psychotic symptoms (HCC) Diagnosis:  Principal Problem:   Major depressive disorder, recurrent, severe with psychotic symptoms (HCC) Active Problems:   Suicide attempt (HCC)   Total Time spent with patient: 30 minutes  Subjective:   Thomas Ortiz is a 65 y.o. male patient admitted Thomas Ortiz, ED after presenting with complaints of suicide attempt by cutting his throat.[name]  Psychiatric Reassessment 03/19/21 Thomas Ortiz, 65 y.o., male patient seen via tele health by this provider, consulted with Dr. Nelly Rout; and chart reviewed on 03/19/21.  On evaluation Thomas Ortiz is elevated up in bed unable to participate in assessment related to sleepiness.  Attempted to wake patient by shaking but difficult to arouse from sleep.  Spoke to patient's nurse and sitter who are at bedside both report patient has been sleeping all day.  Patient was medicated late last night related to confusion.  Patient is having to be fed, and is unsteady with ambulation. Will attempt to contact patient's daughter to get more information.    Psychiatric assessment 03/16/21 HPI:  Thomas Ortiz, 65 y.o., male patient seen via tele health by this provider, consulted with Dr. Nelly Rout; and chart reviewed on 03/19/21.  On evaluation Thomas Ortiz speaks very little [place] and interpreter used for assessment.  Patient is not a good historian at this time and is only oriented to himself.  Patient was unable to give his date of birth or age.  When asked if he knew where he was patient stated "I know I'm somewhere in [place]."  At this time patient denies suicidal ideation stating that he only cut himself because he was playing.  Patient denies  auditory/visual hallucinations.  Unable to determine but there may be some delusional thinking. During evaluation Thomas Ortiz is elevated up in bed in no acute distress.  He is alert but a little drowsy.  He was woke to participate in psychiatric assessment.  At this time he is only oriented to himself.  His mood is depressed with flat affect.  He does not appear to be to be responding to internal/external stimuli; but there is a possibility of some delusional thinking.  He denies suicidal/self-harm/homicidal ideation, psychosis, and paranoia but, cut his throat prior to admission in a suicide attempt.  We will continue to recommend inpatient psychiatric treatment  Patient seen by this writer on 09/11/2020 at which time patient was oriented x 4 and able to carry on conversation.  His daughter had concerns related to patient's memory at which time it was suggested that patient follow-up with a neurologist.   Past Psychiatric History: Major depression recurrent severe  Risk to Self: Yes Risk to Others: Denies Prior Inpatient Therapy: Patient unable to give information Prior Outpatient Therapy: Patient unable to give information  Past Medical History:  Past Medical History:  Diagnosis Date   Hypertension 2007    Past Surgical History:  Procedure Laterality Date   bilateral foot sx  2011   CHOLECYSTECTOMY N/A 01/30/2015   Procedure: LAPAROSCOPIC CHOLECYSTECTOMY WITH INTRAOPERATIVE CHOLANGIOGRAM;  Surgeon: Manus Rudd, MD;  Location: [facility] OR;  Service: General;  Laterality: N/A;   COLONOSCOPY N/A 08/17/2015   Procedure: COLONOSCOPY;  Surgeon: West Bali, MD;  Location: [facility] SUITE;  Service: Endoscopy;  Laterality: N/A;  1:45 PM   Family History: History reviewed. No pertinent family history. Family Psychiatric  History: Patient unaware Social History:  Social History   Substance and Sexual Activity  Alcohol Use No   Alcohol/week: 2.0 standard drinks   Types: 2 Cans of beer per week    Comment: occasionally     Social History   Substance and Sexual Activity  Drug Use No    Social History   Socioeconomic History   Marital status: Married    Spouse name: Not on file   Number of children: Not on file   Years of education: Not on file   Highest education level: Not on file  Occupational History   Not on file  Tobacco Use   Smoking status: Former   Smokeless tobacco: Never  Vaping Use   Vaping Use: Never used  Substance and Sexual Activity   Alcohol use: No    Alcohol/week: 2.0 standard drinks    Types: 2 Cans of beer per week    Comment: occasionally   Drug use: No   Sexual activity: Not on file  Other Topics Concern   Not on file  Social History Narrative   Right handed    Lives with family    Social Determinants of Health   Financial Resource Strain: Not on file  Food Insecurity: Not on file  Transportation Needs: Not on file  Physical Activity: Not on file  Stress: Not on file  Social Connections: Not on file   Additional Social History:    Allergies:  No Known Allergies  Labs: No results found for this or any previous visit (from the past 48 hour(s)).  Medications:  Current Facility-Administered Medications  Medication Dose Route Frequency Provider Last Rate Last Admin   acetaminophen (TYLENOL) tablet 650 mg  650 mg Oral Q6H PRN Arby Barrette, MD   650 mg at 03/18/21 1450   LORazepam (ATIVAN) tablet 1 mg  1 mg Oral Once Horton, Kristie M, DO       OLANZapine zydis (ZYPREXA) disintegrating tablet 10 mg  10 mg Oral QHS Plunkett, [place], MD   10 mg at 03/19/21 0143   QUEtiapine (SEROQUEL) tablet 25 mg  25 mg Oral BID Ophelia Shoulder E, NP   25 mg at 03/19/21 1410   Current Outpatient Medications  Medication Sig Dispense Refill   amLODipine (NORVASC) 5 MG tablet TAKE 1 TABLET (5 MG TOTAL) BY MOUTH DAILY. (Patient not taking: Reported on 03/14/2021) 90 tablet 1   meclizine (ANTIVERT) 25 MG tablet TAKE 1 TABLET (25 MG TOTAL) BY MOUTH 2 (TWO)  TIMES DAILY AS NEEDED FOR DIZZINESS. (Patient not taking: Reported on 03/14/2021) 30 tablet 1   memantine (NAMENDA) 10 MG tablet Take 1 tablet (10 mg total) by mouth 2 (two) times daily. (Patient not taking: Reported on 03/14/2021) 30 tablet 11   metoprolol succinate (TOPROL-XL) 25 MG 24 hr tablet TAKE 1 TABLET (25 MG TOTAL) BY MOUTH DAILY. (Patient not taking: Reported on 03/14/2021) 90 tablet 1   QUEtiapine (SEROQUEL) 25 MG tablet Take 1 tablet at night for agitation, paranoia (Patient not taking: Reported on 03/14/2021) 30 tablet 3   rosuvastatin (CRESTOR) 40 MG tablet TAKE 1 TABLET (40 MG TOTAL) BY MOUTH DAILY. (Patient not taking: Reported on 03/14/2021) 90 tablet 1    Musculoskeletal: Strength & Muscle Tone:  Unable to assess Gait & Station:  Did not see patient ambulate Patient leans: N/A    Psychiatric Specialty Exam:  Unable to complete PSE 03/19/21.  Information below is PSE from 03/16/21  Presentation  General Appearance: Appropriate for Environment  Eye Contact:Fair  Speech:Clear and Coherent; Blocked (a little slow)  Speech Volume:Decreased  Handedness:Right   Mood and Affect  Mood:Dysphoric  Affect:Flat   Thought Process  Thought Processes:Disorganized; Linear  Descriptions of Associations:Circumstantial  Orientation:Partial (Oriented to self)  Thought Content:Logical  History of Schizophrenia/Schizoaffective disorder:No  Duration of Psychotic Symptoms:No data recorded Hallucinations:No data recorded  Ideas of Reference:Delusions (Possible some delusional thinking)  Suicidal Thoughts:No data recorded  Homicidal Thoughts:No data recorded   Sensorium  Memory:Immediate Poor; Recent Poor; Remote Poor  Judgment:Impaired  Insight:Lacking   Executive Functions  Concentration:Poor  Attention Span:Poor  Recall:Poor  Fund of Knowledge:Poor  Language:Poor (Interpreter used during assessment.  Primary language Spanish)   Psychomotor Activity   Psychomotor Activity:No data recorded   Assets  Assets:Resilience; Social Support; Desire for Improvement   Sleep  Sleep:No data recorded    Physical Exam: Physical Exam Vitals and nursing note reviewed. Exam conducted with a chaperone present Psychiatrist).  Constitutional:      General: He is not in acute distress.    Appearance: Normal appearance. He is not ill-appearing.  Cardiovascular:     Rate and Rhythm: Normal rate.  Pulmonary:     Effort: Pulmonary effort is normal.  Neurological:     Mental Status: He is alert.     Comments: Oriented to self  Psychiatric:        Speech: Speech is delayed.        Behavior: Behavior is cooperative.        Thought Content: Delusional: Possible some delusional thinking.        Cognition and Memory: Cognition is impaired. Memory is impaired.   Review of Systems  Unable to perform ROS: Acuity of condition (Patient unable to answer most questions)  Psychiatric/Behavioral:  Hallucinations: Denies. Memory loss: There are some memory issues. Suicidal ideas: Denies at this time but did cut his throat in a suicide attempt. Nervous/anxious: Stable.        Patient unable to participate in assessment related to sleepiness.  Difficult to wake for assessment.   Blood pressure (!) 148/105, pulse 85, temperature 97.7 F (36.5 C), temperature source Oral, resp. rate 16, SpO2 100 %. There is no height or weight on file to calculate BMI.  Treatment Plan Summary:  No change in treatment plan at this time Daily contact with patient to assess and evaluate symptoms and progress in treatment, Medication management, and Plan inpatient psychiatric treatment  Labs reviewed CBC/differential: Elevated WBC CMP: Decreased potassium, elevated creatinine decreased GFR No urinalysis obtained.  Will order.  Also ordered RPR, and [place]  Will order EKG to rule out prolong QTc with the start of psychotropic medication.  CT scan of head done  07/20/2020:  IMPRESSION: 1. No acute intracranial process. 2. Chronic hypodensities bilateral parieto-occipital periventricular white matter most consistent with chronic small vessel ischemic change  Medication Management: Patient started on Seroquel 25 mg twice daily on 03/14/2021.  Continue no changes at this time  Disposition: Recommend psychiatric Inpatient admission when medically cleared.  This service was provided via telemedicine using a 2-way, interactive audio and video technology.  Names of all persons participating in this telemedicine service and their role in this encounter. Name: Assunta Found  Role: NP  Name: Dr. Nelly Rout Role: Psychiatrist  Name: Linard Millers  Role: Patient  Name:  Role:    Will continue to seek inpatient psychiatric treatment.   Desyre Calma  Kaelen Caughlin, NP 03/19/2021 3:18 PM

## 2021-03-19 NOTE — ED Notes (Signed)
Pt woke up and wants to walk. Pt unable to follow commands in using walker, pt is not steady on feet. Instructed pt to sit on the side of the bed. Sitter at bedside.

## 2021-03-19 NOTE — ED Notes (Signed)
Sitter arrived, soft restraints removed. Pt is sleeping.

## 2021-03-20 MED ORDER — LORAZEPAM 2 MG/ML IJ SOLN
0.5000 mg | Freq: Once | INTRAMUSCULAR | Status: AC
  Administered 2021-03-20: 0.5 mg via INTRAMUSCULAR
  Filled 2021-03-20: qty 1

## 2021-03-20 NOTE — ED Notes (Signed)
Placed Breakfast orders 

## 2021-03-20 NOTE — ED Notes (Signed)
Pt resting. Even Resp. No distress noted at this time. Sitter at bedside. 

## 2021-03-20 NOTE — ED Notes (Signed)
Pt is becoming more agitated and is attempting to get out of bed.

## 2021-03-20 NOTE — ED Notes (Signed)
No sitter as of 0300

## 2021-03-20 NOTE — ED Notes (Signed)
Pt resting. Even Resp. No distress noted at this time. Sitter at bedside.

## 2021-03-20 NOTE — ED Notes (Signed)
Pt continues attempting to get out of bed. 

## 2021-03-20 NOTE — ED Notes (Signed)
Lunch ordered 

## 2021-03-20 NOTE — Progress Notes (Signed)
Patient has been recommended for geri-psych and has been faxed out. Patient meets inpatient criteria per [facility]. Patient referred to the following facilities:  [facility]  [address], [place] [address] [phone]  [facility] [address]  [address], [place] [address] [phone]  [facility]  [address] [place], [place] [address] [phone]  [facility]  [address] [place] [address] [phone]  [facility]  [address] [place], [place] [address] [phone]  [facility]  [address], [place] [address] [phone]  [facility]  [address], [place] [address] [phone]  [facility]  [address] [place] [address] [phone]  [facility]  [address] [place] [address] [phone]  [facility]  [address], [place] [address] [phone]  [facility]  [address], [place] [address] [phone]  [facility]  [address] [place] [address] [phone]  [facility]  [address] [place] [address] [phone]  [facility]  [address] [place], [place] [address] [phone]  [facility]  [address] [place] [address] [phone]  [facility]  [address], [place] [address] [phone]  [facility]  [address] [place] [address] [phone]  [facility]  [address] [place] [address] [phone]  [facility]  [address] [place] [address] [phone]    CSW will continue to monitor disposition.    Damita Dunnings, MSW, LCSW-A  2:52 PM 03/20/2021

## 2021-03-21 MED ORDER — ZIPRASIDONE MESYLATE 20 MG IM SOLR
20.0000 mg | Freq: Once | INTRAMUSCULAR | Status: AC
  Administered 2021-03-21: 20 mg via INTRAMUSCULAR
  Filled 2021-03-21: qty 20

## 2021-03-21 NOTE — Progress Notes (Addendum)
In attempt to secure placement, CSW contacted the following facilities w/ outcomes listed.   [facility] - Unable to reach admissions coordinator x2  [facility] - CSW spoke w/ admissions coordinator, states they will review referral w/ physician. CSW to follow up at a later time.   [facility] - Unable to reach admissions coordinator x2  [facility] - Per admission coordinator, Denied due to dx of dementia   [facility] - Unable to admit, facility currently at capacity.   [facility] - Unable to admit, facility currently at capacity.   [facility] - Unable to reach admissions coordinator x2  [facility] - Admissions coordinator requested referral be resent, sent again at this time and follow up. Update 03/22/21 DENIED due to age   [facility] - Unable to admit, facility at capacity.   [facility] [place] [place] [place] - CSW left voicemail with contact information requesting callback.  [facility] - Unable to reach admission coordinator x2  [facility] - CSW sent initial referral at this time, will follow up once reviewed by facility. [place] 03/22/21 Attempted to followup, no answer, will attempt again.   CSW will make additional attempts to reach facilities and monitor bed availability. Situation ongoing, CSW will continue to monitor and update note as more information becomes available.    Signed:  Corky Crafts, MSW, [place], LCASA 03/21/2021 12:42 PM

## 2021-03-21 NOTE — Progress Notes (Addendum)
Follow-up on Inpatient Behavioral Health Placement: Bed Referrals  @9 :14pm CSW contacted [facility] and spoke with Ashely with Intake [phone] and was advised that pt has been denied due to pt having wounds on the neck. Ashely reported that their facility is unable to manage any type of wound care. Ashely reported that pt would be able to be reviewed again once pt no longer sutures. CSW will continue to assist with linking pt with the appropriate placement.   @9 :26 CSW [phone] [facility] and spoke with intake team; CSW informed that they have not received pt's referral. CSW re-faxed referral to [phone].   @9 :29 CSW contacted Intake Coordinator with [facility] who advised that they have availability. CSW reviewed insurance coverage with Intake Coordinator and reported that pt has no insurance listed. Intake Coordinator advised CSW to fax referral, and CSW agreed.  @9 :48pm CSW sent fax to [facility] [phone].   CSW will continue to assist with securing potential bed offer for pt with inpatient behavioral health placement.     [facility], MSW, LCSWA 03/21/2021 9:35 PM

## 2021-03-21 NOTE — ED Notes (Signed)
Meal provided at the bedside 

## 2021-03-21 NOTE — ED Notes (Signed)
Pt caught standing on bed. This RN and sitter got the pt to lay back down. Pt is getting more restless and agitated.

## 2021-03-21 NOTE — ED Notes (Signed)
Pt continues attempting to get out of bed. Pt is also reaching and attempting to grab things that are not there.

## 2021-03-21 NOTE — ED Notes (Signed)
Patient repositioned in the bed. Food placed within reach, but patient refusing food and drink at this time.

## 2021-03-21 NOTE — ED Notes (Signed)
Pt was found on floor. Pt is alert and denies any pain. Dr. Posey Rea at bedside completing an assessment.

## 2021-03-21 NOTE — ED Notes (Signed)
Pt is removing his clothing.

## 2021-03-21 NOTE — ED Notes (Signed)
Pt attempted to jump over bedside rails. Pt is confused and incompressible speech.

## 2021-03-21 NOTE — ED Notes (Signed)
Changed pt brief.

## 2021-03-21 NOTE — ED Notes (Signed)
Meal is present at the bedside.

## 2021-03-21 NOTE — ED Notes (Signed)
Patient is sleeping  

## 2021-03-22 MED ORDER — STERILE WATER FOR INJECTION IJ SOLN
INTRAMUSCULAR | Status: AC
  Administered 2021-03-22: 10 mL
  Filled 2021-03-22: qty 10

## 2021-03-22 MED ORDER — ZIPRASIDONE MESYLATE 20 MG IM SOLR
10.0000 mg | Freq: Once | INTRAMUSCULAR | Status: AC
  Administered 2021-03-22: 10 mg via INTRAMUSCULAR
  Filled 2021-03-22: qty 20

## 2021-03-22 MED ORDER — LORAZEPAM 2 MG/ML IJ SOLN
1.0000 mg | Freq: Once | INTRAMUSCULAR | Status: AC
  Administered 2021-03-22: 1 mg via INTRAMUSCULAR
  Filled 2021-03-22: qty 1

## 2021-03-22 NOTE — Progress Notes (Addendum)
@  8:39pm Pt denied due dx of dementia at [facility]. CSW spoke with Admissions, Shanda Bumps.   @10 /8/22 12:11pm pt was denied by [facility] due to dx of dementia.   CSW will continue to assist and obtain a potential bed offer for appropriate inpatient behavioral health placement.    [facility], MSW, [facility] 03/22/2021 8:41 PM

## 2021-03-22 NOTE — ED Notes (Signed)
Pt continually trying to climb out the bed. Able to be redirected at this time

## 2021-03-22 NOTE — Consult Note (Signed)
Attempted to see patient via telepsychiatry visit, was informed Ocie Bob, RN patient was given prn geodon for agitation last night and remains asleep.  Will allow patient to rest and re-attempt assessment later today.

## 2021-03-22 NOTE — ED Provider Notes (Signed)
Emergency Medicine Observation Re-evaluation Note  Thomas Ortiz is a 65 y.o. male, seen on rounds today.  Pt initially presented to the ED for complaints of Dementia and Delusional Currently, the patient is calm and in NAD.  Physical Exam  BP (!) 144/104 (BP Location: Right Arm)   Pulse 100   Temp 98.1 F (36.7 C) (Oral)   Resp 18   SpO2 100%  Physical Exam General: Calm, resting Cardiac: well perfused Lungs: Respirations even and unlabored Psych: Calm, no distress  ED Course / MDM  EKG:   I have reviewed the labs performed to date as well as medications administered while in observation.  Recent changes in the last 24 hours include none.  Plan  Current plan is for placement, social work coordinating. Thomas Ortiz is under involuntary commitment.      Ernie Avena, MD 03/22/21 [phone]

## 2021-03-22 NOTE — ED Notes (Signed)
Pt in bed with eyes closed, pt mildly arouses to verbal stim, asked about toileting, informed him lunch was here, pt then went back to sleep

## 2021-03-22 NOTE — ED Notes (Signed)
Sent ivc paper work to [employer] , handing over to 3rd shift NS

## 2021-03-22 NOTE — ED Notes (Signed)
Pt in bed with eyes closed, sitter at bedside, resps even and unlabored. Pt opens eyes to verbal stim.

## 2021-03-22 NOTE — ED Notes (Signed)
Pt sitting up in bed, pt calm and working on his meal tray, sitter at bedside

## 2021-03-22 NOTE — ED Notes (Signed)
Pt in bed with eyes open, pt denies pain, pt has no requests, pt voided, cleaned pt and changed brief.

## 2021-03-22 NOTE — ED Notes (Signed)
Pt in bed with eyes closed, pt emitting a snoring like noise.  

## 2021-03-22 NOTE — ED Notes (Signed)
Pt continually trying to climb out of bed, becoming more difficult to redirect. Pt medicated and placed in hospital bed

## 2021-03-22 NOTE — ED Notes (Signed)
Pt ambulatory to the bathroom with unsteady gait and two person assist.  Pt back into bed, bed alarm on.

## 2021-03-22 NOTE — ED Notes (Signed)
Pt in bed emitting a snoring like noise.

## 2021-03-23 MED ORDER — STERILE WATER FOR INJECTION IJ SOLN
INTRAMUSCULAR | Status: AC
  Administered 2021-03-23: 1.2 mL
  Filled 2021-03-23: qty 10

## 2021-03-23 MED ORDER — ZIPRASIDONE MESYLATE 20 MG IM SOLR
10.0000 mg | Freq: Once | INTRAMUSCULAR | Status: AC | PRN
  Administered 2021-03-23: 10 mg via INTRAMUSCULAR
  Filled 2021-03-23: qty 20

## 2021-03-23 MED ORDER — LORAZEPAM 1 MG PO TABS: 1.0000 mg | ORAL_TABLET | Freq: Once | ORAL | Status: AC | PRN

## 2021-03-23 MED ORDER — LORAZEPAM 2 MG/ML IJ SOLN
1.0000 mg | Freq: Once | INTRAMUSCULAR | Status: AC | PRN
  Administered 2021-03-23: 1 mg via INTRAMUSCULAR
  Filled 2021-03-23: qty 1

## 2021-03-23 NOTE — Progress Notes (Addendum)
CSW provided the following resources for the patient or family of the patient to utilize to connect to further services in the community listed below:   Publishing rights manager  Address: [address] [place], [place] [address] Phone#: [phone]  [employer] [place] is a Dentist which promotes the independent living of older adults in [place].  Please visit the "Contact SeniorLine" page on this website and fill out the Contact Form if you would like to reach [employer] through the website. The Contact Form lists many of the topics of interest to seniors and their caregivers, including medical and long-term care insurance and prescription drug assistance.  Short-term case assistance is available to senior adults and their families who require additional assistance identifying and accessing services to meet their needs.  Rosaland Lao, Engineer, mining From [place]: [phone]  Other Resources: [facility]  24-hour Call Center: [phone]  Behavioral Health Crisis Lines:  Other Crisis Lines listed below: [phone] (text line) [phone] [facility] Crisis)

## 2021-03-23 NOTE — Discharge Instructions (Addendum)
[facility] [place]  Address: [address] [place], [place] [address] Phone#: [phone]  [employer] [place] [employer] is a Dentist which promotes the independent living of older adults in [place].  Please visit the "Contact SeniorLine" page on this website and fill out the Contact Form if you would like to reach [facility] through the website. The Contact Form lists many of the topics of interest to seniors and their caregivers, including medical and long-term care insurance and prescription drug assistance.  Short-term case assistance is available to senior adults and their families who require additional assistance identifying and accessing services to meet their needs.  Rosaland Lao, Engineer, mining From [place]: [phone]  Other Resources: [facility]  24-hour Call Center: [phone]  Behavioral Health Crisis Lines:  Other Crisis Lines listed below: [phone] (text line) [phone] [facility] Crisis)

## 2021-03-23 NOTE — Progress Notes (Signed)
CSW followed up with Thomas Ortiz with [facility]. Patient is under review.   Avree Szczygiel, MSW, LCSW-A, LCAS-A Phone: [phone] Disposition/TOC   

## 2021-03-23 NOTE — ED Notes (Signed)
Patient didn't want his breakfast

## 2021-03-23 NOTE — ED Notes (Signed)
Daughter Marjean Donna [phone] would like an update

## 2021-03-23 NOTE — ED Notes (Signed)
For some reason the vitals entered by the sitter/tech show as a post fall assessment though she is entering them in the V/S flowsheet.    For the record this pt has NOT fallen today.

## 2021-03-23 NOTE — Consult Note (Addendum)
Telepsych Consultation   Reason for Consult:  Psychiatric Reassessment Referring Physician:  EDP Location of Patient:   [facility] Location of Provider: Other: virtual home visit  Patient Identification: Thomas Ortiz MRN:  [id number] Principal Diagnosis: Major depressive disorder, recurrent, severe with psychotic symptoms (HCC) Diagnosis:  Principal Problem:   Major depressive disorder, recurrent, severe with psychotic symptoms (HCC) Active Problems:   Suicide attempt (HCC)   Total Time spent with patient: 30 minutes  Subjective:   Thomas Ortiz is a 65 y.o. male patient admitted with self harming behaviors; hx of dementia.    Patient seen via telepsych by this provider; chart reviewed and consulted with Dr. Lucianne Muss on 03/23/21.  On evaluation Thomas Ortiz today, patient is seen laying in bed with his eyes closed.  Per sitter who in is the room with him, he did not sleep good last night and recently able to fall asleep.  Patient's psychiatric assessment was deferred yesterday due to similar circumstances, continued to attempt assessment today. Patient laying in bed, looks at the camera when called by name but does not respond to questions. Appears he wants to go to sleep as he is seen lowering his bed and pulling the covers over himself. Most of the information obtained below is per record review and nursing input.    Patient was admitted on 03/13/2021 for dementia and self-harming behaviors.  He was recommended for inpatient geri psych but remained in the ED awaiting placement.  Since admission, his home medication, seroquel was increased to 25mg  po BID; and he was started on olanzapine 5mg  po daily which was titrated slowly to 10mg  po daily to promote mood stability.  At baseline he continues with confusion and has required staff assistance with ADLs.  His appetite is good and sleep has been somewhat sporadic, appears mostly due to confusion and he has required prn medications for agitation.   Patient sleeps during the day and is up at night. For the past 10 days, he has not attempted any self harming behaviors, and no assaulting behaviors demonstrated towards hospital staff.  Although he did not participate in the assessment today, the sitter caring for him today states he told her thank you when she gave him a blanket which demonstrates situational awareness.   Patient appears at his baseline, he has dementia, with periodic behavioral disturbances. Unfortunately, this is not curable and paitent should be expected to have episodic/periodic behavioral symptoms.    I have attempted to reach his daughter, at [phone] to review the plan of care with her and answer questions.  Unfortunately, the # is not in services.   HPI:  Per Admission Assessment 03/13/2021: History No chief complaint on file.     Thomas Ortiz is a 65 y.o. male.   65 y.o male with a PMH of HTN, Vertigo, presents to the ED with a chief complaint of low neck laceration after a self inflicted wound. Patient reports he was at home where he resides with his daughter, she was told that he could not stay in the house and was kicked out of the house.  He apparently walked from his daughter's house to our MAU department.  He had to use a knife to cut his throat, reports this is not his previous attempt.  He continues to feel suicidal at this time, and states to [employer] "I will do it again if they kick me out". He reports pain to the area, dressing was applied by MAU. Patient endorses SI,  no HI, visual or auditory hallucinations.    The history is provided by the patient and medical records.   Past Psychiatric History: Dementia  Risk to Self:  no Risk to Others:  no Prior Inpatient Therapy: no  Prior Outpatient Therapy:  no  Past Medical History:  Past Medical History:  Diagnosis Date   Hypertension 2007    Past Surgical History:  Procedure Laterality Date   bilateral foot sx  2011   CHOLECYSTECTOMY N/A  01/30/2015   Procedure: LAPAROSCOPIC CHOLECYSTECTOMY WITH INTRAOPERATIVE CHOLANGIOGRAM;  Surgeon: Manus Rudd, MD;  Location: [facility] OR;  Service: General;  Laterality: N/A;   COLONOSCOPY N/A 08/17/2015   Procedure: COLONOSCOPY;  Surgeon: West Bali, MD;  Location: [facility];  Service: Endoscopy;  Laterality: N/A;  1:45 PM   Family History: History reviewed. No pertinent family history. Family Psychiatric  History: unknown Social History:  Social History   Substance and Sexual Activity  Alcohol Use No   Alcohol/week: 2.0 standard drinks   Types: 2 Cans of beer per week   Comment: occasionally     Social History   Substance and Sexual Activity  Drug Use No    Social History   Socioeconomic History   Marital status: Married    Spouse name: Not on file   Number of children: Not on file   Years of education: Not on file   Highest education level: Not on file  Occupational History   Not on file  Tobacco Use   Smoking status: Former   Smokeless tobacco: Never  Vaping Use   Vaping Use: Never used  Substance and Sexual Activity   Alcohol use: No    Alcohol/week: 2.0 standard drinks    Types: 2 Cans of beer per week    Comment: occasionally   Drug use: No   Sexual activity: Not on file  Other Topics Concern   Not on file  Social History Narrative   Right handed    Lives with family    Social Determinants of Health   Financial Resource Strain: Not on file  Food Insecurity: Not on file  Transportation Needs: Not on file  Physical Activity: Not on file  Stress: Not on file  Social Connections: Not on file   Additional Social History:    Allergies:  No Known Allergies  Labs: No results found for this or any previous visit (from the past 48 hour(s)).  Medications:  Current Facility-Administered Medications  Medication Dose Route Frequency Provider Last Rate Last Admin   acetaminophen (TYLENOL) tablet 650 mg  650 mg Oral Q6H PRN Arby Barrette, MD   650 mg at  03/18/21 1450   LORazepam (ATIVAN) tablet 1 mg  1 mg Oral Once PRN Bobbitt, Shalon E, NP       Or   LORazepam (ATIVAN) injection 1 mg  1 mg Intramuscular Once PRN Bobbitt, Shalon E, NP       OLANZapine zydis (ZYPREXA) disintegrating tablet 10 mg  10 mg Oral QHS Plunkett, Whitney, MD   10 mg at 03/22/21 2151   QUEtiapine (SEROQUEL) tablet 25 mg  25 mg Oral BID Ophelia Shoulder E, NP   25 mg at 03/22/21 2151   Current Outpatient Medications  Medication Sig Dispense Refill   amLODipine (NORVASC) 5 MG tablet TAKE 1 TABLET (5 MG TOTAL) BY MOUTH DAILY. (Patient not taking: Reported on 03/14/2021) 90 tablet 1   meclizine (ANTIVERT) 25 MG tablet TAKE 1 TABLET (25 MG TOTAL) BY MOUTH 2 (  TWO) TIMES DAILY AS NEEDED FOR DIZZINESS. (Patient not taking: Reported on 03/14/2021) 30 tablet 1   memantine (NAMENDA) 10 MG tablet Take 1 tablet (10 mg total) by mouth 2 (two) times daily. (Patient not taking: Reported on 03/14/2021) 30 tablet 11   metoprolol succinate (TOPROL-XL) 25 MG 24 hr tablet TAKE 1 TABLET (25 MG TOTAL) BY MOUTH DAILY. (Patient not taking: Reported on 03/14/2021) 90 tablet 1   QUEtiapine (SEROQUEL) 25 MG tablet Take 1 tablet at night for agitation, paranoia (Patient not taking: Reported on 03/14/2021) 30 tablet 3   rosuvastatin (CRESTOR) 40 MG tablet TAKE 1 TABLET (40 MG TOTAL) BY MOUTH DAILY. (Patient not taking: Reported on 03/14/2021) 90 tablet 1    Musculoskeletal: Strength & Muscle Tone: within normal limits Gait & Station: normal Patient leans: N/A    Psychiatric Specialty Exam:  Presentation  General Appearance: Casual  Eye Contact:Minimal  Speech:Clear and Coherent; Blocked (a little slow)  Speech Volume:Normal  Handedness:Right   Mood and Affect  Mood:-- (appears tired, he pulls the covers over his head and lays down)  Affect:Congruent; Constricted   Thought Process  Thought Processes:Goal Directed  Descriptions of Associations:-- (patient does not respond to  questions)  Orientation:Partial (Oriented to self)  Thought Content:Logical  History of Schizophrenia/Schizoaffective disorder:No  Duration of Psychotic Symptoms:No data recorded Hallucinations:Hallucinations: None  Ideas of Reference:None  Suicidal Thoughts:Suicidal Thoughts: No  Homicidal Thoughts:Homicidal Thoughts: No   Sensorium  Memory:-- (unable to determine)  Judgment:Impaired (at baseline as patient has dementia)  Insight:Lacking   Executive Functions  Concentration:Fair  Attention Span:Fair  Recall:Fair  Fund of Knowledge:-- (unable to establish)  Language:Fair   Psychomotor Activity  Psychomotor Activity:Psychomotor Activity: Normal   Assets  Assets:Housing; Social Support   Sleep  Sleep:Sleep: Poor Number of Hours of Sleep: 4    Physical Exam: Physical Exam Constitutional:      Appearance: Normal appearance.  HENT:     Head: Normocephalic.  Cardiovascular:     Rate and Rhythm: Normal rate.     Pulses: Normal pulses.  Pulmonary:     Effort: Pulmonary effort is normal.  Musculoskeletal:        General: Normal range of motion.     Cervical back: Normal range of motion.  Neurological:     General: No focal deficit present.     Mental Status: He is alert.   Review of Systems  HENT: Negative.    Eyes: Negative.   Respiratory: Negative.    Cardiovascular: Negative.   Gastrointestinal: Negative.   Genitourinary: Negative.   Musculoskeletal: Negative.   Skin: Negative.   Neurological: Negative.   Endo/Heme/Allergies: Negative.   Psychiatric/Behavioral:  Positive for memory loss. Negative for depression, substance abuse and suicidal ideas. The patient has insomnia (usually sleeps during the day).   Blood pressure (!) 142/97, pulse 92, temperature (!) 97.5 F (36.4 C), temperature source Axillary, resp. rate 18, SpO2 99 %. There is no height or weight on file to calculate BMI.  Treatment Plan Summary: Patient appears at his  baseline, he has dementia, with periodic behavioral disturbances. Unfortunately, this is not curable and paitent should be expected to have episodic/periodic behavioral symptoms.    Transition of Care Consult entered to evaluate for in home health resources to assist with safety planning.Patient does not have medical insurance so also requesting assistance in initiating services for him.    In the interim, patient can follow-up with [facility] for management of psychiatric medications.  He should continue olanzapine 10mg   po qhs and seroquel 25mg  po BID for mood stability.  Will need assistance in paying for rx.   Disposition: No evidence of imminent risk to self or others at present.   Patient does not meet criteria for psychiatric inpatient admission. Supportive therapy provided about ongoing stressors. Discussed crisis plan, support from social network, calling 911, coming to the Emergency Department, and calling Suicide Hotline.  This service was provided via telemedicine using a 2-way, interactive audio and video technology.  Names of all persons participating in this telemedicine service and their role in this encounter. Name: Role: Patient  Name: [name] Role: PMHNP    Ophelia Shoulder, NP 03/23/2021 8:31 AM

## 2021-03-23 NOTE — BH Assessment (Signed)
This Probation officer met with patient this date to assess current mental health status after it was noted by provider that patient was seen by telepsych and did not respond. This writer attempted to arouse patient unsuccessfully. Patient was observed to open his eyes although would not respond to writer's questions. It is unclear if patient is processing the content of questions. Per notes this appears to be patient's baseline. Per Jerelene Redden NP patient does not meet criteria for a psychiatric admission. Per CSW note as of earlier this date they are assisting with placement issues.

## 2021-03-23 NOTE — TOC Initial Note (Signed)
Transition of Care [facility]) - Initial/Assessment Note    Patient Details  Name: Thomas Ortiz MRN: [id number] Date of Birth: 1955-08-05  [facility] CM/SW Contact:    Lockie Pares, RN Phone Number: 03/23/2021, 10:07 AM  Clinical Narrative:                 Patient presented with increasing agitation, dementia uninsured. Psyichiatry and CSW looking for IP placement, IVC is in effect.         Patient Goals and CMS Choice        Expected Discharge Plan and Services  IP gero                                              Prior Living Arrangements/Services                       Activities of Daily Living      Permission Sought/Granted                  Emotional Assessment              Admission diagnosis:  throat injury Patient Active Problem List   Diagnosis Date Noted   Major depressive disorder, recurrent, severe with psychotic symptoms (HCC) 03/16/2021   Suicide attempt (HCC) 03/16/2021   Involuntary commitment    Dementia without behavioral disturbance (HCC)    Severe episode of recurrent major depressive disorder, without psychotic features (HCC)    Hyperlipidemia 10/06/2018   Osteoarthritis 01/11/2018   Bradycardia 11/20/2017   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 [place] [address] Phone: [phone] Fax: [phone]     Social Determinants of Health (SDOH) Interventions Depression Interventions/Treatment : Referral to Psychiatry  Readmission Risk Interventions No flowsheet data found.

## 2021-03-23 NOTE — Consult Note (Signed)
Several attempts to reach patient's daughter, Janice Norrie to review the discharge plan of care.  # not in service.

## 2021-03-24 MED ORDER — QUETIAPINE FUMARATE 25 MG PO TABS
25.0000 mg | ORAL_TABLET | Freq: Two times a day (BID) | ORAL | Status: DC
  Administered 2021-03-24: 25 mg via ORAL
  Filled 2021-03-24: qty 1

## 2021-03-24 MED ORDER — QUETIAPINE FUMARATE 25 MG PO TABS: 25.0000 mg | ORAL_TABLET | Freq: Two times a day (BID) | ORAL | 0 refills | Status: DC

## 2021-03-24 MED ORDER — OLANZAPINE 10 MG PO TABS: 10.0000 mg | ORAL_TABLET | Freq: Every day | ORAL | 0 refills | Status: DC

## 2021-03-24 NOTE — ED Notes (Signed)
IVC rescinded. EDP at [facility]. Anterior neck wound healing well, sutures removed per Dr. Effie Shy. PTAR contacted for transport.

## 2021-03-24 NOTE — ED Provider Notes (Signed)
Emergency Medicine Observation Re-evaluation Note 9:27 AM  Thomas Ortiz is a 65 y.o. male, seen on rounds today.  Pt initially presented to the ED for complaints of Dementia and Delusional Currently, the patient is resting, no distress.  Physical Exam  BP (!) 128/105 (BP Location: Right Arm)   Pulse 83   Temp 97.7 F (36.5 C) (Axillary)   Resp 16   SpO2 98%  Physical Exam General: Resting, awakens easily Cardiac: Regular rate and rhythm Lungs: No increased work of breathing Psych: Calm, no distress  ED Course / MDM  EKG:   I have reviewed the labs performed to date as well as medications administered while in observation.  Recent changes in the last 24 hours include none.  Plan  Current plan is for social work assistance with disposition. Thomas Ortiz is under involuntary commitment.      Gerhard Munch, MD 03/24/21 [phone]

## 2021-03-24 NOTE — TOC Initial Note (Addendum)
Transition of Care [facility]) - Initial/Assessment Note    Patient Details  Name: Thomas Ortiz MRN: [id number] Date of Birth: 1955-08-27  Transition of Care [facility]) CM/SW Contact:    Lockie Pares, RN Phone Number: 03/24/2021, 4:26 PM  Clinical Narrative:                  Patient presented to the ED with dementia, delusions, he was assessed by psychiatry services,  The patient is seen at [facility], he is without insurance. Calls to his SO/Family go unanswered. He was to placed in geropsych, but he has lack of insurance and did not get accepted to a facility. Psych services then cleared him,  PT evaluation placed for further assessment, he remains here in the ED for further recommendations, as we are unable to get a hold of family, and he is not safe to be by himself.  He currently has a Comptroller at bedside.     Called patients listed home number left confidential voice message, called patient cell ohone no longer in service, called patient contact phone, no longer in service [phone] [employer] called for RW and [employer] gave them daughters number which is [phone]. CSW got in touch with her and she wants him home.    Patient Goals and CMS Choice        Expected Discharge Plan and Services   In-house Referral: Clinical Social Work Discharge Planning Services: CM Consult   Living arrangements for the past 2 months: Single Family Home                                      Prior Living Arrangements/Services Living arrangements for the past 2 months: Single Family Home Lives with:: Significant Other Patient language and need for interpreter reviewed:: Yes (Spanish SPeaking)        Need for Family Participation in Patient Care: Yes (Comment) Care giver support system in place?:  (unable to reach family)   Criminal Activity/Legal Involvement Pertinent to Current Situation/Hospitalization: No - Comment as needed  Activities of Daily Living       Permission Sought/Granted                  Emotional Assessment           Psych Involvement: No (comment)  Admission diagnosis:  throat injury Patient Active Problem List   Diagnosis Date Noted   Major depressive disorder, recurrent, severe with psychotic symptoms (HCC) 03/16/2021   Suicide attempt (HCC) 03/16/2021   Involuntary commitment    Dementia without behavioral disturbance (HCC)    Severe episode of recurrent major depressive disorder, without psychotic features (HCC)    Hyperlipidemia 10/06/2018   Osteoarthritis 01/11/2018   Bradycardia 11/20/2017   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 [place] [address] Phone: [phone] Fax: [phone]     Social Determinants of Health (SDOH) Interventions Depression Interventions/Treatment : Referral to Psychiatry  Readmission Risk Interventions No flowsheet data found.

## 2021-03-24 NOTE — ED Notes (Addendum)
Pt sleeping most of the day. Woke for lunch , as well as for VS. Woke startled needing to void, attempted to walk out of room to get to b/r with urge incontinence, unable to make it w/o urinating on floor. Assisted to [facility]. Urine sample saved. After staff speaking to pt in [place] and [place] (staff bilingual), pt appears lost, confused, embarrassed, scared and sad. Tearful. Admits to hunger. Some word salad noted. Denies pain. Speech slow and hesitant. Was not aware that he was in the hospital/ ED. Re-oriented. Given food. Gown changed.

## 2021-03-24 NOTE — ED Notes (Signed)
Sleeping since shower. NAD, calm. Sitter present.

## 2021-03-24 NOTE — ED Notes (Signed)
Dinner Ordered 

## 2021-03-24 NOTE — Progress Notes (Signed)
CSW attempted to contact the daughter the patient, Thomas Ortiz/Phone#: [phone] at the provider's request, however was unable to reach her or leave a voicemail message to update her on discharge recommendation for her father.  Thomas Ortiz, MSW, LCSW-A, LCAS-A Phone: [phone] Disposition/TOC

## 2021-03-24 NOTE — ED Notes (Signed)
SW/TOC at [facility] now facilitating face-time with daughter

## 2021-03-24 NOTE — ED Notes (Signed)
Lunch ordered 

## 2021-03-24 NOTE — ED Notes (Signed)
Pt to be d/c'd. Pending rescinding of the IVC. EDP aware. Will initiate PTAR when d/c ready.

## 2021-03-24 NOTE — ED Notes (Signed)
Up to [facility] with 2 person assist

## 2021-03-24 NOTE — ED Provider Notes (Signed)
6:04 PM-TOC has been able to contact the patient's daughter who excepts him back home.  I entered home health services for assessment and management to assist with his care.  Patient has been here since 03/13/2021.  He had a self-inflicted neck laceration at that time.  It was sutured.  He has been seen by psychiatry and cleared.  Patient is alert and cooperative.  Neck wound, anterior midline is well approximated with sutures still present.  These were placed on 03/13/2021.  The wound is healing without swelling, bleeding or drainage.  I asked nursing to remove the stitches prior to his discharge, today.  Patient will be going home by [facility].  He is to resume his usual at home treatments including medications.  I am recinding his IVC.   Mancel Bale, MD 03/24/21 [phone]

## 2021-03-24 NOTE — ED Notes (Signed)
[facility] called to transport patient, patient is 4th on the list

## 2021-03-25 MED ORDER — ZIPRASIDONE MESYLATE 20 MG IM SOLR
20.0000 mg | Freq: Once | INTRAMUSCULAR | Status: AC
  Administered 2021-03-25: 20 mg via INTRAMUSCULAR
  Filled 2021-03-25: qty 20

## 2021-03-25 NOTE — ED Notes (Signed)
Patient assisted in vehicle and discharged home with daughter.

## 2021-03-25 NOTE — ED Notes (Signed)
Pt daughter called and stated that they did not hear the door when patient was brought by [facility]. States that she is unable to come and get him herself, when asked if someone would be available in the morning if we are able to get him another ride with [facility], phone disconnected. No answer given.

## 2021-03-25 NOTE — ED Notes (Signed)
Patient has departed the facility with [facility] and enroute home. Day Nurse Christiane Ha communicated reception to daughter. Aware of patient return. Myself attempted as well (at time of note). Message left.

## 2021-03-25 NOTE — ED Notes (Signed)
Spoke to daughter on phone. Enroute now to receive patient via personal vehicle.

## 2021-03-25 NOTE — ED Notes (Signed)
[facility] has returned the patient after attempting home return. No one available to answer the door.

## 2021-04-10 ENCOUNTER — Telehealth: Payer: Self-pay | Admitting: Physician Assistant

## 2021-04-10 ENCOUNTER — Ambulatory Visit: Payer: Self-pay | Attending: Physician Assistant | Admitting: Physician Assistant

## 2021-04-10 ENCOUNTER — Other Ambulatory Visit: Payer: Self-pay

## 2021-04-10 ENCOUNTER — Encounter: Payer: Self-pay | Admitting: Physician Assistant

## 2021-04-10 VITALS — BP 123/79 | HR 62 | Resp 16 | Wt 130.8 lb

## 2021-04-10 DIAGNOSIS — E876 Hypokalemia: Secondary | ICD-10-CM

## 2021-04-10 DIAGNOSIS — F0390 Unspecified dementia without behavioral disturbance: Secondary | ICD-10-CM

## 2021-04-10 DIAGNOSIS — R45851 Suicidal ideations: Secondary | ICD-10-CM

## 2021-04-10 DIAGNOSIS — Z1331 Encounter for screening for depression: Secondary | ICD-10-CM

## 2021-04-10 DIAGNOSIS — Z09 Encounter for follow-up examination after completed treatment for conditions other than malignant neoplasm: Secondary | ICD-10-CM

## 2021-04-10 DIAGNOSIS — H539 Unspecified visual disturbance: Secondary | ICD-10-CM

## 2021-04-10 MED ORDER — SERTRALINE HCL 50 MG PO TABS
50.0000 mg | ORAL_TABLET | Freq: Every day | ORAL | 3 refills | Status: DC
Start: 2021-04-10 — End: 2024-02-15
  Filled 2021-04-10 – 2021-07-24 (×3): qty 30, 30d supply, fill #0

## 2021-04-10 NOTE — Patient Instructions (Addendum)
Make appt as soon as possible  Thomas Ortiz(neurology(for memory issues) Address: [address], [place], [place] [address] Phone: [phone]  With your new media=cation, take 1/2 tab daily for one week.  Increase dose to 1 tab daily after 1 week if you are tolerating it ok.     Sentimientos suicidas: cmo ayudarse a s mismo Suicidal Feelings: How to Help Yourself El suicidio es cuando uno pone fin a su propia vida. Hay muchas cosas que puede hacer para ayudarse a s mismo a sentirse mejor cuando lucha contra estos sentimientos. Hay muchos servicios y personas disponibles para apoyarlos a usted y a Information systems manager sentimientos similares.  Si alguna vez siente que puede lastimarse o Physicist, medical a Economist, o tiene pensamientos de poner fin a su vida, busque ayuda de inmediato. Para obtener ayuda: Comunquese con el servicio de emergencias de su localidad (911 en los [place]). La lnea de asistencia de los servicios de salud y servicios humanos de [address] (211 en los [place]). Dirjase al servicio de urgencias ms cercano. Llame a una lnea directa para la prevencin del suicidio y hable con un consejero capacitado. Las siguientes lneas directas para la prevencin del suicidio estn disponibles en [place]: 1-800-273-TALK [phone]). 1-800-SUICIDE [phone]). [phone]. Esta es una lnea directa para hispanohablantes. [phone]. Esta es una lnea directa para usuarios de [place]. 1-866-4-U-TREVOR [phone]). Esta es una lnea directa para jvenes lesbianas, homosexuales, bisexuales, transexuales o que cuestionan su identidad sexual. Para obtener una lista de las lneas directas en [place], visite ItCheaper.dk.html Comunquese con un centro de crisis o un centro de prevencin del suicidio local. Para encontrar un centro de crisis o un centro de prevencin del suicidio  local: Llame al hospital, la clnica, la organizacin de servicios comunitarios, el centro de salud mental, el proveedor de servicios sociales o el departamento de salud locales. Solicite ayuda para conectarse con un centro de crisis. Para obtener una lista de los centros de crisis en [place], visite: suicidepreventionlifeline.org Para obtener una lista de los centros de crisis en [place], visite: suicideprevention.ca [employer] a sentirse mejor  Promtase a s mismo que no tomar medidas drsticas cuando tenga sentimientos suicidas. Recuerde los momentos en los que se sinti esperanzado. Muchas personas han superado pensamientos y sentimientos suicidas y usted [place]. Si ha tenido estos sentimientos antes, recurdese que puede superarlos nuevamente. [place] a familiares, amigos, maestros o consejeros cmo se siente. Trate de no separarse de aquellos que se preocupan por usted y desean ayudarlo. Hable con alguien [employer], [place] no sienta ganas de ser sociable. Las conversaciones en persona son mejores para ayudarlos a que entiendan sus sentimientos. Comunquese con un profesional de la salud mental y trabaje con esta persona de modo regular. [place] un plan de seguridad que pueda seguir durante una crisis. Incluya nmeros de telfonos de las lneas directas para la prevencin del suicidio, de profesionales de la salud mental y de amigos y familiares de confianza a los que puede llamar durante Radio broadcast assistant. Guarde estos nmeros en su telfono. Si est pensando en tomar muchos medicamentos, entrgueselos a alguien que pueda administrrselos segn lo indicado. Si toma antidepresivos y est preocupado porque podra tomar una sobredosis, informe a su mdico de modo que pueda indicarle medicamentos ms seguros. Trate de apegarse a sus rutinas y [address] un cronograma todos [place]. Haga del cuidado propio una prioridad. [place] una lista de objetivos realistas y tchelos una vez que  los  haya logrado. Los logros pueden Chief Operating Officer. Espere hasta que se sienta mejor antes de hacer cosas que le resulten difciles o desagradables. Haga cosas que siempre disfrut para distraerse de sus sentimientos. Intente leer un libro, o escuchar o tocar msica. Pasar tiempo al [employer], en la naturaleza, puede ayudar a que se sienta mejor. Siga estas instrucciones en su casa:  Visite a su mdico de cabecera todos los aos para realizarse una revisin mdica. Trabaje con un profesional de la salud mental segn lo sea necesario. Consuma una dieta bien equilibrada e ingiera comidas a intervalos regulares. Descanse mucho. Si puede, haga actividad fsica. Slo 30 minutos de ejercicio al da pueden ayudarlo a sentirse mejor. Use los medicamentos de venta libre y los recetados solamente como se lo haya indicado el mdico. Pregunte al profesional de salud mental sobre los posibles efectos secundarios de los medicamentos que est tomando. No consuma alcohol ni drogas, y saque estas sustancias de su casa. Saque armas, veneno, cuchillos y otros artculos mortales de su casa. Recomendaciones generales Mantenga el lugar donde vive bien iluminado. Cuando se sienta bien, escrbase una carta a usted mismo con sugerencias y palabras de apoyo que pueda leer cuando no se sienta bien. Recuerde que las dificultades de la vida pueden superarse con [place]. Las afecciones pueden tratarse y usted puede aprender conductas y formas de pensar que lo ayudarn. Dnde buscar ms informacin National Suicide Psychologist, sport and exercise (Prevencin Nacional del Suicidio): www.suicidepreventionlifeline.org Hopeline (Lnea Nacional de [place]): www.hopeline.com [employer] for Suicide Prevention (Fundacin Estadounidense para la prevencin del suicidio): https://www.ayers.com/ [employer] ([place]) (para jvenes [place], homosexuales, bisexuales, transexuales o que cuestionan su identidad sexual):  www.thetrevorproject.Dana Corporation of Mental Health (Instituto [place] de Salud Mental): http://www.wall.info/ Comunquese con un mdico si: Siente que es una carga para los dems. Se siente preocupado, enojado, vengativo o tiene cambios de humor extremos. Se ha apartado de su familia y amigos. Solicite ayuda de inmediato si: Habla acerca del suicidio o desea morir. Comienza a hacer planes sobre cmo suicidarse. Siente que no tiene motivos para vivir. Comienza a hacer planes para poner en orden sus asuntos, despedirse o deshacerse de sus posesiones. Siente culpa, vergenza o un dolor insoportable, y siente que no hay salida. Est consumiendo alcohol y drogas con frecuencia. Est teniendo conductas riesgosas que podran conducir a la [place]. Si tiene cualquiera de estos sntomas, [place] de inmediato. Llame a los servicios de emergencias, dirjase al departamento de emergencias o centro de crisis ms cercano, o llame a las lneas de ayuda para crisis de suicidio. Resumen El suicidio es cuando uno se quita su propia vida. Promtase a s mismo que no tomar medidas drsticas cuando tenga sentimientos suicidas. Cunteles a familiares, amigos, maestros o consejeros cmo se siente. Obtenga ayuda de inmediato si comienza a hacer planes sobre cmo suicidarse. Esta informacin no tiene Theme park manager el consejo del mdico. Asegrese de hacerle al mdico cualquier pregunta que tenga. Document Revised: 04/12/2020 Document Reviewed: 04/12/2020 Elsevier Patient Education  2022 [employer].

## 2021-04-10 NOTE — Progress Notes (Signed)
Patient ID: Thomas Ortiz, male   DOB: 05-Jun-1956, 65 y.o.   MRN: [id number]     Thomas Ortiz, is a 65 y.o. male  QIH:[id number]  OVF:[id number]  DOB - 1956-04-22  Chief Complaint  Patient presents with   Hospitalization Follow-up    ED       Subjective:   Thomas Ortiz is a 65 y.o. male here today for a follow up visit After being seen in the ED([facility]) 03/13/2021 for self-inflicted neck laceration, dementia, and delusion.  He was observed and cleared by psychiatry.  He has not been taking ANY meds.  His daughter is with him today and interpreting.  She has recently taken over his care from her brother.  Still having memory issues.    He is open to starting something for depression.  He admits to suicidal fleeting thoughts of "wishing I wasn't here."  Denies any plan or intent.  Daughter, her fiancee and her kids live with them.  Protective factors include family; family dispensing meds.  There are no guns in the home and kitchen knives have been removed.    Patient has No headache, No chest pain, No abdominal pain - No Nausea, No new weakness tingling or numbness, No Cough - SOB.  No problems updated.  ALLERGIES: No Known Allergies  PAST MEDICAL HISTORY: Past Medical History:  Diagnosis Date   Hypertension 2007    MEDICATIONS AT HOME: Prior to Admission medications   Medication Sig Start Date End Date Taking? Authorizing Provider  sertraline (ZOLOFT) 50 MG tablet Take 1 tablet (50 mg total) by mouth daily. 04/10/21  Yes Laverda Stribling M, PA-C  amLODipine (NORVASC) 5 MG tablet TAKE 1 TABLET (5 MG TOTAL) BY MOUTH DAILY. Patient not taking: Reported on 03/14/2021 07/17/20 07/17/21  Hoy Register, MD  meclizine (ANTIVERT) 25 MG tablet TAKE 1 TABLET (25 MG TOTAL) BY MOUTH 2 (TWO) TIMES DAILY AS NEEDED FOR DIZZINESS. Patient not taking: Reported on 03/14/2021 08/30/20 08/30/21  Hoy Register, MD  memantine (NAMENDA) 10 MG tablet Take 1 tablet (10 mg total) by mouth 2 (two) times  daily. Patient not taking: Reported on 03/14/2021 11/08/20   Marcos Eke, PA-C  metoprolol succinate (TOPROL-XL) 25 MG 24 hr tablet TAKE 1 TABLET (25 MG TOTAL) BY MOUTH DAILY. Patient not taking: Reported on 03/14/2021 07/17/20 07/17/21  Hoy Register, MD  OLANZapine (ZYPREXA) 10 MG tablet Take 1 tablet (10 mg total) by mouth at bedtime. 03/24/21   Mancel Bale, MD  QUEtiapine (SEROQUEL) 25 MG tablet Take 1 tablet at night for agitation, paranoia Patient not taking: Reported on 03/14/2021 11/08/20   Marcos Eke, PA-C  QUEtiapine (SEROQUEL) 25 MG tablet Take 1 tablet (25 mg total) by mouth 2 (two) times daily. 03/24/21   Mancel Bale, MD  rosuvastatin (CRESTOR) 40 MG tablet TAKE 1 TABLET (40 MG TOTAL) BY MOUTH DAILY. Patient not taking: Reported on 03/14/2021 07/17/20 07/17/21  Hoy Register, MD    ROS: Neg HEENT Neg resp Neg cardiac Neg GI Neg GU Neg MS Neg psych Neg neuro  Objective:   Vitals:   04/10/21 0856  BP: 123/79  Pulse: 62  Resp: 16  SpO2: 99%  Weight: 130 lb 12.8 oz (59.3 kg)   Exam General appearance : Awake, alert, not in any distress. Speech Clear. Not toxic looking;  appears older than stated age and in poor health HEENT: Atraumatic and Normocephalic Neck: Supple, no JVD. No cervical lymphadenopathy.  Chest: Good air entry bilaterally, CTAB.  No rales/rhonchi/wheezing  CVS: S1 S2 regular, no murmurs.  Extremities: B/L Lower Ext shows no edema, both legs are warm to touch Neurology: Awake alert, and oriented X 3, CN II-XII intact, Non focal Skin: No Rash  Data Review Lab Results  Component Value Date   HGBA1C 5.6 07/17/2020   HGBA1C 5.70 07/05/2015    Assessment & Plan   1. Hypokalemia - Comprehensive metabolic panel  2. Dementia without behavioral disturbance (HCC) - Comprehensive metabolic panel  3. Suicidal ideation Refer to [employer] LCSW With your new media=cation, take 1/2 tab daily for one week.  Increase dose to 1 tab daily after 1  week if you are tolerating it ok.   4. Vision changes Not new - Ambulatory referral to Ophthalmology  5. Positive depression screening - sertraline (ZOLOFT) 50 MG tablet; Take 1 tablet (50 mg total) by mouth daily.  Dispense: 30 tablet; Refill: 3  6. Encounter for examination following treatment at hospital    Patient have been counseled extensively about nutrition and exercise. Other issues discussed during this visit include: low cholesterol diet, weight control and daily exercise, foot care, annual eye examinations at Ophthalmology, importance of adherence with medications and regular follow-up. We also discussed long term complications of uncontrolled diabetes and hypertension.   Return in about 6 weeks (around 05/22/2021) for for chronic conditions and med check.  The patient was given clear instructions to go to ER or return to medical center if symptoms don't improve, worsen or new problems develop. The patient verbalized understanding. The patient was told to call to get lab results if they haven't heard anything in the next week.      Georgian Co, PA-C [facility] [place], [place] [phone]   04/10/2021, 9:24 AM

## 2021-04-11 LAB — COMPREHENSIVE METABOLIC PANEL
ALT: 9 IU/L (ref 0–44)
AST: 13 IU/L (ref 0–40)
Albumin/Globulin Ratio: 2.2 (ref 1.2–2.2)
Albumin: 4.2 g/dL (ref 3.8–4.8)
Alkaline Phosphatase: 74 IU/L (ref 44–121)
BUN/Creatinine Ratio: 9 — ABNORMAL LOW (ref 10–24)
BUN: 11 mg/dL (ref 8–27)
Bilirubin Total: 0.3 mg/dL (ref 0.0–1.2)
CO2: 23 mmol/L (ref 20–29)
Calcium: 9.5 mg/dL (ref 8.6–10.2)
Chloride: 105 mmol/L (ref 96–106)
Creatinine, Ser: 1.2 mg/dL (ref 0.76–1.27)
Globulin, Total: 1.9 g/dL (ref 1.5–4.5)
Glucose: 92 mg/dL (ref 70–99)
Potassium: 4.3 mmol/L (ref 3.5–5.2)
Sodium: 142 mmol/L (ref 134–144)
Total Protein: 6.1 g/dL (ref 6.0–8.5)
eGFR: 67 mL/min/{1.73_m2} (ref >59)

## 2021-04-12 ENCOUNTER — Telehealth (INDEPENDENT_AMBULATORY_CARE_PROVIDER_SITE_OTHER): Payer: Self-pay

## 2021-04-12 NOTE — Telephone Encounter (Signed)
Call placed with assistance of pacific interpreter [phone]) patient is aware of normal labs. Maryjean Morn, CMA

## 2021-04-12 NOTE — Telephone Encounter (Signed)
-----   Message from Anders Simmonds, [place] sent at 04/11/2021  1:46 PM EDT ----- Your blood sugar, kidney function, and liver function are normal.  Follow up as planned.  Thanks, Georgian Co, PA-C

## 2021-05-21 NOTE — Telephone Encounter (Signed)
Pt has appt with me on 06/03/21

## 2021-05-22 ENCOUNTER — Telehealth: Payer: Self-pay

## 2021-05-22 ENCOUNTER — Other Ambulatory Visit: Payer: Self-pay

## 2021-05-22 ENCOUNTER — Ambulatory Visit: Payer: Self-pay | Attending: Family Medicine | Admitting: Family Medicine

## 2021-05-22 ENCOUNTER — Encounter: Payer: Self-pay | Admitting: Family Medicine

## 2021-05-22 VITALS — BP 151/91 | HR 60 | Ht 63.0 in | Wt 135.2 lb

## 2021-05-22 DIAGNOSIS — Z9151 Personal history of suicidal behavior: Secondary | ICD-10-CM | POA: Insufficient documentation

## 2021-05-22 DIAGNOSIS — F0390 Unspecified dementia without behavioral disturbance: Secondary | ICD-10-CM | POA: Insufficient documentation

## 2021-05-22 DIAGNOSIS — I10 Essential (primary) hypertension: Secondary | ICD-10-CM | POA: Insufficient documentation

## 2021-05-22 DIAGNOSIS — G8929 Other chronic pain: Secondary | ICD-10-CM | POA: Insufficient documentation

## 2021-05-22 DIAGNOSIS — H539 Unspecified visual disturbance: Secondary | ICD-10-CM

## 2021-05-22 DIAGNOSIS — J328 Other chronic sinusitis: Secondary | ICD-10-CM | POA: Insufficient documentation

## 2021-05-22 DIAGNOSIS — Z23 Encounter for immunization: Secondary | ICD-10-CM

## 2021-05-22 DIAGNOSIS — M79673 Pain in unspecified foot: Secondary | ICD-10-CM | POA: Insufficient documentation

## 2021-05-22 DIAGNOSIS — R0789 Other chest pain: Secondary | ICD-10-CM | POA: Insufficient documentation

## 2021-05-22 DIAGNOSIS — M25569 Pain in unspecified knee: Secondary | ICD-10-CM | POA: Insufficient documentation

## 2021-05-22 MED ORDER — FLUTICASONE PROPIONATE 50 MCG/ACT NA SUSP
2.0000 | Freq: Every day | NASAL | 1 refills | Status: AC
  Filled 2021-05-22 – 2021-07-24 (×3): qty 16, 30d supply, fill #0

## 2021-05-22 MED ORDER — CETIRIZINE HCL 10 MG PO TABS
10.0000 mg | ORAL_TABLET | Freq: Every day | ORAL | 1 refills | Status: DC
  Filled 2021-05-22: qty 30, 30d supply, fill #0

## 2021-05-22 NOTE — Progress Notes (Signed)
Subjective:  Patient ID: Thomas Ortiz, male    DOB: April 18, 1956  Age: 65 y.o. MRN: [id number]  CC: No chief complaint on file.   HPI Thomas Ortiz is a 65 y.o. year old male with a history of hypertension, chronic knee and feet pain ever since an accident at work in 2011 where he fell off the roof.  He last had a visit with me for chronic disease management 10 months ago. Since then he has been seen by [facility] neurology where his Candiss Norse was increased to 10 mg twice daily and he was placed on Seroquel. He also had an ED visit 3 months ago for suicide attempt by self-inflicted laceration in his anterior neck which was sutured.  Interval History: He has had bilateral chest pain intermittently described as tightness and unrelated to activity.  I reviewed his notes which indicate he was referred to cardiology in 11/2020 for angina but he no showed.  He is unable to provide much information with regards to his chest pain.  Has also had rhinorrhea, throat pain, dyspnea and cough with whitish sputum production.  He has no wheezing. Also feel he is going blind 'as his eyes are going down'  BP is elevated and it appears he is not taking Amlodipine.  I have had his daughter come into the examination room and she verifies he does have all his medications but she goes to work and is unsure if he takes them.  He sometimes has another person with him at home when she is at work.  He has an upcoming appointment with Pysch. With regards to all the symptoms this patient has complained about the daughter denies that he has ever mentioned the symptoms at home.  Obtaining history from the patient has been extremely challenging as he is tangential in his speech and does not answer all the questions asked. Past Medical History:  Diagnosis Date   Hypertension 2007    Past Surgical History:  Procedure Laterality Date   bilateral foot sx  2011   CHOLECYSTECTOMY N/A 01/30/2015   Procedure: LAPAROSCOPIC  CHOLECYSTECTOMY WITH INTRAOPERATIVE CHOLANGIOGRAM;  Surgeon: Manus Rudd, MD;  Location: [facility] OR;  Service: General;  Laterality: N/A;   COLONOSCOPY N/A 08/17/2015   Procedure: COLONOSCOPY;  Surgeon: West Bali, MD;  Location: [facility]O SUITE;  Service: Endoscopy;  Laterality: N/A;  1:45 PM    History reviewed. No pertinent family history.  No Known Allergies  Outpatient Medications Prior to Visit  Medication Sig Dispense Refill   amLODipine (NORVASC) 5 MG tablet TAKE 1 TABLET (5 MG TOTAL) BY MOUTH DAILY. (Patient not taking: Reported on 03/14/2021) 90 tablet 1   meclizine (ANTIVERT) 25 MG tablet TAKE 1 TABLET (25 MG TOTAL) BY MOUTH 2 (TWO) TIMES DAILY AS NEEDED FOR DIZZINESS. (Patient not taking: Reported on 03/14/2021) 30 tablet 1   memantine (NAMENDA) 10 MG tablet Take 1 tablet (10 mg total) by mouth 2 (two) times daily. (Patient not taking: Reported on 03/14/2021) 30 tablet 11   metoprolol succinate (TOPROL-XL) 25 MG 24 hr tablet TAKE 1 TABLET (25 MG TOTAL) BY MOUTH DAILY. (Patient not taking: Reported on 03/14/2021) 90 tablet 1   OLANZapine (ZYPREXA) 10 MG tablet Take 1 tablet (10 mg total) by mouth at bedtime. 30 tablet 0   QUEtiapine (SEROQUEL) 25 MG tablet Take 1 tablet at night for agitation, paranoia (Patient not taking: Reported on 03/14/2021) 30 tablet 3   QUEtiapine (SEROQUEL) 25 MG tablet Take 1 tablet (25 mg total)  by mouth 2 (two) times daily. 60 tablet 0   rosuvastatin (CRESTOR) 40 MG tablet TAKE 1 TABLET (40 MG TOTAL) BY MOUTH DAILY. (Patient not taking: Reported on 03/14/2021) 90 tablet 1   sertraline (ZOLOFT) 50 MG tablet Take 1 tablet (50 mg total) by mouth daily. 30 tablet 3   No facility-administered medications prior to visit.     ROS Review of Systems  Constitutional:  Negative for activity change and appetite change.  HENT:  Positive for rhinorrhea and sore throat. Negative for sinus pressure.   Eyes:  Positive for visual disturbance.  Respiratory:  Positive for  chest tightness and shortness of breath. Negative for cough.   Cardiovascular:  Negative for chest pain and leg swelling.  Gastrointestinal:  Negative for abdominal distention, abdominal pain, constipation and diarrhea.  Endocrine: Negative.   Genitourinary:  Negative for dysuria.  Musculoskeletal:  Negative for joint swelling and myalgias.  Skin:  Negative for rash.  Allergic/Immunologic: Negative.   Neurological:  Negative for weakness, light-headedness and numbness.  Psychiatric/Behavioral:  Negative for dysphoric mood and suicidal ideas.    Objective:  BP (!) 151/91   Pulse 60   Ht 5\' 3"  (1.6 m)   Wt 135 lb 3.2 oz (61.3 kg)   SpO2 100%   BMI 23.95 kg/m   BP/Weight 05/22/2021 04/10/2021 03/25/2021  Systolic BP 151 123 132  Diastolic BP 91 79 74  Wt. (Lbs) 135.2 130.8 -  BMI 23.95 23.17 -  Some encounter information is confidential and restricted. Go to Review Flowsheets activity to see all data.      Physical Exam Constitutional:      Appearance: He is well-developed.  Cardiovascular:     Rate and Rhythm: Normal rate.     Heart sounds: Normal heart sounds. No murmur heard. Pulmonary:     Effort: Pulmonary effort is normal.     Breath sounds: Normal breath sounds. No wheezing or rales.  Chest:     Chest wall: No tenderness.  Abdominal:     General: Bowel sounds are normal. There is no distension.     Palpations: Abdomen is soft. There is no mass.     Tenderness: There is no abdominal tenderness.  Musculoskeletal:        General: Normal range of motion.     Right lower leg: No edema.     Left lower leg: No edema.  Neurological:     Mental Status: He is alert. Mental status is at baseline.     Comments: Not fully oriented  Psychiatric:        Mood and Affect: Mood normal.    CMP Latest Ref Rng & Units 04/10/2021 03/13/2021 11/26/2020  Glucose 70 - 99 mg/dL 92 99 11/28/2020)  BUN 8 - 27 mg/dL 11 10 15   Creatinine 0.76 - 1.27 mg/dL 993(Z 1.69)  Sodium 134 - 144  mmol/L 142 139 138  Potassium 3.5 - 5.2 mmol/L 4.3 3.3(L) 3.4(L)  Chloride 96 - 106 mmol/L 105 107 102  CO2 20 - 29 mmol/L 23 26 26   Calcium 8.6 - 10.2 mg/dL 9.5 9.2 9.5  Total Protein 6.0 - 8.5 g/dL 6.1 6.8 7.7  Total Bilirubin 0.0 - 1.2 mg/dL 0.3 0.9 0.8  Alkaline Phos 44 - 121 IU/L 74 73 91  AST 0 - 40 IU/L 13 19 14(L)  ALT 0 - 44 IU/L 9 10 9     Lipid Panel     Component Value Date/Time   CHOL 225 (H) 10/18/2019  1512   TRIG 154 (H) 10/18/2019 1512   HDL 62 10/18/2019 1512   CHOLHDL 3.6 10/18/2019 1512   LDLCALC 136 (H) 10/18/2019 1512    CBC    Component Value Date/Time   WBC 9.2 03/13/2021 2228   RBC 4.22 03/13/2021 2228   HGB 13.5 03/13/2021 2228   HGB 14.0 07/17/2020 1053   HCT 40.7 03/13/2021 2228   HCT 42.3 07/17/2020 1053   PLT 226 03/13/2021 2228   PLT 254 07/17/2020 1053   MCV 96.4 03/13/2021 2228   MCV 98 (H) 07/17/2020 1053   MCH 32.0 03/13/2021 2228   MCHC 33.2 03/13/2021 2228   RDW 13.7 03/13/2021 2228   RDW 13.0 07/17/2020 1053   LYMPHSABS 1.8 03/13/2021 2228   LYMPHSABS 1.5 07/17/2020 1053   MONOABS 0.6 03/13/2021 2228   EOSABS 0.1 03/13/2021 2228   EOSABS 0.1 07/17/2020 1053   BASOSABS 0.0 03/13/2021 2228   BASOSABS 0.1 07/17/2020 1053    Lab Results  Component Value Date   HGBA1C 5.6 07/17/2020    Vision Screening   Right eye Left eye Both eyes  Without correction   20/200  With correction   20/200    Assessment & Plan:  1. Atypical chest pain EKG is reassuring There could be a musculoskeletal component Advised to reschedule her appointment with cardiology  2. Other chronic sinusitis This could explain his nasal and throat symptoms No dyspnea noticed on my examination - fluticasone (FLONASE) 50 MCG/ACT nasal spray; Place 2 sprays into both nostrils daily.  Dispense: 16 g; Refill: 1 - cetirizine (ZYRTEC) 10 MG tablet; Take 1 tablet (10 mg total) by mouth daily.  Dispense: 30 tablet; Refill: 1  3. Dementia without behavioral  disturbance (HCC) This does seem to be worsening He has not been compliant with his medications Had a lengthy discussion with his daughter about the need to have someone with the patient at all times.  He will need assistance with regards to compliance with medications and the option of using a pillbox has been shared with her to assist him in compliance  4. Essential hypertension Uncontrolled due to noncompliance with medications Discussed techniques to aid medication adherence which will involve family members Counseled on blood pressure goal of less than 130/80, low-sodium, DASH diet, medication compliance, 150 minutes of moderate intensity exercise per week. Discussed medication compliance, adverse effects.  5. Vision abnormalities Severely abnormal vision bilaterally of 20/200 I have placed an urgent ophthalmology referral He has no medical coverage and unfortunately will need to pay $100 to be seen at [facility] eye care per referral coordinator.  We will communicate this to his daughter.  6. Need for immunization against influenza - Flu Vaccine QUAD 84mo+IM (Fluarix, Fluzone & Alfiuria Quad PF)    Meds ordered this encounter  Medications   fluticasone (FLONASE) 50 MCG/ACT nasal spray    Sig: Place 2 sprays into both nostrils daily.    Dispense:  16 g    Refill:  1   cetirizine (ZYRTEC) 10 MG tablet    Sig: Take 1 tablet (10 mg total) by mouth daily.    Dispense:  30 tablet    Refill:  1    Follow-up: Return in about 3 months (around 08/20/2021) for Chronic medical conditions.       Hoy Register, MD, FAAFP. [facility] [place], [place] [phone]   05/22/2021, 11:22 AM

## 2021-05-22 NOTE — Telephone Encounter (Signed)
Please inform the patient and his daughter I have referred him to [facility] eye care due to severe visual loss and I would recommend that we will going to [facility] due to the extent of his symptoms.  Please inform her of the financial implication.  Thank you

## 2021-05-22 NOTE — Progress Notes (Signed)
States that he is having SOB. Pain in throat and runny nose. Has chest pain.

## 2021-05-22 NOTE — Telephone Encounter (Signed)
Pt has the option to go to [facility] and pay 100.00 or he can go to the eye center in [facility] and pay 75.

## 2021-05-22 NOTE — Telephone Encounter (Signed)
Call was placed to patient's daughter and she was informed of referral being placed and possible cost of visit. She was also informed of the [facility] as well.

## 2021-05-29 ENCOUNTER — Other Ambulatory Visit: Payer: Self-pay

## 2021-06-03 ENCOUNTER — Institutional Professional Consult (permissible substitution): Payer: Self-pay | Admitting: Clinical

## 2021-07-10 ENCOUNTER — Inpatient Hospital Stay (HOSPITAL_COMMUNITY)
Admission: EM | Admit: 2021-07-10 | Discharge: 2021-07-14 | DRG: 603 | Disposition: A | Discharge status: Home / Self Care | Payer: Self-pay | Attending: Internal Medicine | Admitting: Internal Medicine

## 2021-07-10 ENCOUNTER — Other Ambulatory Visit: Payer: Self-pay

## 2021-07-10 ENCOUNTER — Encounter (HOSPITAL_COMMUNITY): Payer: Self-pay | Admitting: Pediatrics

## 2021-07-10 DIAGNOSIS — Z20822 Contact with and (suspected) exposure to covid-19: Secondary | ICD-10-CM | POA: Diagnosis present

## 2021-07-10 DIAGNOSIS — L03115 Cellulitis of right lower limb: Principal | ICD-10-CM | POA: Diagnosis present

## 2021-07-10 DIAGNOSIS — Z79899 Other long term (current) drug therapy: Secondary | ICD-10-CM

## 2021-07-10 DIAGNOSIS — E785 Hyperlipidemia, unspecified: Secondary | ICD-10-CM | POA: Diagnosis present

## 2021-07-10 DIAGNOSIS — I10 Essential (primary) hypertension: Secondary | ICD-10-CM | POA: Diagnosis present

## 2021-07-10 DIAGNOSIS — L0390 Cellulitis, unspecified: Secondary | ICD-10-CM

## 2021-07-10 DIAGNOSIS — M79604 Pain in right leg: Secondary | ICD-10-CM

## 2021-07-10 DIAGNOSIS — F0390 Unspecified dementia without behavioral disturbance: Secondary | ICD-10-CM | POA: Diagnosis present

## 2021-07-10 DIAGNOSIS — F05 Delirium due to known physiological condition: Secondary | ICD-10-CM | POA: Diagnosis present

## 2021-07-10 LAB — CBC WITH DIFFERENTIAL/PLATELET
Abs Immature Granulocytes: 0.05 10*3/uL (ref 0.00–0.07)
Basophils Absolute: 0 10*3/uL (ref 0.0–0.1)
Basophils Relative: 0 %
Eosinophils Absolute: 0 10*3/uL (ref 0.0–0.5)
Eosinophils Relative: 0 %
HCT: 42.4 % (ref 39.0–52.0)
Hemoglobin: 14.2 g/dL (ref 13.0–17.0)
Immature Granulocytes: 1 %
Lymphocytes Relative: 8 %
Lymphs Abs: 0.8 10*3/uL (ref 0.7–4.0)
MCH: 32 pg (ref 26.0–34.0)
MCHC: 33.5 g/dL (ref 30.0–36.0)
MCV: 95.5 fL (ref 80.0–100.0)
Monocytes Absolute: 1 10*3/uL (ref 0.1–1.0)
Monocytes Relative: 9 %
Neutro Abs: 9.1 10*3/uL — ABNORMAL HIGH (ref 1.7–7.7)
Neutrophils Relative %: 82 %
Platelets: 211 10*3/uL (ref 150–400)
RBC: 4.44 MIL/uL (ref 4.22–5.81)
RDW: 13.4 % (ref 11.5–15.5)
WBC: 11 10*3/uL — ABNORMAL HIGH (ref 4.0–10.5)
nRBC: 0 % (ref 0.0–0.2)

## 2021-07-10 LAB — BASIC METABOLIC PANEL
Anion gap: 15 (ref 5–15)
BUN: 16 mg/dL (ref 8–23)
CO2: 21 mmol/L — ABNORMAL LOW (ref 22–32)
Calcium: 9.1 mg/dL (ref 8.9–10.3)
Chloride: 99 mmol/L (ref 98–111)
Creatinine, Ser: 1.36 mg/dL — ABNORMAL HIGH (ref 0.61–1.24)
GFR, Estimated: 57 mL/min — ABNORMAL LOW (ref >60)
Glucose, Bld: 102 mg/dL — ABNORMAL HIGH (ref 70–99)
Potassium: 3.5 mmol/L (ref 3.5–5.1)
Sodium: 135 mmol/L (ref 135–145)

## 2021-07-10 NOTE — ED Triage Notes (Signed)
Arrived via POV; c/o redness on right lower leg along with burning sensation and swelling. Stated started yesterday. Son reported history of rods on RLE due to fractures 5 years ago. Denies any recent fall / trauma.

## 2021-07-10 NOTE — ED Provider Triage Note (Signed)
Emergency Medicine Provider Triage Evaluation Note  Thomas Ortiz , a 66 y.o. male  was evaluated in triage.  Pt complains of right lower extremity pain.  Patient states that he noticed this pain yesterday.    Review of Systems  Positive: Right lower extremity pain, fevers Negative: Nausea, vomiting, diarrhea, abdominal pain  Physical Exam  BP (!) 160/104    Pulse (!) 107    Temp (!) 101.9 F (38.8 C) (Oral)    Resp 18    Ht 5\' 6"  (1.676 m)    Wt 59 kg    SpO2 100%    BMI 20.98 kg/m  Gen:   Awake, no distress   Resp:  Normal effort  MSK:   Moves extremities without difficulty  Other:  Right lower extremity skin is markedly erythematous  Medical Decision Making  Medically screening exam initiated at 7:00 PM.  Appropriate orders placed.  Thomas Ortiz was informed that the remainder of the evaluation will be completed by another provider, this initial triage assessment does not replace that evaluation, and the importance of remaining in the ED until their evaluation is complete.     Thomas Quale, PA-C 07/10/21 1902

## 2021-07-11 ENCOUNTER — Emergency Department (HOSPITAL_BASED_OUTPATIENT_CLINIC_OR_DEPARTMENT_OTHER): Payer: Self-pay

## 2021-07-11 ENCOUNTER — Emergency Department (HOSPITAL_COMMUNITY): Payer: Self-pay

## 2021-07-11 ENCOUNTER — Encounter (HOSPITAL_COMMUNITY): Payer: Self-pay | Admitting: Internal Medicine

## 2021-07-11 DIAGNOSIS — M7989 Other specified soft tissue disorders: Secondary | ICD-10-CM

## 2021-07-11 DIAGNOSIS — L0390 Cellulitis, unspecified: Secondary | ICD-10-CM

## 2021-07-11 DIAGNOSIS — L03115 Cellulitis of right lower limb: Principal | ICD-10-CM

## 2021-07-11 DIAGNOSIS — R609 Edema, unspecified: Secondary | ICD-10-CM

## 2021-07-11 LAB — RESP PANEL BY RT-PCR (FLU A&B, COVID) ARPGX2
Influenza A by PCR: NEGATIVE
Influenza B by PCR: NEGATIVE
SARS Coronavirus 2 by RT PCR: NEGATIVE

## 2021-07-11 LAB — LACTIC ACID, PLASMA
Lactic Acid, Venous: 1.3 mmol/L (ref 0.5–1.9)
Lactic Acid, Venous: 2.1 mmol/L (ref 0.5–1.9)

## 2021-07-11 MED ORDER — HYDROCODONE-ACETAMINOPHEN 5-325 MG PO TABS
1.0000 | ORAL_TABLET | ORAL | Status: DC | PRN
  Administered 2021-07-11 – 2021-07-12 (×3): 2 via ORAL
  Administered 2021-07-12 – 2021-07-13 (×2): 1 via ORAL
  Administered 2021-07-13: 2 via ORAL
  Administered 2021-07-13: 1 via ORAL
  Filled 2021-07-11: qty 1
  Filled 2021-07-11 (×2): qty 2
  Filled 2021-07-11: qty 1
  Filled 2021-07-11: qty 2
  Filled 2021-07-11: qty 1
  Filled 2021-07-11: qty 2
  Filled 2021-07-11: qty 1

## 2021-07-11 MED ORDER — QUETIAPINE FUMARATE 25 MG PO TABS: 25.0000 mg | ORAL_TABLET | Freq: Two times a day (BID) | ORAL | Status: DC

## 2021-07-11 MED ORDER — ENOXAPARIN SODIUM 40 MG/0.4ML IJ SOSY
40.0000 mg | PREFILLED_SYRINGE | INTRAMUSCULAR | Status: DC
  Administered 2021-07-11 – 2021-07-13 (×3): 40 mg via SUBCUTANEOUS
  Filled 2021-07-11 (×3): qty 0.4

## 2021-07-11 MED ORDER — MEMANTINE HCL 10 MG PO TABS
10.0000 mg | ORAL_TABLET | Freq: Two times a day (BID) | ORAL | Status: DC
  Administered 2021-07-11 – 2021-07-14 (×6): 10 mg via ORAL
  Filled 2021-07-11 (×7): qty 1

## 2021-07-11 MED ORDER — ACETAMINOPHEN 325 MG PO TABS: 650.0000 mg | ORAL_TABLET | Freq: Four times a day (QID) | ORAL | Status: DC | PRN

## 2021-07-11 MED ORDER — SERTRALINE HCL 50 MG PO TABS
50.0000 mg | ORAL_TABLET | Freq: Every day | ORAL | Status: DC
  Administered 2021-07-11 – 2021-07-14 (×4): 50 mg via ORAL
  Filled 2021-07-11 (×4): qty 1

## 2021-07-11 MED ORDER — OLANZAPINE 10 MG PO TABS: 10.0000 mg | ORAL_TABLET | Freq: Every day | ORAL | Status: DC

## 2021-07-11 MED ORDER — BISACODYL 5 MG PO TBEC: 5.0000 mg | DELAYED_RELEASE_TABLET | Freq: Every day | ORAL | Status: DC | PRN

## 2021-07-11 MED ORDER — FLUTICASONE PROPIONATE 50 MCG/ACT NA SUSP
2.0000 | Freq: Every day | NASAL | Status: DC
  Administered 2021-07-11 – 2021-07-14 (×4): 2 via NASAL
  Filled 2021-07-11: qty 16

## 2021-07-11 MED ORDER — LORATADINE 10 MG PO TABS
10.0000 mg | ORAL_TABLET | Freq: Every day | ORAL | Status: DC
  Administered 2021-07-11 – 2021-07-14 (×4): 10 mg via ORAL
  Filled 2021-07-11 (×4): qty 1

## 2021-07-11 MED ORDER — ACETAMINOPHEN 650 MG RE SUPP: 650.0000 mg | Freq: Four times a day (QID) | RECTAL | Status: DC | PRN

## 2021-07-11 MED ORDER — METOPROLOL SUCCINATE ER 25 MG PO TB24
25.0000 mg | ORAL_TABLET | Freq: Every day | ORAL | Status: DC
  Administered 2021-07-11 – 2021-07-12 (×2): 25 mg via ORAL
  Filled 2021-07-11 (×2): qty 1

## 2021-07-11 MED ORDER — CEFAZOLIN SODIUM-DEXTROSE 1-4 GM/50ML-% IV SOLN
1.0000 g | Freq: Three times a day (TID) | INTRAVENOUS | Status: DC
  Administered 2021-07-11 – 2021-07-14 (×10): 1 g via INTRAVENOUS
  Filled 2021-07-11 (×11): qty 50

## 2021-07-11 MED ORDER — OXYCODONE-ACETAMINOPHEN 5-325 MG PO TABS
1.0000 | ORAL_TABLET | Freq: Once | ORAL | Status: AC
Start: 2021-07-11 — End: 2021-07-11
  Administered 2021-07-11: 1 via ORAL
  Filled 2021-07-11: qty 1

## 2021-07-11 MED ORDER — CEFAZOLIN SODIUM-DEXTROSE 1-4 GM/50ML-% IV SOLN
1.0000 g | Freq: Once | INTRAVENOUS | Status: AC
  Administered 2021-07-11: 1 g via INTRAVENOUS
  Filled 2021-07-11: qty 50

## 2021-07-11 MED ORDER — ACETAMINOPHEN 500 MG PO TABS
500.0000 mg | ORAL_TABLET | Freq: Once | ORAL | Status: AC
  Administered 2021-07-11: 500 mg via ORAL
  Filled 2021-07-11: qty 1

## 2021-07-11 MED ORDER — HYDRALAZINE HCL 25 MG PO TABS: 25.0000 mg | ORAL_TABLET | Freq: Four times a day (QID) | ORAL | Status: DC | PRN

## 2021-07-11 MED ORDER — FENTANYL CITRATE PF 50 MCG/ML IJ SOSY
50.0000 ug | PREFILLED_SYRINGE | Freq: Once | INTRAMUSCULAR | Status: AC
  Administered 2021-07-11: 50 ug via INTRAVENOUS
  Filled 2021-07-11: qty 1

## 2021-07-11 MED ORDER — ROSUVASTATIN CALCIUM 20 MG PO TABS
40.0000 mg | ORAL_TABLET | Freq: Every day | ORAL | Status: DC
  Administered 2021-07-11 – 2021-07-14 (×4): 40 mg via ORAL
  Filled 2021-07-11 (×4): qty 2

## 2021-07-11 NOTE — H&P (Signed)
History and Physical    Thomas Ortiz ZOX:[id number] DOB: 09-05-1955 DOA: 07/10/2021  PCP: Hoy Register, MD (Confirm with patient/family/NH records and if not entered, this has to be entered at [facility] point of entry) Patient coming from: Home  I have personally briefly reviewed patient's old medical records in [facility]  Chief Complaint: Right leg rash and pain  HPI: Thomas Ortiz is a 66 y.o. male Spanish-speaking, with medical history significant of dementia, HTN, HLD, presented with 3 days of right leg rash pain and fever.  Family reported that the patient has some itchiness of the right leg and started to scratch at night and may cause some superficial wound last week.  3 days ago family first noticed rash of the right knee and patient started to feel pain and hotness of the local area for two days.  He also complained about subjective fever and chills yesterday.  Baseline dementia, symptoms mild, with occasional sundowning but not recently as per patient family.  ED Course: Spiked fever 101.7 yesterday evening, blood pressure stable.  Blood work complaints WBC 11.2, lactic acid 2.1> 1.3.  Cefazolin started in the ED.  DVT study negative.  Review of Systems: As per HPI otherwise 14 point review of systems negative.    Past Medical History:  Diagnosis Date   Hypertension 2007    Past Surgical History:  Procedure Laterality Date   bilateral foot sx  2011   CHOLECYSTECTOMY N/A 01/30/2015   Procedure: LAPAROSCOPIC CHOLECYSTECTOMY WITH INTRAOPERATIVE CHOLANGIOGRAM;  Surgeon: Manus Rudd, MD;  Location: [facility] OR;  Service: General;  Laterality: N/A;   COLONOSCOPY N/A 08/17/2015   Procedure: COLONOSCOPY;  Surgeon: West Bali, MD;  Location: [facility] ENDO SUITE;  Service: Endoscopy;  Laterality: N/A;  1:45 PM     reports that he has quit smoking. He has never used smokeless tobacco. He reports that he does not drink alcohol and does not use drugs.  No Known Allergies  No family  history on file.   Prior to Admission medications   Medication Sig Start Date End Date Taking? Authorizing Provider  amLODipine (NORVASC) 5 MG tablet TAKE 1 TABLET (5 MG TOTAL) BY MOUTH DAILY. 07/17/20 07/17/21  Hoy Register, MD  cetirizine (ZYRTEC) 10 MG tablet Take 1 tablet (10 mg total) by mouth daily. 05/22/21   Hoy Register, MD  fluticasone (FLONASE) 50 MCG/ACT nasal spray Place 2 sprays into both nostrils daily. 05/22/21   Hoy Register, MD  meclizine (ANTIVERT) 25 MG tablet TAKE 1 TABLET (25 MG TOTAL) BY MOUTH 2 (TWO) TIMES DAILY AS NEEDED FOR DIZZINESS. 08/30/20 08/30/21  Hoy Register, MD  memantine (NAMENDA) 10 MG tablet Take 1 tablet (10 mg total) by mouth 2 (two) times daily. 11/08/20   Marcos Eke, PA-C  metoprolol succinate (TOPROL-XL) 25 MG 24 hr tablet TAKE 1 TABLET (25 MG TOTAL) BY MOUTH DAILY. 07/17/20 07/17/21  Hoy Register, MD  OLANZapine (ZYPREXA) 10 MG tablet Take 1 tablet (10 mg total) by mouth at bedtime. 03/24/21   Mancel Bale, MD  QUEtiapine (SEROQUEL) 25 MG tablet Take 1 tablet at night for agitation, paranoia 11/08/20   Marcos Eke, PA-C  QUEtiapine (SEROQUEL) 25 MG tablet Take 1 tablet (25 mg total) by mouth 2 (two) times daily. 03/24/21   Mancel Bale, MD  rosuvastatin (CRESTOR) 40 MG tablet TAKE 1 TABLET (40 MG TOTAL) BY MOUTH DAILY. 07/17/20 07/17/21  Hoy Register, MD  sertraline (ZOLOFT) 50 MG tablet Take 1 tablet (50 mg total) by mouth  daily. 04/10/21   Argentina Donovan, PA-C    Physical Exam: Vitals:   07/11/21 1145 07/11/21 1245 07/11/21 1330 07/11/21 1345  BP: 102/74 114/68 121/81 108/75  Pulse: 67 70 68 65  Resp: 16 16 16 18   Temp:      TempSrc:      SpO2: 99% 98% 100% 99%  Weight:      Height:        Constitutional: NAD, calm, comfortable Vitals:   07/11/21 1145 07/11/21 1245 07/11/21 1330 07/11/21 1345  BP: 102/74 114/68 121/81 108/75  Pulse: 67 70 68 65  Resp: 16 16 16 18   Temp:      TempSrc:      SpO2: 99% 98% 100% 99%   Weight:      Height:       Eyes: PERRL, lids and conjunctivae normal ENMT: Mucous membranes are moist. Posterior pharynx clear of any exudate or lesions.Normal dentition.  Neck: normal, supple, no masses, no thyromegaly Respiratory: clear to auscultation bilaterally, no wheezing, no crackles. Normal respiratory effort. No accessory muscle use.  Cardiovascular: Regular rate and rhythm, no murmurs / rubs / gallops. No extremity edema. 2+ pedal pulses. No carotid bruits.  Abdomen: no tenderness, no masses palpated. No hepatosplenomegaly. Bowel sounds positive.  Musculoskeletal: no clubbing / cyanosis. No joint deformity upper and lower extremities. Good ROM, no contractures. Normal muscle tone.  Skin: Rash and warm to touch right shin area, tenderness. Neurologic: CN 2-12 grossly intact. Sensation intact, DTR normal. Strength 5/5 in all 4.  Psychiatric: Normal judgment and insight. Alert and oriented x 3. Normal mood.       Labs on Admission: I have personally reviewed following labs and imaging studies  CBC: Recent Labs  Lab 07/10/21 1921  WBC 11.0*  NEUTROABS 9.1*  HGB 14.2  HCT 42.4  MCV 95.5  PLT 123456   Basic Metabolic Panel: Recent Labs  Lab 07/10/21 1921  NA 135  K 3.5  CL 99  CO2 21*  GLUCOSE 102*  BUN 16  CREATININE 1.36*  CALCIUM 9.1   GFR: Estimated Creatinine Clearance: 44 mL/min (A) (by C-G formula based on SCr of 1.36 mg/dL (H)). Liver Function Tests: No results for input(s): AST, ALT, ALKPHOS, BILITOT, PROT, ALBUMIN in the last 168 hours. No results for input(s): LIPASE, AMYLASE in the last 168 hours. No results for input(s): AMMONIA in the last 168 hours. Coagulation Profile: No results for input(s): INR, PROTIME in the last 168 hours. Cardiac Enzymes: No results for input(s): CKTOTAL, CKMB, CKMBINDEX, TROPONINI in the last 168 hours. BNP (last 3 results) No results for input(s): PROBNP in the last 8760 hours. HbA1C: No results for input(s):  HGBA1C in the last 72 hours. CBG: No results for input(s): GLUCAP in the last 168 hours. Lipid Profile: No results for input(s): CHOL, HDL, LDLCALC, TRIG, CHOLHDL, LDLDIRECT in the last 72 hours. Thyroid Function Tests: No results for input(s): TSH, T4TOTAL, FREET4, T3FREE, THYROIDAB in the last 72 hours. Anemia Panel: No results for input(s): VITAMINB12, FOLATE, FERRITIN, TIBC, IRON, RETICCTPCT in the last 72 hours. Urine analysis:    Component Value Date/Time   COLORURINE YELLOW 03/16/2021 1435   APPEARANCEUR CLEAR 03/16/2021 1435   LABSPEC 1.016 03/16/2021 1435   PHURINE 5.0 03/16/2021 1435   GLUCOSEU NEGATIVE 03/16/2021 1435   HGBUR NEGATIVE 03/16/2021 1435   BILIRUBINUR NEGATIVE 03/16/2021 1435   KETONESUR NEGATIVE 03/16/2021 1435   PROTEINUR NEGATIVE 03/16/2021 1435   UROBILINOGEN 0.2 07/20/2020 1248   NITRITE  NEGATIVE 03/16/2021 1435   LEUKOCYTESUR NEGATIVE 03/16/2021 1435    Radiological Exams on Admission: DG Tibia/Fibula Right  Result Date: 07/11/2021 CLINICAL DATA:  Leg infection. EXAM: RIGHT TIBIA AND FIBULA - 2 VIEW COMPARISON:  None. FINDINGS: There is no evidence of acute fracture or other focal bone lesions. Status post surgical internal fixation of old distal right fibular fracture. Soft tissues are unremarkable. IMPRESSION: No acute abnormality seen. Electronically Signed   By: Marijo Conception M.D.   On: 07/11/2021 09:01   VAS [place] LOWER EXTREMITY VENOUS (DVT)  Result Date: 07/11/2021  Lower Venous DVT Study Patient Name:  Thomas Ortiz  Date of Exam:   07/11/2021 Medical Rec #: [id number]    Accession #:    [id number] Date of Birth: Feb 14, 1956   Patient Gender: M Patient Age:   48 years Exam Location:  [facility] Procedure:      VAS [place] LOWER EXTREMITY VENOUS (DVT) Referring Phys: Genevive Bi --------------------------------------------------------------------------------  Indications: Swelling, and Edema.  Comparison Study: 03/15/19 prior Performing  Technologist: Archie Patten RVS  Examination Guidelines: A complete evaluation includes B-mode imaging, spectral Doppler, color Doppler, and power Doppler as needed of all accessible portions of each vessel. Bilateral testing is considered an integral part of a complete examination. Limited examinations for reoccurring indications may be performed as noted. The reflux portion of the exam is performed with the patient in reverse Trendelenburg.  +---------+---------------+---------+-----------+----------+-------------------+  RIGHT     Compressibility Phasicity Spontaneity Properties Thrombus Aging       +---------+---------------+---------+-----------+----------+-------------------+  CFV       Full            Yes       Yes                                         +---------+---------------+---------+-----------+----------+-------------------+  SFJ       Full                                                                  +---------+---------------+---------+-----------+----------+-------------------+  FV Prox   Full                                                                  +---------+---------------+---------+-----------+----------+-------------------+  FV Mid    Full                                                                  +---------+---------------+---------+-----------+----------+-------------------+  FV Distal Full                                                                  +---------+---------------+---------+-----------+----------+-------------------+  PFV       Full                                                                  +---------+---------------+---------+-----------+----------+-------------------+  POP       Full            Yes       Yes                                         +---------+---------------+---------+-----------+----------+-------------------+  PTV       Full                                                                   +---------+---------------+---------+-----------+----------+-------------------+  PERO                                                       Not well visualized  +---------+---------------+---------+-----------+----------+-------------------+   +----+---------------+---------+-----------+----------+--------------+  LEFT Compressibility Phasicity Spontaneity Properties Thrombus Aging  +----+---------------+---------+-----------+----------+--------------+  CFV  Full            Yes       Yes                                    +----+---------------+---------+-----------+----------+--------------+     Summary: RIGHT: - There is no evidence of deep vein thrombosis in the lower extremity.  - No cystic structure found in the popliteal fossa.  LEFT: - No evidence of common femoral vein obstruction.  *See table(s) above for measurements and observations.    Preliminary     EKG: None  Assessment/Plan Active Problems:   * No active hospital problems. *  (please populate well all problems here in Problem List. (For example, if patient is on BP meds at home and you resume or decide to hold them, it is a problem that needs to be her. Same for CAD, COPD, HLD and so on)  Right leg cellulitis -Involving more than 1/3 of surface area of the right leg, I did not see any purulent discharge, will continue cefazolin.  Likely can switch to p.o. antibiotics and discharge home tomorrow. Send MRSA antigen screening but will hold off escalation of ABX for now. -Send Strep A PCR. -DVT ruled out. -Pain control  SIRS -Secondary to right leg cellulitis, resolved.  Antibiotics as above.  Dementia with occasional sundowning -Mentation at baseline, continue Seroquel, check EKG. -Continue memantine  HTN -Continue metoprolol, hold amlodipine, add as needed hydralazine.  HLD -Continue statin.  DVT prophylaxis: Lovenox Code Status: Full code Family Communication: Daughter over phone Disposition Plan: Expect less than 2  midnight hospital stay Consults called: NOne Admission status: Medsurg obs   Lequita Halt MD  [facility] Pager [phone]  07/11/2021, 2:30 PM

## 2021-07-11 NOTE — ED Notes (Signed)
[name] daughter [phone] requesting an update on the patient

## 2021-07-11 NOTE — Progress Notes (Signed)
Lower extremity venous has been completed.   Preliminary results in CV Proc.   Thomas Ortiz 07/11/2021 11:09 AM

## 2021-07-11 NOTE — ED Notes (Signed)
Pt incontinent of urine. RN changed sheets, gown and provided peri care. Pt also voided in urinal. Pt given snack and drink. Pt c/o pain, RN asked Dr Rush Landmark for pain med order. Pt resting in bed with call bell.

## 2021-07-11 NOTE — ED Provider Notes (Signed)
[facility] EMER[facility]ENC[facility] DEPARTMENT Provider Note   CSN: [id number] Arrival date & time: 07/10/21  1746     History  Chief Complaint  Patient presents with   Leg Pain    Thomas Ortiz is a 66 y.o. male.  The history is provided by the patient and medical records. The history is limited by a language barrier. Language interpreter used: family wanted to interpret.  Leg Pain Location:  Leg Time since incident:  3 days Injury: no   Leg location:  R lower leg Pain details:    Quality:  Aching   Radiates to:  R leg   Severity:  Severe   Onset quality:  Gradual   Duration:  3 days   Timing:  Constant   Progression:  Worsening Chronicity:  New Tetanus status:  Unknown Prior injury to area:  Yes (with hardware years ago) Relieved by:  Nothing Worsened by:  Bearing weight Ineffective treatments:  None tried Associated symptoms: fever and swelling   Associated symptoms: no back pain, no fatigue, no neck pain, no numbness, no stiffness and no tingling       Home Medications Prior to Admission medications   Medication Sig Start Date End Date Taking? Authorizing Provider  amLODipine (NORVASC) 5 MG tablet TAKE 1 TABLET (5 MG TOTAL) BY MOUTH DAILY. Patient not taking: Reported on 03/14/2021 07/17/20 07/17/21  Charlott Rakes, MD  cetirizine (ZYRTEC) 10 MG tablet Take 1 tablet (10 mg total) by mouth daily. 05/22/21   Charlott Rakes, MD  fluticasone (FLONASE) 50 MCG/ACT nasal spray Place 2 sprays into both nostrils daily. 05/22/21   Charlott Rakes, MD  meclizine (ANTIVERT) 25 MG tablet TAKE 1 TABLET (25 MG TOTAL) BY MOUTH 2 (TWO) TIMES DAILY AS NEEDED FOR DIZZINESS. Patient not taking: Reported on 03/14/2021 08/30/20 08/30/21  Charlott Rakes, MD  memantine (NAMENDA) 10 MG tablet Take 1 tablet (10 mg total) by mouth 2 (two) times daily. Patient not taking: Reported on 03/14/2021 11/08/20   Rondel Jumbo, PA-C  metoprolol succinate (TOPROL-XL) 25 MG 24 hr tablet TAKE 1 TABLET  (25 MG TOTAL) BY MOUTH DAILY. Patient not taking: Reported on 03/14/2021 07/17/20 07/17/21  Charlott Rakes, MD  OLANZapine (ZYPREXA) 10 MG tablet Take 1 tablet (10 mg total) by mouth at bedtime. 03/24/21   Daleen Bo, MD  QUEtiapine (SEROQUEL) 25 MG tablet Take 1 tablet at night for agitation, paranoia Patient not taking: Reported on 03/14/2021 11/08/20   Rondel Jumbo, PA-C  QUEtiapine (SEROQUEL) 25 MG tablet Take 1 tablet (25 mg total) by mouth 2 (two) times daily. 03/24/21   Daleen Bo, MD  rosuvastatin (CRESTOR) 40 MG tablet TAKE 1 TABLET (40 MG TOTAL) BY MOUTH DAILY. Patient not taking: Reported on 03/14/2021 07/17/20 07/17/21  Charlott Rakes, MD  sertraline (ZOLOFT) 50 MG tablet Take 1 tablet (50 mg total) by mouth daily. 04/10/21   Argentina Donovan, PA-C      Allergies    Patient has no known allergies.    Review of Systems   Review of Systems  Constitutional:  Positive for chills and fever. Negative for fatigue.  HENT:  Negative for congestion.   Eyes:  Negative for visual disturbance.  Respiratory:  Negative for cough, chest tightness, shortness of breath and wheezing.   Cardiovascular:  Positive for leg swelling. Negative for chest pain and palpitations.  Gastrointestinal:  Negative for constipation, diarrhea, nausea and vomiting.  Genitourinary:  Negative for dysuria.  Musculoskeletal:  Negative for back pain, neck  pain, neck stiffness and stiffness.  Skin:  Positive for color change and rash.  Neurological:  Negative for headaches.  Psychiatric/Behavioral:  Negative for agitation.   All other systems reviewed and are negative.  Physical Exam Updated Vital Signs BP (!) 151/72 (BP Location: Right Arm)    Pulse 87    Temp 99.9 F (37.7 C) (Oral)    Resp 16    Ht 5\' 6"  (1.676 m)    Wt 59 kg    SpO2 98%    BMI 20.98 kg/m  Physical Exam Vitals and nursing note reviewed.  Constitutional:      General: He is not in acute distress.    Appearance: He is well-developed. He is  not ill-appearing, toxic-appearing or diaphoretic.  HENT:     Head: Normocephalic and atraumatic.     Nose: No congestion.     Mouth/Throat:     Mouth: Mucous membranes are moist.  Eyes:     Extraocular Movements: Extraocular movements intact.     Conjunctiva/sclera: Conjunctivae normal.     Pupils: Pupils are equal, round, and reactive to light.  Cardiovascular:     Rate and Rhythm: Regular rhythm. Tachycardia present.     Heart sounds: No murmur heard. Pulmonary:     Effort: Pulmonary effort is normal. No respiratory distress.     Breath sounds: Normal breath sounds. No wheezing, rhonchi or rales.  Chest:     Chest wall: No tenderness.  Abdominal:     General: Abdomen is flat.     Palpations: Abdomen is soft.     Tenderness: There is no abdominal tenderness. There is no guarding or rebound.  Musculoskeletal:        General: Swelling and tenderness present.     Cervical back: Neck supple. No tenderness.  Skin:    General: Skin is warm and dry.     Capillary Refill: Capillary refill takes less than 2 seconds.     Findings: Erythema and rash present.  Neurological:     Mental Status: He is alert.  Psychiatric:        Mood and Affect: Mood normal.     Upon arrival to ED bed after waiting in the waiting room   On arrival     ED Results / Procedures / Treatments   Labs (all labs ordered are listed, but only abnormal results are displayed) Labs Reviewed  CBC WITH DIFFERENTIAL/PLATELET - Abnormal; Notable for the following components:      Result Value   WBC 11.0 (*)    Neutro Abs 9.1 (*)    All other components within normal limits  BASIC METABOLIC PANEL - Abnormal; Notable for the following components:   CO2 21 (*)    Glucose, Bld 102 (*)    Creatinine, Ser 1.36 (*)    GFR, Estimated 57 (*)    All other components within normal limits  LACTIC ACID, PLASMA - Abnormal; Notable for the following components:   Lactic Acid, Venous 2.1 (*)    All other components  within normal limits  RESP PANEL BY RT-PCR (FLU A&B, COVID) ARPGX2  CULTURE, BLOOD (ROUTINE X 2)  CULTURE, BLOOD (ROUTINE X 2)  LACTIC ACID, PLASMA    EKG None  Radiology DG Tibia/Fibula Right  Result Date: 07/11/2021 CLINICAL DATA:  Leg infection. EXAM: RIGHT TIBIA AND FIBULA - 2 VIEW COMPARISON:  None. FINDINGS: There is no evidence of acute fracture or other focal bone lesions. Status post surgical internal fixation of old distal  right fibular fracture. Soft tissues are unremarkable. IMPRESSION: No acute abnormality seen. Electronically Signed   By: Marijo Conception M.D.   On: 07/11/2021 09:01   VAS [place] LOWER EXTREMITY VENOUS (DVT)  Result Date: 07/11/2021  Lower Venous DVT Study Patient Name:  Thomas Ortiz  Date of Exam:   07/11/2021 Medical Rec #: [id number]    Accession #:    [id number] Date of Birth: [birth year]   Patient Gender: M Patient Age:   7 years Exam Location:  [facility] Procedure:      VAS [place] LOWER EXTREMITY VENOUS (DVT) Referring Phys: Genevive Bi --------------------------------------------------------------------------------  Indications: Swelling, and Edema.  Comparison Study: 03/15/19 prior Performing Technologist: Archie Patten RVS  Examination Guidelines: A complete evaluation includes B-mode imaging, spectral Doppler, color Doppler, and power Doppler as needed of all accessible portions of each vessel. Bilateral testing is considered an integral part of a complete examination. Limited examinations for reoccurring indications may be performed as noted. The reflux portion of the exam is performed with the patient in reverse Trendelenburg.  +---------+---------------+---------+-----------+----------+-------------------+  RIGHT     Compressibility Phasicity Spontaneity Properties Thrombus Aging       +---------+---------------+---------+-----------+----------+-------------------+  CFV       Full            Yes       Yes                                          +---------+---------------+---------+-----------+----------+-------------------+  SFJ       Full                                                                  +---------+---------------+---------+-----------+----------+-------------------+  FV Prox   Full                                                                  +---------+---------------+---------+-----------+----------+-------------------+  FV Mid    Full                                                                  +---------+---------------+---------+-----------+----------+-------------------+  FV Distal Full                                                                  +---------+---------------+---------+-----------+----------+-------------------+  PFV       Full                                                                  +---------+---------------+---------+-----------+----------+-------------------+  POP       Full            Yes       Yes                                         +---------+---------------+---------+-----------+----------+-------------------+  PTV       Full                                                                  +---------+---------------+---------+-----------+----------+-------------------+  PERO                                                       Not well visualized  +---------+---------------+---------+-----------+----------+-------------------+   +----+---------------+---------+-----------+----------+--------------+  LEFT Compressibility Phasicity Spontaneity Properties Thrombus Aging  +----+---------------+---------+-----------+----------+--------------+  CFV  Full            Yes       Yes                                    +----+---------------+---------+-----------+----------+--------------+     Summary: RIGHT: - There is no evidence of deep vein thrombosis in the lower extremity.  - No cystic structure found in the popliteal fossa.  LEFT: - No evidence of common femoral vein obstruction.  *See table(s)  above for measurements and observations.    Preliminary     Procedures Procedures    CRITICAL CARE Performed by: Gwenyth Allegra Murdock Jellison Total critical care time: 35 minutes Critical care time was exclusive of separately billable procedures and treating other patients. Critical care was necessary to treat or prevent imminent or life-threatening deterioration. Critical care was time spent personally by me on the following activities: development of treatment plan with patient and/or surrogate as well as nursing, discussions with consultants, evaluation of patient's response to treatment, examination of patient, obtaining history from patient or surrogate, ordering and performing treatments and interventions, ordering and review of laboratory studies, ordering and review of radiographic studies, pulse oximetry and re-evaluation of patient's condition.   Medications Ordered in ED Medications  acetaminophen (TYLENOL) tablet 500 mg (500 mg Oral Given 07/11/21 0839)  ceFAZolin (ANCEF) IVPB 1 g/50 mL premix (1 g Intravenous New Bag/Given 07/11/21 0838)  fentaNYL (SUBLIMAZE) injection 50 mcg (50 mcg Intravenous Given 07/11/21 0859)    ED Course/ Medical Decision Making/ A&P                           Medical Decision Making Amount and/or Complexity of Data Reviewed Labs: ordered. Radiology: ordered.  Risk OTC drugs. Prescription drug management. Decision regarding hospitalization.    Thomas Ortiz is a 66 y.o. male with a past medical history significant for hypertension, hyperlipidemia, vertigo, and dementia who presents for right leg pain, swelling, and redness.  According to patient, for last few days he has developed redness and pain in his distal right leg.  He has had  previous hardware placed from orthopedic injuries in the past years ago but denies any new skin injuries.  He does not have diabetes in the chart.  He presents because the redness was worsening.  He did not have any history of  DVT or PE by report but on arrival was found to be tachycardic and febrile.  He was denying any cough, congestion, nausea, vomiting, constipation, diarrhea, or urinary symptoms.  He reports the pain was 10 out of 10 in severity.  He was initially assessed and then wait in the waiting room for available bed for further work-up.  Family was present at the bedside and wanted to interpret for him.  On my evaluation, approximately 14 hours and 30 minutes into his ED stay, patient does indeed have evidence of erythema in his right leg.  It is very swollen, tender, and warm.  The redness is streaking up his leg.  I collected a clinical photo which when compared to the x-ray done last night, the redness is now streaking up past his knee and thigh towards his groin.  It is also tender up there.  He otherwise has intact sensation, strength, and pulses distally.  His lungs were clear and chest was nontender.  Abdomen was nontender.  He was tachycardic and warm to the touch.  No other complaints and no other abnormalities on exam initially.  Clinically I am concerned about infection in the leg.  The initial screening team ordered ultrasound to rule out DVT and this will be important.  We will also add on x-ray to look for subcutaneous gas given the progression of the infection even in the hours of being here in the emergency department.  We will add on other labs including lactic acid and will get a likely preadmission COVID test.  Patient's work-up began to return.  COVID and flu negative.  Creatinine slightly elevated at 1.3 but otherwise is electrolytes are reassuring.  He does have leukocytosis but no anemia.  Lactic acid was found to be 2.1 then downtrending on repeat.  Cultures were obtained.  Ultrasound does not show evidence of DVT and x-ray did not show evidence of subcutaneous gas.  No evidence of hardware abnormality.  Due to the rapid progression of the patient's cellulitis, will call for admission for  management.  As this appears to be nonpurulent infection with no history of diabetes and without evidence of necrotizing fasciitis, Ancef was ordered per order set.  Will call medicine for admission.        Final Clinical Impression(s) / ED Diagnoses Final diagnoses:  Cellulitis of right lower extremity  Right leg pain    Clinical Impression: 1. Cellulitis of right lower extremity   2. Right leg pain     Disposition: Admit  This note was prepared with assistance of Dragon voice recognition software. Occasional wrong-word or sound-a-like substitutions may have occurred due to the inherent limitations of voice recognition software.      Thomas Ortiz, Gwenyth Allegra, MD 07/11/21 [phone]

## 2021-07-12 LAB — BASIC METABOLIC PANEL
Anion gap: 12 (ref 5–15)
BUN: 15 mg/dL (ref 8–23)
CO2: 21 mmol/L — ABNORMAL LOW (ref 22–32)
Calcium: 8.7 mg/dL — ABNORMAL LOW (ref 8.9–10.3)
Chloride: 102 mmol/L (ref 98–111)
Creatinine, Ser: 1.11 mg/dL (ref 0.61–1.24)
GFR, Estimated: >60 mL/min (ref >60)
Glucose, Bld: 116 mg/dL — ABNORMAL HIGH (ref 70–99)
Potassium: 3.8 mmol/L (ref 3.5–5.1)
Sodium: 135 mmol/L (ref 135–145)

## 2021-07-12 LAB — CBC
HCT: 37.7 % — ABNORMAL LOW (ref 39.0–52.0)
Hemoglobin: 12.9 g/dL — ABNORMAL LOW (ref 13.0–17.0)
MCH: 32.3 pg (ref 26.0–34.0)
MCHC: 34.2 g/dL (ref 30.0–36.0)
MCV: 94.5 fL (ref 80.0–100.0)
Platelets: 198 10*3/uL (ref 150–400)
RBC: 3.99 MIL/uL — ABNORMAL LOW (ref 4.22–5.81)
RDW: 13.5 % (ref 11.5–15.5)
WBC: 7.7 10*3/uL (ref 4.0–10.5)
nRBC: 0 % (ref 0.0–0.2)

## 2021-07-12 LAB — HIV ANTIBODY (ROUTINE TESTING W REFLEX): HIV Screen 4th Generation wRfx: NONREACTIVE

## 2021-07-12 LAB — MRSA NEXT GEN BY PCR, NASAL: MRSA by PCR Next Gen: NOT DETECTED

## 2021-07-12 MED ORDER — PHENOL 1.4 % MT LIQD: 1.0000 | OROMUCOSAL | Status: DC | PRN

## 2021-07-12 NOTE — Progress Notes (Signed)
PROGRESS NOTE    Thomas Ortiz  ZOX:[id number] DOB: Dec 15, 1955 DOA: 07/10/2021 PCP: Hoy Register, MD   Brief Narrative: 66 year old past medical history significant for dementia, hypertension, hyperlipidemia presented with 3 days history of right leg pain redness and fever.  Evaluation in the ED patient had a temperature of 101, white blood cell 11.2, lactic acid 2.1.  He was a started on cefazolin for cellulitis.   Assessment & Plan:   Principal Problem:   Cellulitis  1-Right Lower extremity Cellulitis: Redness appears the same, plan to continue with IV antibiotics for another day. [place] Negative for DVT White blood cell trending down Check for Diabetes,   2-SIRS: Resolved Sepsis rule out  3-Dementia with occasional sundowning: Continue with Namenda Per daughter he has not been taking any mediations since December.   4-Hypertension: Will hold metoprolol, he was not taking it, and HR has been low side.  Monitor BP   Hyperlipidemia Continue with statins  Estimated body mass index is 20.98 kg/m as calculated from the following:   Height as of this encounter: 5\' 6"  (1.676 m).   Weight as of this encounter: 59 kg.   DVT prophylaxis: Lovenox Code Status: Full code Family Communication: Daughter, over phone Disposition Plan:  Status is: Inpatient  The patient will require care spanning > 2 midnights and should be moved to inpatient because: needs IV antibiotics.        Consultants:  None  Procedures:  Doppler; negative for dvt  Antimicrobials:    Subjective: He is alert, confuse, I spoke with him in spanish. He has some memory deficit.   Objective: Vitals:   07/11/21 1438 07/11/21 1800 07/11/21 1931 07/12/21 0452  BP: 134/84 (!) 108/93 127/86 112/77  Pulse: 85 71 67 64  Resp: (!) 24 16 18 18   Temp:  98.4 F (36.9 C) 98.2 F (36.8 C) 98.4 F (36.9 C)  TempSrc:      SpO2: 98% 100% 99% 99%  Weight:      Height:        Intake/Output Summary (Last  24 hours) at 07/12/2021 0744 Last data filed at 07/12/2021 0600 Gross per 24 hour  Intake 189.62 ml  Output 0 ml  Net 189.62 ml   Filed Weights   07/10/21 1847  Weight: 59 kg    Examination:  General exam: Appears calm and comfortable  Respiratory system: Clear to auscultation. Respiratory effort normal. Cardiovascular system: S1 & S2 heard, RRR.  Gastrointestinal system: Abdomen is nondistended, soft and nontender. No organomegaly or masses felt. Normal bowel sounds heard. Central nervous system: Alert and oriented. No focal neurological deficits. Extremities: Right LE with redness.     Data Reviewed: I have personally reviewed following labs and imaging studies  CBC: Recent Labs  Lab 07/10/21 1921 07/12/21 0145  WBC 11.0* 7.7  NEUTROABS 9.1*  --   HGB 14.2 12.9*  HCT 42.4 37.7*  MCV 95.5 94.5  PLT 211 198   Basic Metabolic Panel: Recent Labs  Lab 07/10/21 1921 07/12/21 0145  NA 135 135  K 3.5 3.8  CL 99 102  CO2 21* 21*  GLUCOSE 102* 116*  BUN 16 15  CREATININE 1.36* 1.11  CALCIUM 9.1 8.7*   GFR: Estimated Creatinine Clearance: 53.9 mL/min (by C-G formula based on SCr of 1.11 mg/dL). Liver Function Tests: No results for input(s): AST, ALT, ALKPHOS, BILITOT, PROT, ALBUMIN in the last 168 hours. No results for input(s): LIPASE, AMYLASE in the last 168 hours. No results for input(s): AMMONIA  in the last 168 hours. Coagulation Profile: No results for input(s): INR, PROTIME in the last 168 hours. Cardiac Enzymes: No results for input(s): CKTOTAL, CKMB, CKMBINDEX, TROPONINI in the last 168 hours. BNP (last 3 results) No results for input(s): PROBNP in the last 8760 hours. HbA1C: No results for input(s): HGBA1C in the last 72 hours. CBG: No results for input(s): GLUCAP in the last 168 hours. Lipid Profile: No results for input(s): CHOL, HDL, LDLCALC, TRIG, CHOLHDL, LDLDIRECT in the last 72 hours. Thyroid Function Tests: No results for input(s): TSH,  T4TOTAL, FREET4, T3FREE, THYROIDAB in the last 72 hours. Anemia Panel: No results for input(s): VITAMINB12, FOLATE, FERRITIN, TIBC, IRON, RETICCTPCT in the last 72 hours. Sepsis Labs: Recent Labs  Lab 07/11/21 0835 07/11/21 0920  LATICACIDVEN 2.1* 1.3    Recent Results (from the past 240 hour(s))  Resp Panel by RT-PCR (Flu A&B, Covid) Nasopharyngeal Swab     Status: None   Collection Time: 07/11/21  9:00 AM   Specimen: Nasopharyngeal Swab; Nasopharyngeal(NP) swabs in vial transport medium  Result Value Ref Range Status   SARS Coronavirus 2 by RT PCR NEGATIVE NEGATIVE Final    Comment: (NOTE) SARS-CoV-2 target nucleic acids are NOT DETECTED.  The SARS-CoV-2 RNA is generally detectable in upper respiratory specimens during the acute phase of infection. The lowest concentration of SARS-CoV-2 viral copies this assay can detect is 138 copies/mL. A negative result does not preclude SARS-Cov-2 infection and should not be used as the sole basis for treatment or other patient management decisions. A negative result may occur with  improper specimen collection/handling, submission of specimen other than nasopharyngeal swab, presence of viral mutation(s) within the areas targeted by this assay, and inadequate number of viral copies(<138 copies/mL). A negative result must be combined with clinical observations, patient history, and epidemiological information. The expected result is Negative.  Fact Sheet for Patients:  BloggerCourse.com  Fact Sheet for Healthcare Providers:  SeriousBroker.it  This test is no t yet approved or cleared by the [place] FDA and  has been authorized for detection and/or diagnosis of SARS-CoV-2 by FDA under an Emergency Use Authorization (EUA). This EUA will remain  in effect (meaning this test can be used) for the duration of the COVID-19 declaration under Section 564(b)(1) of the Act, 21 U.S.C.section  360bbb-3(b)(1), unless the authorization is terminated  or revoked sooner.       Influenza A by PCR NEGATIVE NEGATIVE Final   Influenza B by PCR NEGATIVE NEGATIVE Final    Comment: (NOTE) The Xpert Xpress SARS-CoV-2/FLU/RSV plus assay is intended as an aid in the diagnosis of influenza from Nasopharyngeal swab specimens and should not be used as a sole basis for treatment. Nasal washings and aspirates are unacceptable for Xpert Xpress SARS-CoV-2/FLU/RSV testing.  Fact Sheet for Patients: BloggerCourse.com  Fact Sheet for Healthcare Providers: SeriousBroker.it  This test is not yet approved or cleared by the [place] FDA and has been authorized for detection and/or diagnosis of SARS-CoV-2 by FDA under an Emergency Use Authorization (EUA). This EUA will remain in effect (meaning this test can be used) for the duration of the COVID-19 declaration under Section 564(b)(1) of the Act, 21 U.S.C. section 360bbb-3(b)(1), unless the authorization is terminated or revoked.  Performed at [facility], [address] [place], [place] [address]   Blood culture (routine x 2)     Status: None (Preliminary result)   Collection Time: 07/11/21  9:10 AM   Specimen: BLOOD LEFT ARM  Result Value Ref Range Status   Specimen Description BLOOD LEFT ARM  Final   Special Requests   Final    BOTTLES DRAWN AEROBIC AND ANAEROBIC Blood Culture adequate volume   Culture   Final    NO GROWTH < 24 HOURS Performed at [facility], [address] [place], [place] [address]    Report Status PENDING  Incomplete  Blood culture (routine x 2)     Status: None (Preliminary result)   Collection Time: 07/11/21  9:20 AM   Specimen: BLOOD RIGHT ARM  Result Value Ref Range Status   Specimen Description BLOOD RIGHT ARM  Final   Special Requests   Final    BOTTLES DRAWN AEROBIC AND ANAEROBIC Blood Culture adequate volume   Culture   Final    NO  GROWTH < 24 HOURS Performed at [facility], [address] [place], [place] [address]    Report Status PENDING  Incomplete         Radiology Studies: DG Tibia/Fibula Right  Result Date: 07/11/2021 CLINICAL DATA:  Leg infection. EXAM: RIGHT TIBIA AND FIBULA - 2 VIEW COMPARISON:  None. FINDINGS: There is no evidence of acute fracture or other focal bone lesions. Status post surgical internal fixation of old distal right fibular fracture. Soft tissues are unremarkable. IMPRESSION: No acute abnormality seen. Electronically Signed   By: Lupita RaiderJames  Green Jr M.D.   On: 07/11/2021 09:01   VAS US LOWER EXTREMITY VENOUS (DVT)  Result Date: 07/12/2021  Lower Venous DVT Study Patient Name:  Thomas Ortiz  Date of Exam:   07/11/2021 Medical Rec #: [id number]    Accession #:    [id number] Date of Birth: [birth year]   Patient Gender: M Patient Age:   [age] years Exam [facility]  Procedure:      VAS US LOWER EXTREMITY VENOUS (DVT) Referring Phys: Delice BisonHRISTOPHER GROCE --------------------------------------------------------------------------------  Indications: Swelling, and Edema.  Comparison Study: 03/15/19 prior Performing Technologist: Argentina PonderMegan Stricklin RVS  Examination Guidelines: A complete evaluation includes B-mode imaging, spectral Doppler, color Doppler, and power Doppler as needed of all accessible portions of each vessel. Bilateral testing is considered an integral part of a complete examination. Limited examinations for reoccurring indications may be performed as noted. The reflux portion of the exam is performed with the patient in reverse Trendelenburg.  +---------+---------------+---------+-----------+----------+-------------------+  RIGHT     Compressibility Phasicity Spontaneity Properties Thrombus Aging       +---------+---------------+---------+-----------+----------+-------------------+  CFV       Full            Yes       Yes                                          +---------+---------------+---------+-----------+----------+-------------------+  SFJ       Full                                                                  +---------+---------------+---------+-----------+----------+-------------------+  FV Prox   Full                                                                  +---------+---------------+---------+-----------+----------+-------------------+  FV Mid    Full                                                                  +---------+---------------+---------+-----------+----------+-------------------+  FV Distal Full                                                                  +---------+---------------+---------+-----------+----------+-------------------+  PFV       Full                                                                  +---------+---------------+---------+-----------+----------+-------------------+  POP       Full            Yes       Yes                                         +---------+---------------+---------+-----------+----------+-------------------+  PTV       Full                                                                  +---------+---------------+---------+-----------+----------+-------------------+  PERO                                                       Not well visualized  +---------+---------------+---------+-----------+----------+-------------------+   +----+---------------+---------+-----------+----------+--------------+  LEFT Compressibility Phasicity Spontaneity Properties Thrombus Aging  +----+---------------+---------+-----------+----------+--------------+  CFV  Full            Yes       Yes                                    +----+---------------+---------+-----------+----------+--------------+     Summary: RIGHT: - There is no evidence of deep vein thrombosis in the lower extremity.  - No cystic structure found in the popliteal fossa.  LEFT: - No evidence of common femoral vein obstruction.  *See table(s)  above for measurements and observations. Electronically signed by Heath Lark on 07/12/2021 at 5:02:34 AM.    Final         Scheduled Meds:  enoxaparin (LOVENOX) injection  40 mg Subcutaneous Q24H   fluticasone  2 spray Each Nare Daily   loratadine  10 mg Oral Daily   memantine  10 mg Oral BID  metoprolol succinate  25 mg Oral Daily   OLANZapine  10 mg Oral QHS   QUEtiapine  25 mg Oral BID   rosuvastatin  40 mg Oral Daily   sertraline  50 mg Oral Daily   Continuous Infusions:   ceFAZolin (ANCEF) IV 1 g (07/12/21 0617)     LOS: 0 days    Time spent: 35 minutes    Nathalya Wolanski A Ellice Boultinghouse, MD [facility]   If 7PM-7AM, please contact night-coverage [email or web address]  07/12/2021, 7:44 AM

## 2021-07-12 NOTE — Progress Notes (Signed)
°  Transition of Care (TOC) Screening Note   Patient Details  Name: Thomas Ortiz Date of Birth: 25-Jun-1955   Transition of Care [facility]) CM/SW Contact:    Tom-Johnson, Hershal Coria, RN Phone Number: 07/12/2021, 5:19 PM  Transition of Care Department [facility]) has reviewed patient and no TOC needs have been identified at this time. We will continue to monitor patient advancement through interdisciplinary progression rounds. If new patient transition needs arise, please place a TOC consult.

## 2021-07-13 LAB — HEMOGLOBIN A1C
Hgb A1c MFr Bld: 5.3 % (ref 4.8–5.6)
Mean Plasma Glucose: 105.41 mg/dL

## 2021-07-13 MED ORDER — QUETIAPINE FUMARATE 25 MG PO TABS
25.0000 mg | ORAL_TABLET | Freq: Two times a day (BID) | ORAL | Status: DC | PRN
  Administered 2021-07-13: 25 mg via ORAL
  Filled 2021-07-13 (×2): qty 1

## 2021-07-13 MED ORDER — HALOPERIDOL LACTATE 5 MG/ML IJ SOLN
1.0000 mg | Freq: Once | INTRAMUSCULAR | Status: AC
  Administered 2021-07-13: 1 mg via INTRAVENOUS
  Filled 2021-07-13: qty 1

## 2021-07-13 NOTE — Progress Notes (Signed)
PROGRESS NOTE    Thomas Ortiz  PJK:[id number] DOB: 01/20/1956 DOA: 07/10/2021 PCP: Hoy Register, MD   Brief Narrative: 66 year old past medical history significant for dementia, hypertension, hyperlipidemia presented with 3 days history of right leg pain redness and fever.  Evaluation in the ED patient had a temperature of 101, white blood cell 11.2, lactic acid 2.1.  He was a started on cefazolin for cellulitis.   Assessment & Plan:   Principal Problem:   Cellulitis  1-Right Lower extremity Cellulitis: [place] Negative for DVT White blood cell normalized A1c: 5.3 Improving, continue with IV antibiotics for another day.   2-SIRS: Resolved Sepsis rule out  3-Dementia with occasional sundowning: Continue with Namenda Per daughter he has not been taking any mediations since December.  Ammonia 16, RPR non reactive. TSH normal last year.  Check B 12.   4-Hypertension: Will hold metoprolol, he was not taking it, and HR has been low side.  Monitor BP   Hyperlipidemia Continue with statins  Estimated body mass index is 19.75 kg/m as calculated from the following:   Height as of this encounter: 5\' 6"  (1.676 m).   Weight as of this encounter: 55.5 kg.   DVT prophylaxis: Lovenox Code Status: Full code Family Communication: Daughter, over phone 1/28 Disposition Plan:  Status is: Inpatient  The patient will require care spanning > 2 midnights and should be moved to inpatient because: needs IV antibiotics.        Consultants:  None  Procedures:  Doppler; negative for dvt  Antimicrobials:    Subjective: He is alert.  He has some paranoid.   Objective: Vitals:   07/12/21 2036 07/13/21 0601 07/13/21 0603 07/13/21 0826  BP: (!) 142/92 120/89 120/89 132/77  Pulse: 78 68 68 71  Resp: 18 18 18 18   Temp: 98.2 F (36.8 C) 98.1 F (36.7 C) 98.1 F (36.7 C) 97.9 F (36.6 C)  TempSrc: Oral Oral Oral Oral  SpO2: 98% 98% 98% 99%  Weight:  55.5 kg    Height:         Intake/Output Summary (Last 24 hours) at 07/13/2021 1312 Last data filed at 07/12/2021 2200 Gross per 24 hour  Intake 630.38 ml  Output --  Net 630.38 ml    Filed Weights   07/10/21 1847 07/13/21 0601  Weight: 59 kg 55.5 kg    Examination:  General exam: NAD Respiratory system: CTA Cardiovascular system: S 1, S 2 RRR Gastrointestinal system: BS presents, soft, nt Central nervous system:alert, confuse, paranoid thoughts.  Extremities: Right LE with mild improvement redness.     Data Reviewed: I have personally reviewed following labs and imaging studies  CBC: Recent Labs  Lab 07/10/21 1921 07/12/21 0145  WBC 11.0* 7.7  NEUTROABS 9.1*  --   HGB 14.2 12.9*  HCT 42.4 37.7*  MCV 95.5 94.5  PLT 211 198    Basic Metabolic Panel: Recent Labs  Lab 07/10/21 1921 07/12/21 0145  NA 135 135  K 3.5 3.8  CL 99 102  CO2 21* 21*  GLUCOSE 102* 116*  BUN 16 15  CREATININE 1.36* 1.11  CALCIUM 9.1 8.7*    GFR: Estimated Creatinine Clearance: 50.7 mL/min (by C-G formula based on SCr of 1.11 mg/dL). Liver Function Tests: No results for input(s): AST, ALT, ALKPHOS, BILITOT, PROT, ALBUMIN in the last 168 hours. No results for input(s): LIPASE, AMYLASE in the last 168 hours. No results for input(s): AMMONIA in the last 168 hours. Coagulation Profile: No results for input(s): INR,  PROTIME in the last 168 hours. Cardiac Enzymes: No results for input(s): CKTOTAL, CKMB, CKMBINDEX, TROPONINI in the last 168 hours. BNP (last 3 results) No results for input(s): PROBNP in the last 8760 hours. HbA1C: Recent Labs    07/13/21 0157  HGBA1C 5.3   CBG: No results for input(s): GLUCAP in the last 168 hours. Lipid Profile: No results for input(s): CHOL, HDL, LDLCALC, TRIG, CHOLHDL, LDLDIRECT in the last 72 hours. Thyroid Function Tests: No results for input(s): TSH, T4TOTAL, FREET4, T3FREE, THYROIDAB in the last 72 hours. Anemia Panel: No results for input(s): VITAMINB12,  FOLATE, FERRITIN, TIBC, IRON, RETICCTPCT in the last 72 hours. Sepsis Labs: Recent Labs  Lab 07/11/21 0835 07/11/21 0920  LATICACIDVEN 2.1* 1.3     Recent Results (from the past 240 hour(s))  Resp Panel by RT-PCR (Flu A&B, Covid) Nasopharyngeal Swab     Status: None   Collection Time: 07/11/21  9:00 AM   Specimen: Nasopharyngeal Swab; Nasopharyngeal(NP) swabs in vial transport medium  Result Value Ref Range Status   SARS Coronavirus 2 by RT PCR NEGATIVE NEGATIVE Final    Comment: (NOTE) SARS-CoV-2 target nucleic acids are NOT DETECTED.  The SARS-CoV-2 RNA is generally detectable in upper respiratory specimens during the acute phase of infection. The lowest concentration of SARS-CoV-2 viral copies this assay can detect is 138 copies/mL. A negative result does not preclude SARS-Cov-2 infection and should not be used as the sole basis for treatment or other patient management decisions. A negative result may occur with  improper specimen collection/handling, submission of specimen other than nasopharyngeal swab, presence of viral mutation(s) within the areas targeted by this assay, and inadequate number of viral copies(<138 copies/mL). A negative result must be combined with clinical observations, patient history, and epidemiological information. The expected result is Negative.  Fact Sheet for Patients:  BloggerCourse.comhttps://www.fda.gov/media/152166/download  Fact Sheet for Healthcare Providers:  SeriousBroker.ithttps://www.fda.gov/media/152162/download  This test is no t yet approved or cleared by the [place] FDA and  has been authorized for detection and/or diagnosis of SARS-CoV-2 by FDA under an Emergency Use Authorization (EUA). This EUA will remain  in effect (meaning this test can be used) for the duration of the COVID-19 declaration under Section 564(b)(1) of the Act, 21 U.S.C.section 360bbb-3(b)(1), unless the authorization is terminated  or revoked sooner.       Influenza A by PCR  NEGATIVE NEGATIVE Final   Influenza B by PCR NEGATIVE NEGATIVE Final    Comment: (NOTE) The Xpert Xpress SARS-CoV-2/FLU/RSV plus assay is intended as an aid in the diagnosis of influenza from Nasopharyngeal swab specimens and should not be used as a sole basis for treatment. Nasal washings and aspirates are unacceptable for Xpert Xpress SARS-CoV-2/FLU/RSV testing.  Fact Sheet for Patients: BloggerCourse.comhttps://www.fda.gov/media/152166/download  Fact Sheet for Healthcare Providers: SeriousBroker.ithttps://www.fda.gov/media/152162/download  This test is not yet approved or cleared by the [place] FDA and has been authorized for detection and/or diagnosis of SARS-CoV-2 by FDA under an Emergency Use Authorization (EUA). This EUA will remain in effect (meaning this test can be used) for the duration of the COVID-19 declaration under Section 564(b)(1) of the Act, 21 U.S.C. section 360bbb-3(b)(1), unless the authorization is terminated or revoked.  Performed at [facility], [address] [place], [place] [address]   Blood culture (routine x 2)     Status: None (Preliminary result)   Collection Time: 07/11/21  9:10 AM   Specimen: BLOOD LEFT ARM  Result Value Ref Range Status   Specimen Description  BLOOD LEFT ARM  Final   Special Requests   Final    BOTTLES DRAWN AEROBIC AND ANAEROBIC Blood Culture adequate volume   Culture   Final    NO GROWTH 2 DAYS Performed at [facility], [address], [place], [place] [address]    Report Status PENDING  Incomplete  Blood culture (routine x 2)     Status: None (Preliminary result)   Collection Time: 07/11/21  9:20 AM   Specimen: BLOOD RIGHT ARM  Result Value Ref Range Status   Specimen Description BLOOD RIGHT ARM  Final   Special Requests   Final    BOTTLES DRAWN AEROBIC AND ANAEROBIC Blood Culture adequate volume   Culture   Final    NO GROWTH 2 DAYS Performed at [facility], [address], [place], [place] [address]    Report Status  PENDING  Incomplete  MRSA Next Gen by PCR, Nasal     Status: None   Collection Time: 07/12/21 12:32 PM   Specimen: Nasal Mucosa; Nasal Swab  Result Value Ref Range Status   MRSA by PCR Next Gen NOT DETECTED NOT DETECTED Final    Comment: (NOTE) The GeneXpert MRSA Assay (FDA approved for NASAL specimens only), is one component of a comprehensive MRSA colonization surveillance program. It is not intended to diagnose MRSA infection nor to guide or monitor treatment for MRSA infections. Test performance is not FDA approved in patients less than 72 years old. Performed at [facility], [address], [place], [place] [address]           Radiology Studies: No results found.      Scheduled Meds:  enoxaparin (LOVENOX) injection  40 mg Subcutaneous Q24H   fluticasone  2 spray Each Nare Daily   loratadine  10 mg Oral Daily   memantine  10 mg Oral BID   rosuvastatin  40 mg Oral Daily   sertraline  50 mg Oral Daily   Continuous Infusions:   ceFAZolin (ANCEF) IV 1 g (07/13/21 0509)     LOS: 1 day    Time spent: 35 minutes    Yahayra Geis A Elsey Holts, MD [facility]   If 7PM-7AM, please contact night-coverage [email or web address]  07/13/2021, 1:12 PM

## 2021-07-13 NOTE — Evaluation (Signed)
Occupational Therapy Evaluation Patient Details Name: Thomas Ortiz MRN: [id number] DOB: [birth year] Today's Date: 07/13/2021   History of Present Illness Pt is a 66 y/o mostly Spanish-speaking male admitted 1/25 with 3days of right leg rash, pain and fever  Work up for R LE cellulitis, SIRS resolved.  PMHx:  dementia, HTN   Clinical Impression   Pt is a 66 yo male s/p RLE cellulitis. Pt currently, with dementia at baseline and use of interpreter was not super productive as pt did not always answer and often the question or command had to be repeated. Pt was supervisionA for bed mobility and minA overall for RW ambulation from bed to sink back to bed. Pt unable to safely use RW and difficulty describing it to pt. Pain reported in RLE.  Pt minA overall for ADL tasks. Pt would benefit from continued OT skilled services. No family present at this time. OT following acutely.     Recommendations for follow up therapy are one component of a multi-disciplinary discharge planning process, led by the attending physician.  Recommendations may be updated based on patient status, additional functional criteria and insurance authorization.   Follow Up Recommendations  No OT follow up    Assistance Recommended at Discharge Set up Supervision/Assistance  Patient can return home with the following A little help with walking and/or transfers;A little help with bathing/dressing/bathroom    Functional Status Assessment  Patient has had a recent decline in their functional status and demonstrates the ability to make significant improvements in function in a reasonable and predictable amount of time.  Equipment Recommendations  BSC/3in1    Recommendations for Other Services       Precautions / Restrictions Precautions Precautions: Fall      Mobility Bed Mobility Overal bed mobility: Needs Assistance Bed Mobility: Supine to Sit, Sit to Supine     Supine to sit: Supervision Sit to supine:  Supervision        Transfers Overall transfer level: Needs assistance   Transfers: Sit to/from Stand Sit to Stand: Min assist           General transfer comment: min cues and assist to stand, no assist to control descent      Balance Overall balance assessment: Needs assistance Sitting-balance support: Feet supported, No upper extremity supported Sitting balance-Leahy Scale: Good     Standing balance support: During functional activity, Single extremity supported Standing balance-Leahy Scale: Poor Standing balance comment: uses RW very  awkwardly and diffculty explaining with interpreter how to use correctly.                           ADL either performed or assessed with clinical judgement   ADL                                               Vision Baseline Vision/History: 0 No visual deficits Ability to See in Adequate Light: 0 Adequate Patient Visual Report: No change from baseline Vision Assessment?: No apparent visual deficits     Perception     Praxis      Pertinent Vitals/Pain Pain Assessment Pain Assessment: Faces Faces Pain Scale: Hurts little more Pain Location: R lower leg Pain Descriptors / Indicators: Discomfort, Grimacing, Guarding Pain Intervention(s): Limited activity within patient's tolerance     Hand Dominance Right  Extremity/Trunk Assessment Upper Extremity Assessment Upper Extremity Assessment: Overall WFL for tasks assessed   Lower Extremity Assessment Lower Extremity Assessment: RLE deficits/detail RLE Deficits / Details: sorenes, poor movement LLE Deficits / Details: WFL       Communication Communication Communication: Other (comment) (Soft spoken, often tangential and off context even to a spanish speaker.,)   Cognition Arousal/Alertness: Awake/alert Behavior During Therapy: Flat affect, WFL for tasks assessed/performed Overall Cognitive Status: History of cognitive impairments - at  baseline                                       General Comments  Eulis Foster interpreter [phone]    Exercises     Shoulder Instructions      Home Living Family/patient expects to be discharged to:: Private residence Living Arrangements: Children Available Help at Discharge: Family Type of Home: House                           Additional Comments: Pt with dementia and unable to answer questions with interpreter. No family present.      Prior Functioning/Environment               Mobility Comments: TBA ADLs Comments: Pt with dementia and unable to answer questions with interpreter. No family present.        OT Problem List: Decreased strength;Decreased activity tolerance;Impaired balance (sitting and/or standing);Decreased safety awareness;Pain      OT Treatment/Interventions: Self-care/ADL training;Therapeutic exercise;Energy conservation;DME and/or AE instruction;Therapeutic activities;Patient/family education;Balance training    OT Goals(Current goals can be found in the care plan section) Acute Rehab OT Goals Patient Stated Goal: to go home OT Goal Formulation: Patient unable to participate in goal setting Time For Goal Achievement: 07/27/21 Potential to Achieve Goals: Good ADL Goals Pt Will Perform Lower Body Dressing: with min guard assist;sitting/lateral leans;sit to/from stand Additional ADL Goal #1: Pt will increase to minguardA with all ADL functional transfers.  OT Frequency: Min 2X/week    Co-evaluation              AM-PAC OT "6 Clicks" Daily Activity     Outcome Measure Help from another person eating meals?: None Help from another person taking care of personal grooming?: A Little Help from another person toileting, which includes using toliet, bedpan, or urinal?: A Little Help from another person bathing (including washing, rinsing, drying)?: A Little Help from another person to put on and taking off regular upper body  clothing?: A Little Help from another person to put on and taking off regular lower body clothing?: A Little 6 Click Score: 19   End of Session Equipment Utilized During Treatment: Gait belt;Rolling walker (2 wheels) Nurse Communication: Mobility status  Activity Tolerance: Patient limited by pain Patient left: in bed;with call bell/phone within reach;with bed alarm set  OT Visit Diagnosis: Muscle weakness (generalized) (M62.81);Pain                Time: 3846-6599 OT Time Calculation (min): 21 min Charges:  OT General Charges $OT Visit: 1 Visit OT Evaluation $OT Eval Moderate Complexity: 1 Mod  Flora Lipps, OTR/L Acute Rehabilitation Services Pager: [phone] Office: [phone]   Lonzo Cloud 07/13/2021, 2:28 PM

## 2021-07-13 NOTE — Evaluation (Signed)
Physical Therapy Evaluation Patient Details Name: Thomas Ortiz MRN: [id number] DOB: 09-26-1955 Today's Date: 07/13/2021  History of Present Illness  Pt is a 66 y/o mostly Spanish-speaking male admitted 1/25 with 3days of right leg rash, pain and fever  Work up for R LE cellulitis, SIRS resolved.  PMHx:  dementia, HTN  Clinical Impression  Pt admitted with/for cellulitis on R LE.  The pain limiting pt's mobility and pt needing min guard to light min assist for OOB mobility.  Pt currently limited functionally due to the problems listed below.  (see problems list.)  Pt will benefit from PT to maximize function and safety to be able to get home safely with available assist.        Recommendations for follow up therapy are one component of a multi-disciplinary discharge planning process, led by the attending physician.  Recommendations may be updated based on patient status, additional functional criteria and insurance authorization.  Follow Up Recommendations No PT follow up    Assistance Recommended at Discharge Frequent or constant Supervision/Assistance  Patient can return home with the following  A little help with walking and/or transfers;A little help with bathing/dressing/bathroom;Assistance with cooking/housework;Assistance with feeding;Direct supervision/assist for medications management;Direct supervision/assist for financial management;Help with stairs or ramp for entrance    Equipment Recommendations Other (comment);Rolling walker (2 wheels) (could benefit from RW in the home if he does not have one.)  Recommendations for Other Services       Functional Status Assessment Patient has had a recent decline in their functional status and demonstrates the ability to make significant improvements in function in a reasonable and predictable amount of time.     Precautions / Restrictions Precautions Precautions: Fall Restrictions Weight Bearing Restrictions: No      Mobility  Bed  Mobility Overal bed mobility: Needs Assistance Bed Mobility: Supine to Sit, Sit to Supine     Supine to sit: Supervision Sit to supine: Supervision        Transfers Overall transfer level: Needs assistance   Transfers: Sit to/from Stand Sit to Stand: Min assist           General transfer comment: min cues and assist to stand, no assist to control descent    Ambulation/Gait Ambulation/Gait assistance: Min guard, Min assist (min without AD, min guard with use of the RW) Gait Distance (Feet): 70 Feet Assistive device: Rolling walker (2 wheels), None, 1 person hand held assist Gait Pattern/deviations: Step-through pattern   Gait velocity interpretation: <1.31 ft/sec, indicative of household ambulator   General Gait Details: mildly antalgic on the right overall.  Less assist need with use of the RW, but pt does not coordinate well with the RW.  Stairs            Wheelchair Mobility    Modified Rankin (Stroke Patients Only)       Balance Overall balance assessment: Needs assistance Sitting-balance support: Feet supported, No upper extremity supported Sitting balance-Leahy Scale: Good     Standing balance support: During functional activity, Single extremity supported Standing balance-Leahy Scale: Poor                               Pertinent Vitals/Pain Pain Assessment Pain Assessment: Faces Faces Pain Scale: Hurts little more Pain Location: R lower leg Pain Descriptors / Indicators: Discomfort, Grimacing, Guarding Pain Intervention(s): Limited activity within patient's tolerance, Monitored during session    Home Living Family/patient expects to be discharged  to:: Private residence Living Arrangements: Children                 Additional Comments: TBA    Prior Function               Mobility Comments: TBA       Hand Dominance        Extremity/Trunk Assessment   Upper Extremity Assessment Upper Extremity Assessment:  Overall WFL for tasks assessed    Lower Extremity Assessment Lower Extremity Assessment: RLE deficits/detail;LLE deficits/detail RLE Deficits / Details: antalgic with movement and gait, moves against gravity, maintains w/bearing during gait without assist LLE Deficits / Details: WFL       Communication   Communication: Other (comment) (Soft spoken, often tangential and off context even to a spanish speaker.,)  Cognition Arousal/Alertness: Awake/alert Behavior During Therapy: Flat affect, WFL for tasks assessed/performed Overall Cognitive Status: History of cognitive impairments - at baseline                                          General Comments      Exercises     Assessment/Plan    PT Assessment Patient needs continued PT services  PT Problem List Decreased activity tolerance;Decreased balance;Decreased mobility;Decreased knowledge of use of DME;Pain       PT Treatment Interventions DME instruction;Gait training;Functional mobility training;Therapeutic activities;Balance training;Patient/family education    PT Goals (Current goals can be found in the Care Plan section)  Acute Rehab PT Goals Patient Stated Goal: pt unable to participate PT Goal Formulation: Patient unable to participate in goal setting Time For Goal Achievement: 07/19/21 Potential to Achieve Goals: Good    Frequency Min 3X/week     Co-evaluation               AM-PAC PT "6 Clicks" Mobility  Outcome Measure Help needed turning from your back to your side while in a flat bed without using bedrails?: A Little Help needed moving from lying on your back to sitting on the side of a flat bed without using bedrails?: A Little Help needed moving to and from a bed to a chair (including a wheelchair)?: A Little Help needed standing up from a chair using your arms (e.g., wheelchair or bedside chair)?: A Little Help needed to walk in hospital room?: A Little Help needed climbing 3-5  steps with a railing? : A Little 6 Click Score: 18    End of Session   Activity Tolerance: Patient tolerated treatment well;Patient limited by pain Patient left: in bed;with call bell/phone within reach;with bed alarm set Nurse Communication: Mobility status PT Visit Diagnosis: Unsteadiness on feet (R26.81);Other abnormalities of gait and mobility (R26.89);Pain Pain - Right/Left: Right Pain - part of body: Leg    Time: 1004-1040 PT Time Calculation (min) (ACUTE ONLY): 36 min   Charges:   PT Evaluation $PT Eval Moderate Complexity: 1 Mod PT Treatments $Gait Training: 8-22 mins        07/13/2021  Jacinto Halim., PT Acute Rehabilitation Services [phone]  (pager) [phone]  (office)  Eliseo Gum Kirti Carl 07/13/2021, 10:54 AM

## 2021-07-14 LAB — BASIC METABOLIC PANEL
Anion gap: 10 (ref 5–15)
BUN: 12 mg/dL (ref 8–23)
CO2: 23 mmol/L (ref 22–32)
Calcium: 8.9 mg/dL (ref 8.9–10.3)
Chloride: 104 mmol/L (ref 98–111)
Creatinine, Ser: 1.06 mg/dL (ref 0.61–1.24)
GFR, Estimated: >60 mL/min (ref >60)
Glucose, Bld: 89 mg/dL (ref 70–99)
Potassium: 4 mmol/L (ref 3.5–5.1)
Sodium: 137 mmol/L (ref 135–145)

## 2021-07-14 LAB — VITAMIN B12: Vitamin B-12: 986 pg/mL — ABNORMAL HIGH (ref 180–914)

## 2021-07-14 MED ORDER — CEPHALEXIN 500 MG PO CAPS
500.0000 mg | ORAL_CAPSULE | Freq: Three times a day (TID) | ORAL | 0 refills | Status: AC
  Filled 2021-07-14: qty 15, 5d supply, fill #0

## 2021-07-14 NOTE — Discharge Summary (Signed)
Physician Discharge Summary  Thomas QualeJose Ortiz ZOX:[id number] DOB: 1955-11-01 DOA: 07/10/2021  PCP: Hoy RegisterNewlin, Enobong, MD  Admit date: 07/10/2021 Discharge date: 07/14/2021  Admitted From: Home  Disposition: Home   Recommendations for Outpatient Follow-up:  Follow up with PCP in 1-2 weeks Please obtain BMP/CBC in one week   Home Health: None  Discharge Condition: Stable.  CODE STATUS: Full code Diet recommendation: Heart Healthy  Brief/Interim Summary: 66 year old past medical history significant for dementia, hypertension, hyperlipidemia presented with 3 days history of right leg pain redness and fever.   Evaluation in the ED patient had a temperature of 101, white blood cell 11.2, lactic acid 2.1.  He was a started on cefazolin for cellulitis.    1-Right Lower extremity Cellulitis: US Negative for DVT White blood cell normalized A1c: 5.3 Improving, discharge on keflex TID for 5 more days.    2-SIRS: Resolved Sepsis rule out   3-Dementia with occasional sundowning: Continue with Namenda Per daughter he has not been taking any mediations since December.  Ammonia 16, RPR non reactive. TSH normal last year.  B 12. normal.  Continue follow up out patient neurology    4-Hypertension: Will hold metoprolol, he was not taking it, and HR has been low side.  Monitor BP    Hyperlipidemia Continue with statins   Estimated body mass index is 19.75 kg/m as calculated from the following:   Height as of this encounter: 5\' 6"  (1.676 m).   Weight as of this encounter: 55.5 kg.      Discharge Diagnoses:  Principal Problem:   Cellulitis    Discharge Instructions  Discharge Instructions     Diet - low sodium heart healthy   Complete by: As directed    Increase activity slowly   Complete by: As directed       Allergies as of 07/14/2021   No Known Allergies      Medication List     STOP taking these medications    amLODipine 5 MG tablet Commonly known as: NORVASC    cetirizine 10 MG tablet Commonly known as: ZYRTEC   meclizine 25 MG tablet Commonly known as: ANTIVERT   metoprolol succinate 25 MG 24 hr tablet Commonly known as: TOPROL-XL   OLANZapine 10 MG tablet Commonly known as: ZyPREXA       TAKE these medications    cephALEXin 500 MG capsule Commonly known as: KEFLEX Take 1 capsule (500 mg total) by mouth 3 (three) times daily for 5 days.   fluticasone 50 MCG/ACT nasal spray Commonly known as: FLONASE Place 2 sprays into both nostrils daily.   memantine 10 MG tablet Commonly known as: Namenda Take 1 tablet (10 mg total) by mouth 2 (two) times daily.   QUEtiapine 25 MG tablet Commonly known as: SEROquel Take 1 tablet at night for agitation, paranoia What changed: Another medication with the same name was removed. Continue taking this medication, and follow the directions you see here.   rosuvastatin 40 MG tablet Commonly known as: CRESTOR Tome 1 tableta (40 mg en total) por va oral diariamente. (TAKE 1 TABLET (40 MG TOTAL) BY MOUTH DAILY.)   sertraline 50 MG tablet Commonly known as: Zoloft Tome 1 tableta (50 mg en total) por va oral diariamente. (Take 1 tablet (50 mg total) by mouth daily.)        No Known Allergies  Consultations: None   Procedures/Studies: DG Tibia/Fibula Right  Result Date: 07/11/2021 CLINICAL DATA:  Leg infection. EXAM: RIGHT TIBIA AND FIBULA - 2  VIEW COMPARISON:  None. FINDINGS: There is no evidence of acute fracture or other focal bone lesions. Status post surgical internal fixation of old distal right fibular fracture. Soft tissues are unremarkable. IMPRESSION: No acute abnormality seen. Electronically Signed   By: Lupita Raider M.D.   On: 07/11/2021 09:01   VAS [place] LOWER EXTREMITY VENOUS (DVT)  Result Date: 07/12/2021  Lower Venous DVT Study Patient Name:  Thomas Ortiz  Date of Exam:   07/11/2021 Medical Rec #: [id number]    Accession #:    [id number] Date of Birth: February 07, 1956   Patient  Gender: M Patient Age:   31 years Exam Location:  [facility] Procedure:      VAS [place] LOWER EXTREMITY VENOUS (DVT) Referring Phys: Delice Bison --------------------------------------------------------------------------------  Indications: Swelling, and Edema.  Comparison Study: 03/15/19 prior Performing Technologist: Argentina Ponder RVS  Examination Guidelines: A complete evaluation includes B-mode imaging, spectral Doppler, color Doppler, and power Doppler as needed of all accessible portions of each vessel. Bilateral testing is considered an integral part of a complete examination. Limited examinations for reoccurring indications may be performed as noted. The reflux portion of the exam is performed with the patient in reverse Trendelenburg.  +---------+---------------+---------+-----------+----------+-------------------+  RIGHT     Compressibility Phasicity Spontaneity Properties Thrombus Aging       +---------+---------------+---------+-----------+----------+-------------------+  CFV       Full            Yes       Yes                                         +---------+---------------+---------+-----------+----------+-------------------+  SFJ       Full                                                                  +---------+---------------+---------+-----------+----------+-------------------+  FV Prox   Full                                                                  +---------+---------------+---------+-----------+----------+-------------------+  FV Mid    Full                                                                  +---------+---------------+---------+-----------+----------+-------------------+  FV Distal Full                                                                  +---------+---------------+---------+-----------+----------+-------------------+  PFV       Full                                                                   +---------+---------------+---------+-----------+----------+-------------------+  POP       Full            Yes       Yes                                         +---------+---------------+---------+-----------+----------+-------------------+  PTV       Full                                                                  +---------+---------------+---------+-----------+----------+-------------------+  PERO                                                       Not well visualized  +---------+---------------+---------+-----------+----------+-------------------+   +----+---------------+---------+-----------+----------+--------------+  LEFT Compressibility Phasicity Spontaneity Properties Thrombus Aging  +----+---------------+---------+-----------+----------+--------------+  CFV  Full            Yes       Yes                                    +----+---------------+---------+-----------+----------+--------------+     Summary: RIGHT: - There is no evidence of deep vein thrombosis in the lower extremity.  - No cystic structure found in the popliteal fossa.  LEFT: - No evidence of common femoral vein obstruction.  *See table(s) above for measurements and observations. Electronically signed by Heath Larkhomas Hawken on 07/12/2021 at 5:02:34 AM.    Final      Subjective: He is alert, pain in leg is better.   Discharge Exam: Vitals:   07/14/21 0508 07/14/21 0916  BP: 138/87 123/79  Pulse: 73 71  Resp: 17 18  Temp: 98.5 F (36.9 C) 98.8 F (37.1 C)  SpO2: 98% 95%     General: Pt is alert, awake, not in acute distress Cardiovascular: RRR, S1/S2 +, no rubs, no gallops Respiratory: CTA bilaterally, no wheezing, no rhonchi Abdominal: Soft, NT, ND, bowel sounds + Extremities: no edema, no cyanosis, redness improving    The results of significant diagnostics from this hospitalization (including imaging, microbiology, ancillary and laboratory) are listed below for reference.     Microbiology: Recent Results  (from the past 240 hour(s))  Resp Panel by RT-PCR (Flu A&B, Covid) Nasopharyngeal Swab     Status: None   Collection Time: 07/11/21  9:00 AM   Specimen: Nasopharyngeal Swab; Nasopharyngeal(NP) swabs in vial transport medium  Result Value Ref Range Status   SARS Coronavirus 2 by RT PCR NEGATIVE NEGATIVE Final    Comment: (NOTE) SARS-CoV-2 target nucleic acids are NOT DETECTED.  The SARS-CoV-2 RNA is generally detectable in upper respiratory specimens during the acute phase of infection. The lowest concentration of SARS-CoV-2 viral copies this assay can detect is 138 copies/mL. A negative result does not preclude SARS-Cov-2 infection and should not be used as the sole basis for treatment or other patient management decisions. A negative result may occur with  improper specimen collection/handling, submission of specimen other than nasopharyngeal  swab, presence of viral mutation(s) within the areas targeted by this assay, and inadequate number of viral copies(<138 copies/mL). A negative result must be combined with clinical observations, patient history, and epidemiological information. The expected result is Negative.  Fact Sheet for Patients:  BloggerCourse.com  Fact Sheet for Healthcare Providers:  SeriousBroker.it  This test is no t yet approved or cleared by the [place] FDA and  has been authorized for detection and/or diagnosis of SARS-CoV-2 by FDA under an Emergency Use Authorization (EUA). This EUA will remain  in effect (meaning this test can be used) for the duration of the COVID-19 declaration under Section 564(b)(1) of the Act, 21 U.S.C.section 360bbb-3(b)(1), unless the authorization is terminated  or revoked sooner.       Influenza A by PCR NEGATIVE NEGATIVE Final   Influenza B by PCR NEGATIVE NEGATIVE Final    Comment: (NOTE) The Xpert Xpress SARS-CoV-2/FLU/RSV plus assay is intended as an aid in the  diagnosis of influenza from Nasopharyngeal swab specimens and should not be used as a sole basis for treatment. Nasal washings and aspirates are unacceptable for Xpert Xpress SARS-CoV-2/FLU/RSV testing.  Fact Sheet for Patients: BloggerCourse.com  Fact Sheet for Healthcare Providers: SeriousBroker.it  This test is not yet approved or cleared by the [place] FDA and has been authorized for detection and/or diagnosis of SARS-CoV-2 by FDA under an Emergency Use Authorization (EUA). This EUA will remain in effect (meaning this test can be used) for the duration of the COVID-19 declaration under Section 564(b)(1) of the Act, 21 U.S.C. section 360bbb-3(b)(1), unless the authorization is terminated or revoked.  Performed at [facility], [address] [place], [place] [address]   Blood culture (routine x 2)     Status: None (Preliminary result)   Collection Time: 07/11/21  9:10 AM   Specimen: BLOOD LEFT ARM  Result Value Ref Range Status   Specimen Description BLOOD LEFT ARM  Final   Special Requests   Final    BOTTLES DRAWN AEROBIC AND ANAEROBIC Blood Culture adequate volume   Culture   Final    NO GROWTH 3 DAYS Performed at [facility], [address], [place], [place] [address]    Report Status PENDING  Incomplete  Blood culture (routine x 2)     Status: None (Preliminary result)   Collection Time: 07/11/21  9:20 AM   Specimen: BLOOD RIGHT ARM  Result Value Ref Range Status   Specimen Description BLOOD RIGHT ARM  Final   Special Requests   Final    BOTTLES DRAWN AEROBIC AND ANAEROBIC Blood Culture adequate volume   Culture   Final    NO GROWTH 3 DAYS Performed at [facility], [address], [place], [place] [address]    Report Status PENDING  Incomplete  MRSA Next Gen by PCR, Nasal     Status: None   Collection Time: 07/12/21 12:32 PM   Specimen: Nasal Mucosa; Nasal Swab  Result Value Ref Range  Status   MRSA by PCR Next Gen NOT DETECTED NOT DETECTED Final    Comment: (NOTE) The GeneXpert MRSA Assay (FDA approved for NASAL specimens only), is one component of a comprehensive MRSA colonization surveillance program. It is not intended to diagnose MRSA infection nor to guide or monitor treatment for MRSA infections. Test performance is not FDA approved in patients less than 74 years old. Performed at [facility], [address], [place], [place] [address]  Labs: BNP (last 3 results) No results for input(s): BNP in the last 8760 hours. Basic Metabolic Panel: Recent Labs  Lab 07/10/21 1921 07/12/21 0145 07/14/21 0225  NA 135 135 137  K 3.5 3.8 4.0  CL 99 102 104  CO2 21* 21* 23  GLUCOSE 102* 116* 89  BUN 16 15 12   CREATININE 1.36* 1.11 1.06  CALCIUM 9.1 8.7* 8.9   Liver Function Tests: No results for input(s): AST, ALT, ALKPHOS, BILITOT, PROT, ALBUMIN in the last 168 hours. No results for input(s): LIPASE, AMYLASE in the last 168 hours. No results for input(s): AMMONIA in the last 168 hours. CBC: Recent Labs  Lab 07/10/21 1921 07/12/21 0145  WBC 11.0* 7.7  NEUTROABS 9.1*  --   HGB 14.2 12.9*  HCT 42.4 37.7*  MCV 95.5 94.5  PLT 211 198   Cardiac Enzymes: No results for input(s): CKTOTAL, CKMB, CKMBINDEX, TROPONINI in the last 168 hours. BNP: Invalid input(s): POCBNP CBG: No results for input(s): GLUCAP in the last 168 hours. D-Dimer No results for input(s): DDIMER in the last 72 hours. Hgb A1c Recent Labs    07/13/21 0157  HGBA1C 5.3   Lipid Profile No results for input(s): CHOL, HDL, LDLCALC, TRIG, CHOLHDL, LDLDIRECT in the last 72 hours. Thyroid function studies No results for input(s): TSH, T4TOTAL, T3FREE, THYROIDAB in the last 72 hours.  Invalid input(s): FREET3 Anemia work up Recent Labs    07/14/21 0225  VITAMINB12 986*   Urinalysis    Component Value Date/Time   COLORURINE YELLOW 03/16/2021 1435   APPEARANCEUR CLEAR  03/16/2021 1435   LABSPEC 1.016 03/16/2021 1435   PHURINE 5.0 03/16/2021 1435   GLUCOSEU NEGATIVE 03/16/2021 1435   HGBUR NEGATIVE 03/16/2021 1435   BILIRUBINUR NEGATIVE 03/16/2021 1435   KETONESUR NEGATIVE 03/16/2021 1435   PROTEINUR NEGATIVE 03/16/2021 1435   UROBILINOGEN 0.2 07/20/2020 1248   NITRITE NEGATIVE 03/16/2021 1435   LEUKOCYTESUR NEGATIVE 03/16/2021 1435   Sepsis Labs Invalid input(s): PROCALCITONIN,  WBC,  LACTICIDVEN Microbiology Recent Results (from the past 240 hour(s))  Resp Panel by RT-PCR (Flu A&B, Covid) Nasopharyngeal Swab     Status: None   Collection Time: 07/11/21  9:00 AM   Specimen: Nasopharyngeal Swab; Nasopharyngeal(NP) swabs in vial transport medium  Result Value Ref Range Status   SARS Coronavirus 2 by RT PCR NEGATIVE NEGATIVE Final    Comment: (NOTE) SARS-CoV-2 target nucleic acids are NOT DETECTED.  The SARS-CoV-2 RNA is generally detectable in upper respiratory specimens during the acute phase of infection. The lowest concentration of SARS-CoV-2 viral copies this assay can detect is 138 copies/mL. A negative result does not preclude SARS-Cov-2 infection and should not be used as the sole basis for treatment or other patient management decisions. A negative result may occur with  improper specimen collection/handling, submission of specimen other than nasopharyngeal swab, presence of viral mutation(s) within the areas targeted by this assay, and inadequate number of viral copies(<138 copies/mL). A negative result must be combined with clinical observations, patient history, and epidemiological information. The expected result is Negative.  Fact Sheet for Patients:  07/13/21  Fact Sheet for Healthcare Providers:  BloggerCourse.com  This test is no t yet approved or cleared by the [place] FDA and  has been authorized for detection and/or diagnosis of SARS-CoV-2 by FDA under an  Emergency Use Authorization (EUA). This EUA will remain  in effect (meaning this test can be used) for the duration of the COVID-19 declaration under Section 564(b)(1) of the  Act, 21 U.S.C.section 360bbb-3(b)(1), unless the authorization is terminated  or revoked sooner.       Influenza A by PCR NEGATIVE NEGATIVE Final   Influenza B by PCR NEGATIVE NEGATIVE Final    Comment: (NOTE) The Xpert Xpress SARS-CoV-2/FLU/RSV plus assay is intended as an aid in the diagnosis of influenza from Nasopharyngeal swab specimens and should not be used as a sole basis for treatment. Nasal washings and aspirates are unacceptable for Xpert Xpress SARS-CoV-2/FLU/RSV testing.  Fact Sheet for Patients: BloggerCourse.com  Fact Sheet for Healthcare Providers: SeriousBroker.it  This test is not yet approved or cleared by the [place] FDA and has been authorized for detection and/or diagnosis of SARS-CoV-2 by FDA under an Emergency Use Authorization (EUA). This EUA will remain in effect (meaning this test can be used) for the duration of the COVID-19 declaration under Section 564(b)(1) of the Act, 21 U.S.C. section 360bbb-3(b)(1), unless the authorization is terminated or revoked.  Performed at [facility], [address] [place], [place] [address]   Blood culture (routine x 2)     Status: None (Preliminary result)   Collection Time: 07/11/21  9:10 AM   Specimen: BLOOD LEFT ARM  Result Value Ref Range Status   Specimen Description BLOOD LEFT ARM  Final   Special Requests   Final    BOTTLES DRAWN AEROBIC AND ANAEROBIC Blood Culture adequate volume   Culture   Final    NO GROWTH 3 DAYS Performed at [facility], [address], [place], [place] [address]    Report Status PENDING  Incomplete  Blood culture (routine x 2)     Status: None (Preliminary result)   Collection Time: 07/11/21  9:20 AM   Specimen: BLOOD RIGHT ARM  Result  Value Ref Range Status   Specimen Description BLOOD RIGHT ARM  Final   Special Requests   Final    BOTTLES DRAWN AEROBIC AND ANAEROBIC Blood Culture adequate volume   Culture   Final    NO GROWTH 3 DAYS Performed at [facility], [address], [place], [place] [address]    Report Status PENDING  Incomplete  MRSA Next Gen by PCR, Nasal     Status: None   Collection Time: 07/12/21 12:32 PM   Specimen: Nasal Mucosa; Nasal Swab  Result Value Ref Range Status   MRSA by PCR Next Gen NOT DETECTED NOT DETECTED Final    Comment: (NOTE) The GeneXpert MRSA Assay (FDA approved for NASAL specimens only), is one component of a comprehensive MRSA colonization surveillance program. It is not intended to diagnose MRSA infection nor to guide or monitor treatment for MRSA infections. Test performance is not FDA approved in patients less than 18 years old. Performed at [facility], [address] [place], [place] [address]      Time coordinating discharge: 40 minutes  SIGNED:   Alba Cory, MD  [facility]

## 2021-07-15 ENCOUNTER — Other Ambulatory Visit: Payer: Self-pay | Admitting: Physician Assistant

## 2021-07-15 ENCOUNTER — Other Ambulatory Visit: Payer: Self-pay

## 2021-07-15 ENCOUNTER — Other Ambulatory Visit: Payer: Self-pay | Admitting: Family Medicine

## 2021-07-15 ENCOUNTER — Telehealth: Payer: Self-pay

## 2021-07-15 DIAGNOSIS — F0391 Unspecified dementia with behavioral disturbance: Secondary | ICD-10-CM

## 2021-07-15 MED FILL — Rosuvastatin Calcium Tab 40 MG: ORAL | 30 days supply | Qty: 30 | Fill #0 | Status: CN

## 2021-07-15 NOTE — Telephone Encounter (Signed)
Discontinued 11/08/20, change in therapy.  Requested Prescriptions  Pending Prescriptions Disp Refills   memantine (NAMENDA) 5 MG tablet 60 tablet 3    Sig: TAKE 1 TABLET (5 MG TOTAL) BY MOUTH 2 (TWO) TIMES DAILY.     Neurology:  Alzheimer's Agents Passed - 07/15/2021  9:18 AM      Passed - Valid encounter within last 6 months    Recent Outpatient Visits          1 month ago Atypical chest pain   [facility] Hoy Register, MD   3 months ago Hypokalemia   [facility] [place], [place], [place]   12 months ago [employer] vision, bilateral   [facility] [place], Odette Horns, MD   1 year ago Essential hypertension   [facility] [place], Odette Horns, MD   1 year ago Vision changes   [facility] [place], Marzella Schlein, [place]      Future Appointments            In 3 months Katrinka Blazing, Barry Dienes, MD [facility], [facility]

## 2021-07-15 NOTE — Telephone Encounter (Signed)
Transition Care Management Follow-up Telephone Call   Date of discharge and from where:[facility] 07/14/2021 How have you been since you were released from the hospital? Daughter answered signed DPR authorizing all [facility] to verbally disclose PHI to [name]. Id Confirmed .Daughter stated father Better eating and feeling ok Any questions or concerns? No questions/concerns reported.  Items Reviewed: Did the pt receive and understand the discharge instructions provided? have the instructions and have no questions.  Medications obtained and verified? She said that they have the medication list  and the hospital staff reviewed them in detail prior to discharge. She said that he has all of the medications and they have no questions.  Any new allergies since your discharge? None reported  Do you have support at home? Yes, daughter and family  Other (ie: DME, Home Health, etc)     NONE   Functional Questionnaire: (I = Independent and D = Dependent) ADL's:  Independent.        Follow up appointments reviewed:   PCP Hospital f/u appt confirmed? MD Newlin on 02.28 .2023@ 1050. Daughter ok with this appt .  Specialist Hospital f/u appt confirmed? scheduled at this time  Are transportation arrangements needed? have transportation   If their condition worsens, is the pt aware to call  their PCP or go to the ED? Yes Was the patient provided with contact information for the PCP's office or ED? Daughter stated He has the phone number  Was the pt encouraged to call back with questions or concerns?yes

## 2021-07-16 ENCOUNTER — Other Ambulatory Visit: Payer: Self-pay

## 2021-07-16 LAB — CULTURE, BLOOD (ROUTINE X 2)
Culture: NO GROWTH
Culture: NO GROWTH
Special Requests: ADEQUATE
Special Requests: ADEQUATE

## 2021-07-22 ENCOUNTER — Other Ambulatory Visit: Payer: Self-pay

## 2021-07-24 ENCOUNTER — Other Ambulatory Visit: Payer: Self-pay

## 2021-07-24 ENCOUNTER — Other Ambulatory Visit: Payer: Self-pay | Admitting: Family Medicine

## 2021-07-24 NOTE — Telephone Encounter (Signed)
Requested medication (s) are due for refill today - expired Rx  Requested medication (s) are on the active medication list -yes  Future visit scheduled -yes  Last refill: 07/17/20 #90 1 RF  Notes to clinic: Request RF: expired Rx, fails lab protocol  Requested Prescriptions  Pending Prescriptions Disp Refills   rosuvastatin (CRESTOR) 40 MG tablet 90 tablet 1    Sig: TAKE 1 TABLET (40 MG TOTAL) BY MOUTH DAILY.     Cardiovascular:  Antilipid - Statins 2 Failed - 07/24/2021 10:45 AM      Failed - Lipid Panel in normal range within the last 12 months    Cholesterol, Total  Date Value Ref Range Status  10/18/2019 225 (H) 100 - 199 mg/dL Final   LDL Chol Calc (NIH)  Date Value Ref Range Status  10/18/2019 136 (H) 0 - 99 mg/dL Final   HDL  Date Value Ref Range Status  10/18/2019 62 >39 mg/dL Final   Triglycerides  Date Value Ref Range Status  10/18/2019 154 (H) 0 - 149 mg/dL Final         Passed - Cr in normal range and within 360 days    Creat  Date Value Ref Range Status  09/20/2015 0.95 0.70 - 1.25 mg/dL Final   Creatinine, Ser  Date Value Ref Range Status  07/14/2021 1.06 0.61 - 1.24 mg/dL Final          Passed - Patient is not pregnant      Passed - Valid encounter within last 12 months    Recent Outpatient Visits           2 months ago Atypical chest pain   [place], [place]ong, MD   3 months ago Hypokalemia   [place], [place], [place]   1 year ago [place] vision, bilateral   [place], Charlane Ferretti, MD   1 year ago Essential hypertension   [place], Charlane Ferretti, MD   1 year ago [place], Dionne [place], [place]       Future Appointments             In 2 weeks Charlott Rakes, MD [place]   In 2 months Belva Crome, MD [place], [facility]               Requested Prescriptions  Pending Prescriptions Disp Refills   rosuvastatin (CRESTOR) 40 MG tablet 90 tablet 1    Sig: TAKE 1 TABLET (40 MG TOTAL) BY MOUTH DAILY.     Cardiovascular:  Antilipid - Statins 2 Failed - 07/24/2021 10:45 AM      Failed - Lipid Panel in normal range within the last 12 months    Cholesterol, Total  Date Value Ref Range Status  10/18/2019 225 (H) 100 - 199 mg/dL Final   LDL Chol Calc (NIH)  Date Value Ref Range Status  10/18/2019 136 (H) 0 - 99 mg/dL Final   HDL  Date Value Ref Range Status  10/18/2019 62 >39 mg/dL Final   Triglycerides  Date Value Ref Range Status  10/18/2019 154 (H) 0 - 149 mg/dL Final         Passed - Cr in normal range and within 360 days    Creat  Date Value Ref Range Status  09/20/2015 0.95  0.70 - 1.25 mg/dL Final   Creatinine, Ser  Date Value Ref Range Status  07/14/2021 1.06 0.61 - 1.24 mg/dL Final          Passed - Patient is not pregnant      Passed - Valid encounter within last 12 months    Recent Outpatient Visits           2 months ago Atypical chest pain   [place], Enobong, MD   3 months ago Hypokalemia   [place], [place], [place]   1 year ago [place] vision, bilateral   [place], MD   1 year ago Essential hypertension   [place], Charlane Ferretti, MD   1 year ago [place], Dionne Bu[place], [place]       Future Appointments             In 2 weeks Charlott Rakes, MD [place]   In 2 months Tam[place] Julian, Lynnell Dike, MD [facility], [facility]

## 2021-07-25 ENCOUNTER — Other Ambulatory Visit: Payer: Self-pay

## 2021-07-25 MED ORDER — ROSUVASTATIN CALCIUM 40 MG PO TABS
ORAL_TABLET | Freq: Every day | ORAL | 1 refills | Status: DC
  Filled 2021-07-25: qty 30, 30d supply, fill #0

## 2021-07-30 ENCOUNTER — Emergency Department (HOSPITAL_COMMUNITY)
Admission: EM | Admit: 2021-07-30 | Discharge: 2021-07-30 | Disposition: A | Discharge status: Home / Self Care | Payer: Self-pay | Attending: Emergency Medicine | Admitting: Emergency Medicine

## 2021-07-30 ENCOUNTER — Other Ambulatory Visit: Payer: Self-pay

## 2021-07-30 ENCOUNTER — Encounter (HOSPITAL_COMMUNITY): Payer: Self-pay | Admitting: Emergency Medicine

## 2021-07-30 ENCOUNTER — Emergency Department (HOSPITAL_COMMUNITY): Payer: Self-pay

## 2021-07-30 DIAGNOSIS — M1990 Unspecified osteoarthritis, unspecified site: Secondary | ICD-10-CM

## 2021-07-30 DIAGNOSIS — M1712 Unilateral primary osteoarthritis, left knee: Secondary | ICD-10-CM | POA: Insufficient documentation

## 2021-07-30 MED ORDER — OXYCODONE-ACETAMINOPHEN 5-325 MG PO TABS
1.0000 | ORAL_TABLET | Freq: Once | ORAL | Status: AC
  Administered 2021-07-30: 1 via ORAL
  Filled 2021-07-30: qty 1

## 2021-07-30 MED ORDER — NAPROXEN 500 MG PO TABS: 500.0000 mg | ORAL_TABLET | Freq: Two times a day (BID) | ORAL | 0 refills | Status: AC | PRN

## 2021-07-30 NOTE — ED Triage Notes (Signed)
Patient arrives with son c/o left leg/ knee pain. States it feels similar to his right leg that had cellulitis last month. Pain worse when ambulating. No redness or swelling noted.

## 2021-07-30 NOTE — Discharge Instructions (Signed)
Strongly recommend following up with an orthopedic specialist as well as his primary care doctor.  For pain control recommend taking Tylenol and an anti-inflammatory such as the prescribed naproxen as needed.  If he develops worsening swelling, redness, fever or inability to bear weight, return to ER for reassessment.

## 2021-07-30 NOTE — ED Provider Triage Note (Signed)
Emergency Medicine Provider Triage Evaluation Note  Thomas Ortiz , a 66 y.o. male  was evaluated in triage.  Pt complains of left knee pain.  Started 2 days ago.  Worse on the medial side.  States it feels like when he had cellulitis previously.  Denies any fevers at home areas or trauma to the knee..  Review of Systems  Positive: Knee pain Negative: fever  Physical Exam  BP 124/84 (BP Location: Right Arm)    Pulse (!) 103    Temp 99.2 F (37.3 C) (Oral)    Resp 14    Ht 5\' 6"  (1.676 m)    Wt 52.7 kg    SpO2 98%    BMI 18.75 kg/m  Gen:   Awake, no distress   Resp:  Normal effort  MSK:   Moves extremities without difficulty  Other:  Lesion to the medial aspect of the left knee.  DP and PT are 2+.  Medical Decision Making  Medically screening exam initiated at 12:03 PM.  Appropriate orders placed.  Glenda Kunst was informed that the remainder of the evaluation will be completed by another provider, this initial triage assessment does not replace that evaluation, and the importance of remaining in the ED until their evaluation is complete.  X-ray given the swelling and tenderness   [name], PA-C 07/30/21 1204

## 2021-07-31 ENCOUNTER — Other Ambulatory Visit: Payer: Self-pay

## 2021-07-31 NOTE — ED Provider Notes (Signed)
[facility] EMERGENCY DEPARTMENT Provider Note   CSN: [id number] Arrival date & time: 07/30/21  1149     History  Chief Complaint  Patient presents with   Leg Pain    Thomas Ortiz is a 66 y.o. male.  Presented to the ER with concern for left knee pain.  Patient reports that he has had knee pain for many weeks to months.  Seems to be worsened over the last few days.  Denies any known injuries.  Is able to bear weight.  Patient has dementia and this limits history but he is able to answer basic questions.  Patient speaks some English but utilize language line interpreter to obtain more thorough history.  Additional history was obtained from chart review, review of recent discharge summary, admission for right leg cellulitis.  Additional history obtained from discussion with ex-wife over the phone who states that she helps to take care of him.  She states the only concern was the left knee today.    HPI     Home Medications Prior to Admission medications   Medication Sig Start Date End Date Taking? Authorizing Provider  naproxen (NAPROSYN) 500 MG tablet Take 1 tablet (500 mg total) by mouth 2 (two) times daily as needed. 07/30/21  Yes Milagros Loll, MD  fluticasone (FLONASE) 50 MCG/ACT nasal spray Place 2 sprays into both nostrils daily. Patient not taking: Reported on 07/11/2021 05/22/21   Hoy Register, MD  memantine (NAMENDA) 10 MG tablet Take 1 tablet (10 mg total) by mouth 2 (two) times daily. Patient not taking: Reported on 07/11/2021 11/08/20   Marcos Eke, PA-C  QUEtiapine (SEROQUEL) 25 MG tablet Take 1 tablet at night for agitation, paranoia Patient not taking: Reported on 07/11/2021 11/08/20   Marcos Eke, PA-C  rosuvastatin (CRESTOR) 40 MG tablet TAKE 1 TABLET (40 MG TOTAL) BY MOUTH DAILY. 07/25/21 07/25/22  Hoy Register, MD  sertraline (ZOLOFT) 50 MG tablet Take 1 tablet (50 mg total) by mouth daily. Patient not taking: Reported on 07/11/2021  04/10/21   Anders Simmonds, PA-C      Allergies    Patient has no known allergies.    Review of Systems   Review of Systems  Musculoskeletal:  Positive for arthralgias.  All other systems reviewed and are negative.  Physical Exam Updated Vital Signs BP 130/85 (BP Location: Right Arm)    Pulse 98    Temp 99 F (37.2 C) (Oral)    Resp 16    Ht 5\' 6"  (1.676 m)    Wt 52.7 kg    SpO2 100%    BMI 18.75 kg/m  Physical Exam Vitals and nursing note reviewed.  Constitutional:      General: He is not in acute distress.    Appearance: He is well-developed.  HENT:     Head: Normocephalic and atraumatic.  Eyes:     Conjunctiva/sclera: Conjunctivae normal.  Cardiovascular:     Rate and Rhythm: Normal rate.     Pulses: Normal pulses.  Pulmonary:     Effort: Pulmonary effort is normal. No respiratory distress.  Musculoskeletal:        General: No swelling.     Cervical back: Neck supple.     Comments: Left lower extremity: There is some tenderness over the lower anterior knee, no significant deformity or swelling appreciated, there is normal range of motion to the knee, no erythema, distal DP and PT pulses are normal, sensation distally is normal  Skin:  General: Skin is warm and dry.     Capillary Refill: Capillary refill takes less than 2 seconds.  Neurological:     General: No focal deficit present.     Mental Status: He is alert.  Psychiatric:        Mood and Affect: Mood normal.        Behavior: Behavior normal.    ED Results / Procedures / Treatments   Labs (all labs ordered are listed, but only abnormal results are displayed) Labs Reviewed - No data to display  EKG None  Radiology DG Knee Complete 4 Views Left  Result Date: 07/30/2021 CLINICAL DATA:  A 66 year old male presents with LEFT anterior knee pain, 2 days duration without history of injury. EXAM: LEFT KNEE - COMPLETE 4+ VIEW COMPARISON:  July 05, 2015. FINDINGS: Signs of chondrocalcinosis about the LEFT  medial compartment in particular. Tricompartmental osteoarthritic changes. No sign of acute fracture or dislocation. Tricompartmental degenerative changes are worse in the patellofemoral and medial compartments. No joint effusion. IMPRESSION: 1. Tricompartmental osteoarthritic changes without acute osseous abnormality. 2. Degenerative changes include chondrocalcinosis. Electronically Signed   By: Donzetta Kohut M.D.   On: 07/30/2021 12:41    Procedures Procedures    Medications Ordered in ED Medications  oxyCODONE-acetaminophen (PERCOCET/ROXICET) 5-325 MG per tablet 1 tablet (1 tablet Oral Given 07/30/21 1347)    ED Course/ Medical Decision Making/ A&P                           Medical Decision Making Risk Prescription drug management.   66 year old male presenting to ER with concern for left knee pain.  Based on history from patient as well as history from his ex-wife, this is an acute on chronic issue.  His knee appears normal currently.  There is no swelling or erythema or decreased range of motion to suggest septic joint or even gout.  Based on his exam do not feel the arthrocentesis indicated at this time.  Knee x-ray was obtained and independently reviewed by myself.  Evidence for tricompartmental osteoarthritis.  Suspect osteoarthritis likely etiology for his pain today.  Advised patient and family to have patient follow-up with orthopedics on an outpatient basis.  He was recently admitted a couple weeks ago for right leg cellulitis.  His right leg today appears to be healing well, no evidence for recurrent cellulitis on physical exam today.  Patient was given Percocet for pain control and had improvement in symptoms.  Recommend course of NSAIDs at home for now.    After the discussed management above, the patient was determined to be safe for discharge.  The patient was in agreement with this plan and all questions regarding their care were answered.  ED return precautions were  discussed and the patient will return to the ED with any significant worsening of condition.         Final Clinical Impression(s) / ED Diagnoses Final diagnoses:  Osteoarthritis, unspecified osteoarthritis type, unspecified site    Rx / DC Orders ED Discharge Orders          Ordered    naproxen (NAPROSYN) 500 MG tablet  2 times daily PRN        07/30/21 1315              Milagros Loll, MD 07/31/21 0930

## 2021-08-01 ENCOUNTER — Other Ambulatory Visit: Payer: Self-pay

## 2021-08-13 ENCOUNTER — Ambulatory Visit: Payer: Self-pay | Admitting: Family Medicine

## 2021-08-26 NOTE — Progress Notes (Unsigned)
Established Patient Office Visit  Subjective:  Patient ID: Thomas Ortiz, male    DOB: 1956/01/27  Age: 66 y.o. MRN: [id number]  CC: No chief complaint on file.   HPI Leroi Haque presents for HFU. Too far out from dc for TOC visit  PCV 20 Admit date: 07/10/2021 Discharge date: 07/14/2021   Admitted From: Home  Disposition: Home    Recommendations for Outpatient Follow-up:  Follow up with PCP in 1-2 weeks Please obtain BMP/CBC in one week     Home Health: None   Discharge Condition: Stable.  CODE STATUS: Full code Diet recommendation: Heart Healthy   Brief/Interim Summary: 66 year old past medical history significant for dementia, hypertension, hyperlipidemia presented with 3 days history of right leg pain redness and fever.   Evaluation in the ED patient had a temperature of 101, white blood cell 11.2, lactic acid 2.1.  He was a started on cefazolin for cellulitis.     1-Right Lower extremity Cellulitis: [place] Negative for DVT White blood cell normalized A1c: 5.3 Improving, discharge on keflex TID for 5 more days.    2-SIRS: Resolved Sepsis rule out   3-Dementia with occasional sundowning: Continue with Namenda Per daughter he has not been taking any mediations since December.  Ammonia 16, RPR non reactive. TSH normal last year.  B 12. normal.  Continue follow up out patient neurology    4-Hypertension: Will hold metoprolol, he was not taking it, and HR has been low side.  Monitor BP    Hyperlipidemia Continue with statins   Estimated body mass index is 19.75 kg/m as calculated from the following:   Height as of this encounter: 5' 6"  (1.676 m).   Weight as of this encounter: 55.5 kg.       Discharge Diagnoses:  Principal Problem:   Cellulitis  Past Medical History:  Diagnosis Date   Hypertension 2007    Past Surgical History:  Procedure Laterality Date   bilateral foot sx  2011   CHOLECYSTECTOMY N/A 01/30/2015   Procedure: LAPAROSCOPIC  CHOLECYSTECTOMY WITH INTRAOPERATIVE CHOLANGIOGRAM;  Surgeon: Donnie Mesa, MD;  Location: [facility];  Service: General;  Laterality: N/A;   COLONOSCOPY N/A 08/17/2015   Procedure: COLONOSCOPY;  Surgeon: Danie Binder, MD;  [facility];  Service: Endoscopy;  Laterality: N/A;  1:45 PM    No family history on file.  Social History   Socioeconomic History   Marital status: Married    Spouse name: Not on file   Number of children: Not on file   Years of education: Not on file   Highest education level: Not on file  Occupational History   Not on file  Tobacco Use   Smoking status: Former   Smokeless tobacco: Never  Vaping Use   Vaping Use: Never used  Substance and Sexual Activity   Alcohol use: No    Alcohol/week: 2.0 standard drinks    Types: 2 Cans of beer per week    Comment: occasionally   Drug use: No   Sexual activity: Not on file  Other Topics Concern   Not on file  Social History Narrative   Right handed    Lives with family    Social Determinants of Health   Financial Resource Strain: Not on file  Food Insecurity: Not on file  Transportation Needs: Not on file  Physical Activity: Not on file  Stress: Not on file  Social Connections: Not on file  Intimate Partner Violence: Not on file  Outpatient Medications Prior to Visit  Medication Sig Dispense Refill   fluticasone (FLONASE) 50 MCG/ACT nasal spray Place 2 sprays into both nostrils daily. (Patient not taking: Reported on 07/11/2021) 16 g 1   memantine (NAMENDA) 10 MG tablet Take 1 tablet (10 mg total) by mouth 2 (two) times daily. (Patient not taking: Reported on 07/11/2021) 30 tablet 11   naproxen (NAPROSYN) 500 MG tablet Take 1 tablet (500 mg total) by mouth 2 (two) times daily as needed. 20 tablet 0   QUEtiapine (SEROQUEL) 25 MG tablet Take 1 tablet at night for agitation, paranoia (Patient not taking: Reported on 07/11/2021) 30 tablet 3   rosuvastatin (CRESTOR) 40 MG tablet TAKE 1 TABLET (40 MG  TOTAL) BY MOUTH DAILY. 30 tablet 1   sertraline (ZOLOFT) 50 MG tablet Take 1 tablet (50 mg total) by mouth daily. (Patient not taking: Reported on 07/11/2021) 30 tablet 3   No facility-administered medications prior to visit.    No Known Allergies  ROS Review of Systems    Objective:    Physical Exam  There were no vitals taken for this visit. Wt Readings from Last 3 Encounters:  07/30/21 116 lb 2.9 oz (52.7 kg)  07/14/21 116 lb 2.9 oz (52.7 kg)  05/22/21 135 lb 3.2 oz (61.3 kg)     Health Maintenance Due  Topic Date Due   COVID-19 Vaccine (1) Never done   Zoster Vaccines- Shingrix (1 of 2) Never done   Pneumonia Vaccine 38+ Years old (1 - PCV) Never done    There are no preventive care reminders to display for this patient.  Lab Results  Component Value Date   TSH 1.360 07/17/2020   Lab Results  Component Value Date   WBC 7.7 07/12/2021   HGB 12.9 (L) 07/12/2021   HCT 37.7 (L) 07/12/2021   MCV 94.5 07/12/2021   PLT 198 07/12/2021   Lab Results  Component Value Date   NA 137 07/14/2021   K 4.0 07/14/2021   CO2 23 07/14/2021   GLUCOSE 89 07/14/2021   BUN 12 07/14/2021   CREATININE 1.06 07/14/2021   BILITOT 0.3 04/10/2021   ALKPHOS 74 04/10/2021   AST 13 04/10/2021   ALT 9 04/10/2021   PROT 6.1 04/10/2021   ALBUMIN 4.2 04/10/2021   CALCIUM 8.9 07/14/2021   ANIONGAP 10 07/14/2021   EGFR 67 04/10/2021   Lab Results  Component Value Date   CHOL 225 (H) 10/18/2019   Lab Results  Component Value Date   HDL 62 10/18/2019   Lab Results  Component Value Date   LDLCALC 136 (H) 10/18/2019   Lab Results  Component Value Date   TRIG 154 (H) 10/18/2019   Lab Results  Component Value Date   CHOLHDL 3.6 10/18/2019   Lab Results  Component Value Date   HGBA1C 5.3 07/13/2021      Assessment & Plan:   Problem List Items Addressed This Visit   None   No orders of the defined types were placed in this encounter.   Follow-up: No follow-ups on  file.    Asencion Noble, MD

## 2021-08-27 ENCOUNTER — Ambulatory Visit: Payer: Self-pay | Admitting: Critical Care Medicine

## 2021-10-10 IMAGING — CT CT HEAD W/O CM
4 series · 16 of 47 positions shown, 18 images · non-contrast
Comparison: None.

CLINICAL DATA: Dimension dizziness

EXAM:
CT HEAD WITHOUT CONTRAST
TECHNIQUE: Contiguous axial images were obtained from the base of the skull
through the vertex without intravenous contrast.

[Series 2: head wo · axial · 0.40mm/px · z∈[-101,+14]mm · 7 of 31 slices shown, 9 images]
[im 4/31  brain]
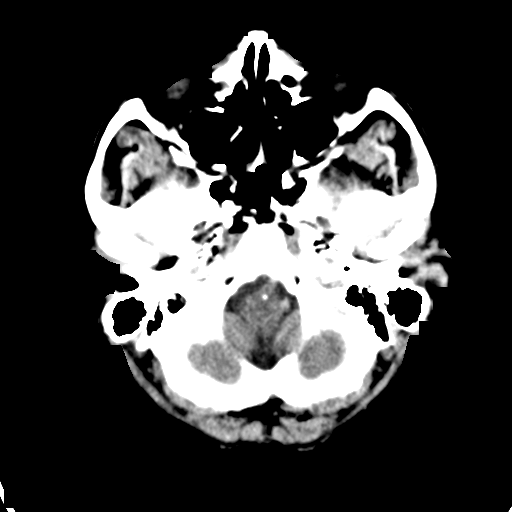
[im 4/31  bone]
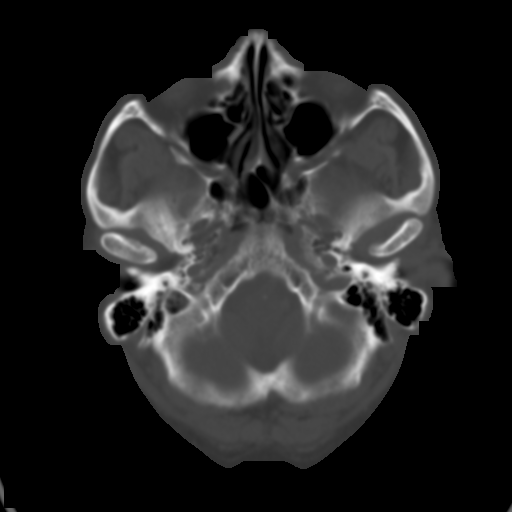
[im 8/31  brain]
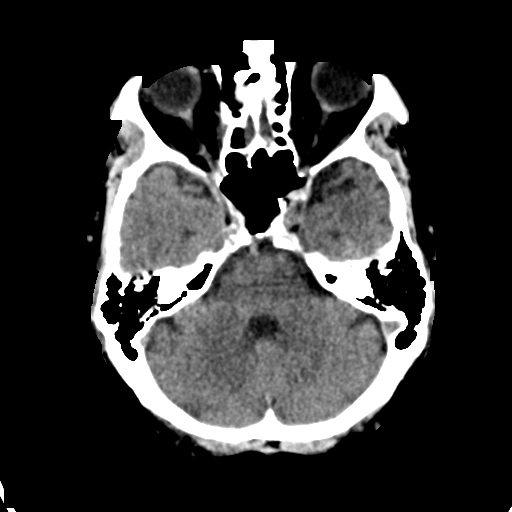
[im 12/31  brain]
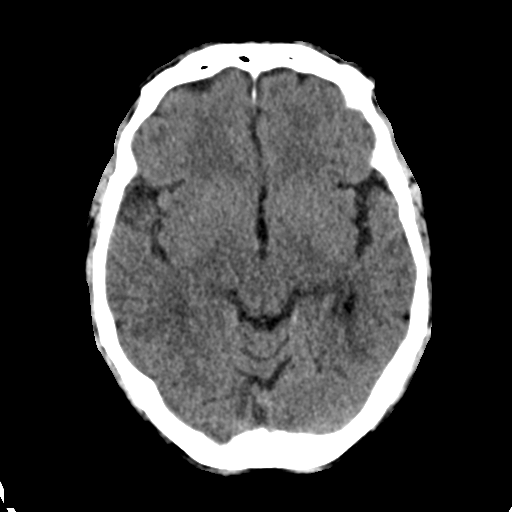
[im 16/31  brain]
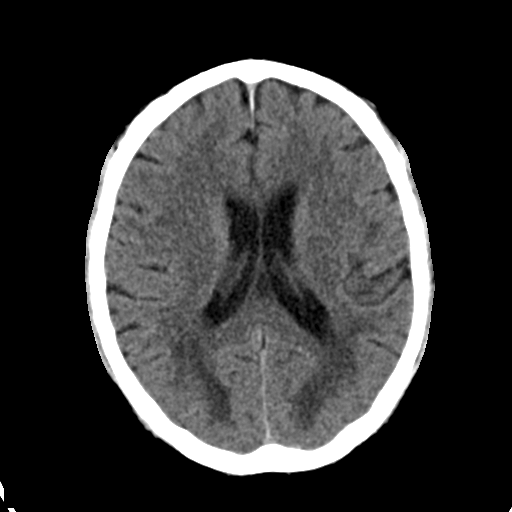
[im 19/31  brain]
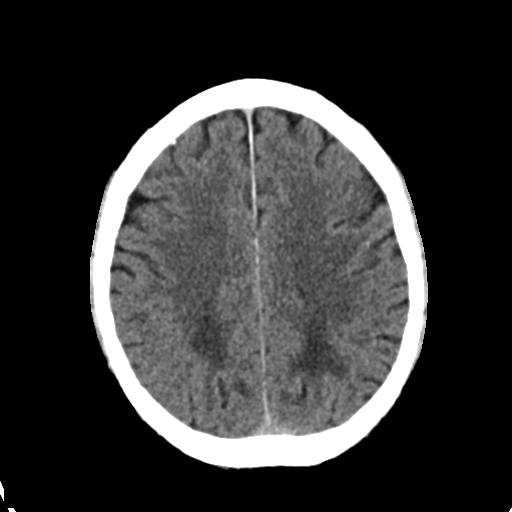
[im 19/31  bone]
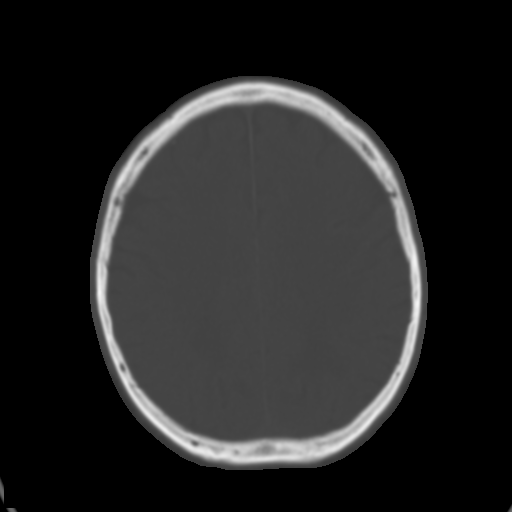
[im 23/31  brain]
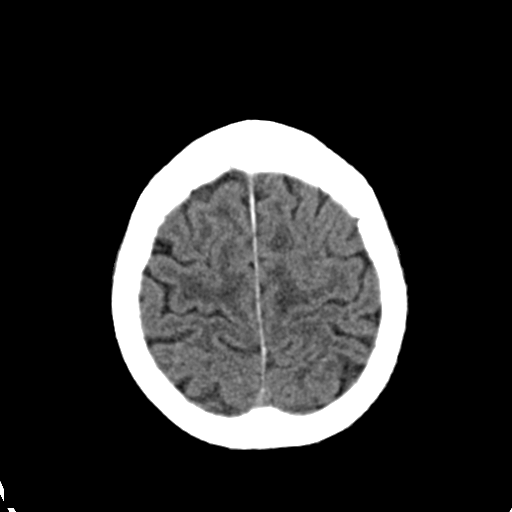
[im 27/31  brain]
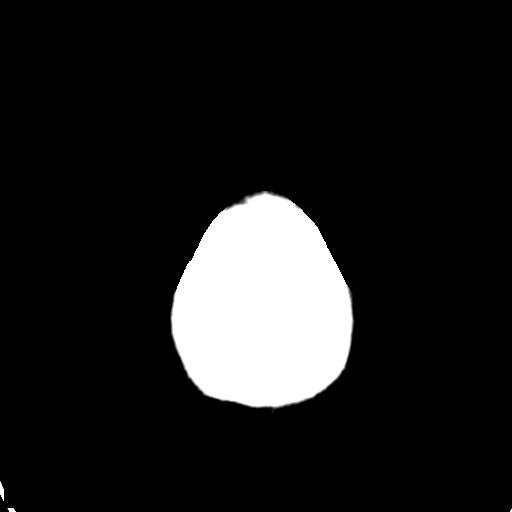

[Series 3: head bone · axial · 0.40mm/px · z∈[-102,-72]mm · 3 of 77 slices shown]
[im 8/77  bone]
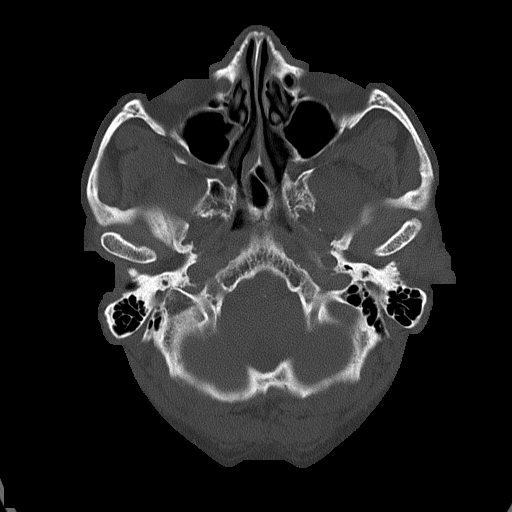
[im 16/77  bone]
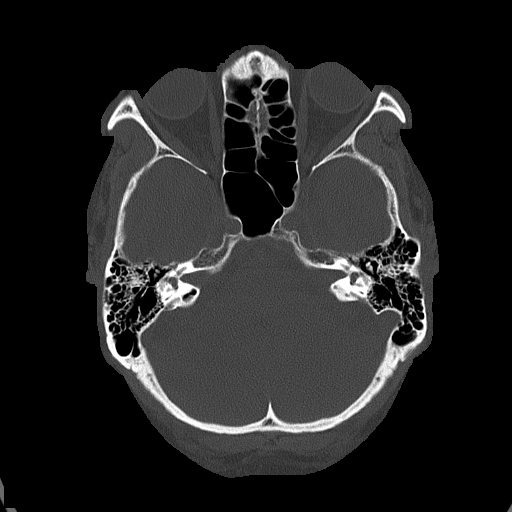
[im 23/77  bone]
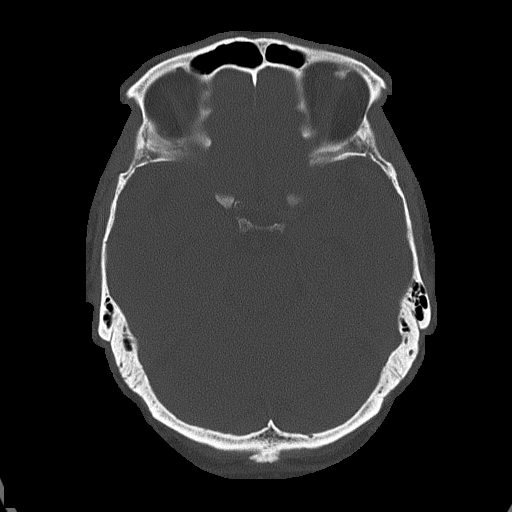

[Series 4: coronal soft tissue · coronal · 0.28mm/px · 3 of 67 slices shown]
[im 23/67  brain]
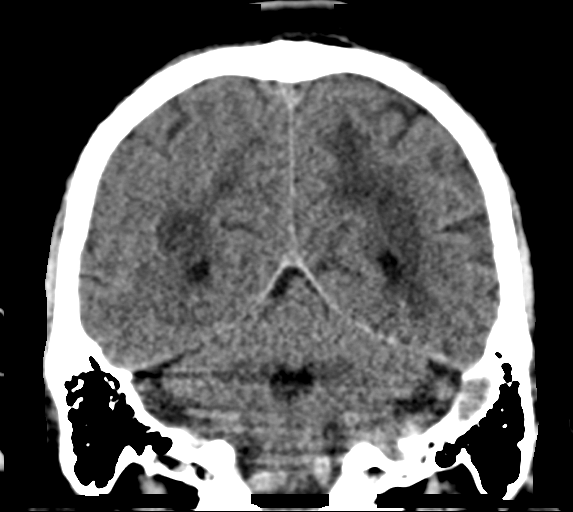
[im 30/67  brain]
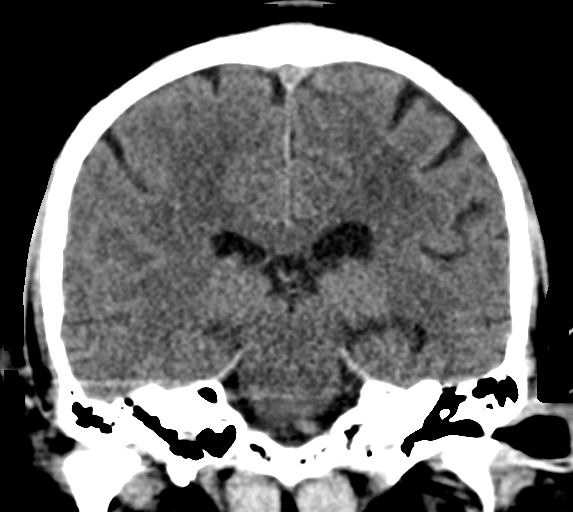
[im 37/67  brain]
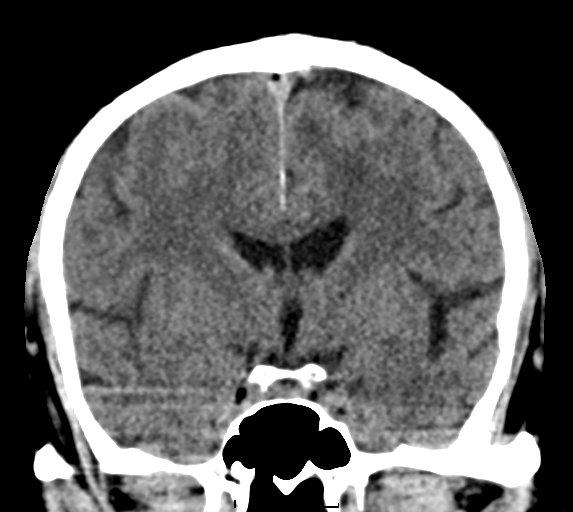

[Series 5: sagittal soft tissue · sagittal · 0.28mm/px · 3 of 54 slices shown]
[im 18/54  brain]
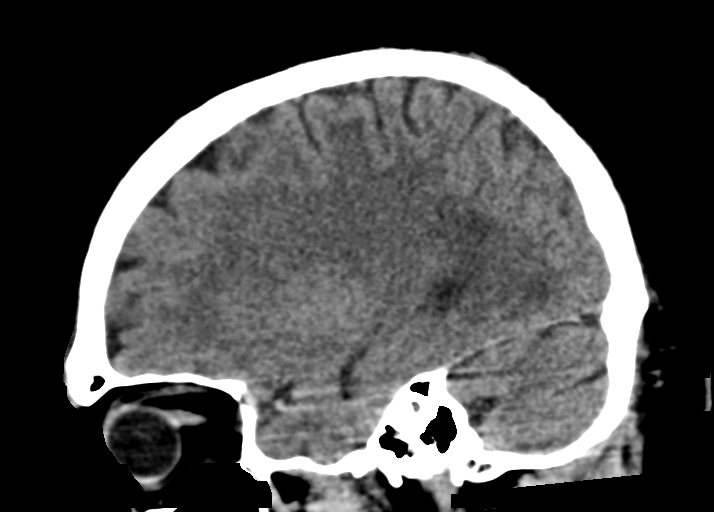
[im 27/54  brain]
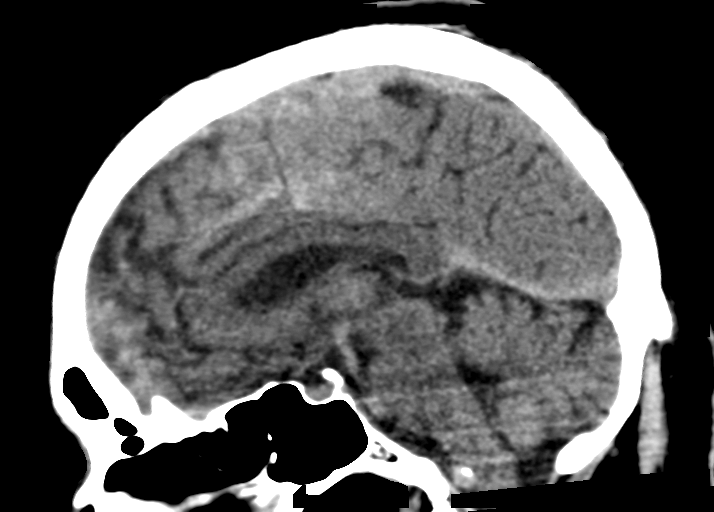
[im 36/54  brain]
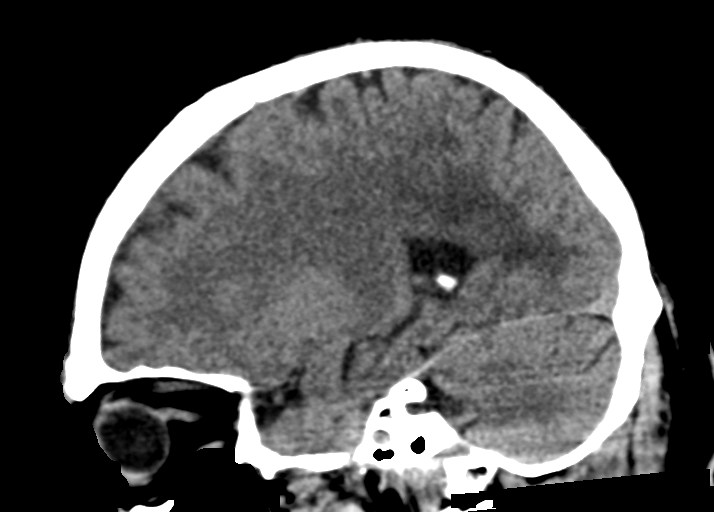

[16 of 47 positions shown; findings below may reference images not displayed]

FINDINGS: Brain: No evidence of acute infarction, hemorrhage, hydrocephalus,
extra-axial collection or mass lesion/mass effect. White matter
hypodensities greatest in the occipital and posterior parietal
lobes. Brain volume is normal for age.

Vascular: No hyperdense vessel or unexpected calcification.

Skull: Normal. Negative for fracture or focal lesion.

Sinuses/Orbits: No acute finding.

Other: None.
IMPRESSION: 1. No acute intracranial abnormality.
2. White matter hypodensities greatest in the occipital and
posterior parietal lobes, most commonly small vessel ischemia. Given
the posterior predominance, however, MRI may be helpful for further
characterization.

## 2021-10-17 NOTE — Progress Notes (Deleted)
Cardiology Office Note:    Date:  10/17/2021   ID:  Thomas Ortiz, DOB [birth year], MRN [id number]  PCP:  Hoy Register, MD  Cardiologist:  Lesleigh Noe, MD   Referring MD: Hoy Register, MD   No chief complaint on file.   History of Present Illness:    Thomas Ortiz is a 66 y.o. male with a hx of coronary calcium on CT scan with negative nuclear stress testing in 2019, hyperlipidemia, primary hypertension, and recurrent chest pain.  Recent ER visits for osteoarthritis 02/2022, and suicide attempt 11/2019.  ***  Past Medical History:  Diagnosis Date   Hypertension 2007    Past Surgical History:  Procedure Laterality Date   bilateral foot sx  2011   CHOLECYSTECTOMY N/A 01/30/2015   Procedure: LAPAROSCOPIC CHOLECYSTECTOMY WITH INTRAOPERATIVE CHOLANGIOGRAM;  Surgeon: Manus Rudd, MD;  [facility];  Service: General;  Laterality: N/A;   COLONOSCOPY N/A 08/17/2015   Procedure: COLONOSCOPY;  Surgeon: [place] Bali, MD;  [facility];  Service: Endoscopy;  Laterality: N/A;  1:45 PM    Current Medications: No outpatient medications have been marked as taking for the 10/18/21 encounter (Appointment) with Lyn Records, MD.     Allergies:   Patient has no known allergies.   Social History   Socioeconomic History   Marital status: Married    Spouse name: Not on file   Number of children: Not on file   Years of education: Not on file   Highest education level: Not on file  Occupational History   Not on file  Tobacco Use   Smoking status: Former   Smokeless tobacco: Never  Vaping Use   Vaping Use: Never used  Substance and Sexual Activity   Alcohol use: No    Alcohol/week: 2.0 standard drinks    Types: 2 Cans of beer per week    Comment: occasionally   Drug use: No   Sexual activity: Not on file  Other Topics Concern   Not on file  Social History Narrative   Right handed    Lives with family    Social Determinants of Health   Financial  Resource Strain: Not on file  Food Insecurity: Not on file  Transportation Needs: Not on file  Physical Activity: Not on file  Stress: Not on file  Social Connections: Not on file     Family History: The patient's family history is not on file.  ROS:   Please see the history of present illness.    *** All other systems reviewed and are negative.  EKGs/Labs/Other Studies Reviewed:    The following studies were reviewed today:  CORONARY CALCIUM SCORE 2019: IMPRESSION: Coronary calcium score of 242. This was 35 percentile for age and sex matched control.  CAROTID DOPPLER 2021: Summary:  Right Carotid: Velocities in the right ICA are consistent with a 1-39%  stenosis.   Left Carotid: Velocities in the left ICA are consistent with a 1-39%  stenosis.   Vertebrals:  Bilateral vertebral arteries demonstrate antegrade flow.  Subclavians: Normal flow hemodynamics were seen in bilateral subclavian               arteries.   *See table(s) above for measurements and observations.   14 DAY Monitor 2021: Study Highlights  Basic rhythm is NSR Rare PAC's and PVC's Non stained SVT occurred twice with longest run 8 beats. Episodes were asymptomatic  EKG:  EKG ***  Recent Labs: 04/10/2021: ALT 9 07/12/2021: Hemoglobin 12.9;  Platelets 198 07/14/2021: BUN 12; Creatinine, Ser 1.06; Potassium 4.0; Sodium 137  Recent Lipid Panel    Component Value Date/Time   CHOL 225 (H) 10/18/2019 1512   TRIG 154 (H) 10/18/2019 1512   HDL 62 10/18/2019 1512   CHOLHDL 3.6 10/18/2019 1512   LDLCALC 136 (H) 10/18/2019 1512    Physical Exam:    VS:  There were no vitals taken for this visit.    Wt Readings from Last 3 Encounters:  07/30/21 116 lb 2.9 oz (52.7 kg)  07/14/21 116 lb 2.9 oz (52.7 kg)  05/22/21 135 lb 3.2 oz (61.3 kg)     GEN: ***. No acute distress HEENT: Normal NECK: No JVD. LYMPHATICS: No lymphadenopathy CARDIAC: *** murmur. RRR *** gallop, or edema. VASCULAR: *** Normal  Pulses. No bruits. RESPIRATORY:  Clear to auscultation without rales, wheezing or rhonchi  ABDOMEN: Soft, non-tender, non-distended, No pulsatile mass, MUSCULOSKELETAL: No deformity  SKIN: Warm and dry NEUROLOGIC:  Alert and oriented x 3 PSYCHIATRIC:  Normal affect   ASSESSMENT:    1. Angina pectoris (HCC)   2. Coronary artery calcification seen on CT scan   3. Essential hypertension   4. Hyperlipidemia, unspecified hyperlipidemia type    PLAN:    In order of problems listed above:  ***   Medication Adjustments/Labs and Tests Ordered: Current medicines are reviewed at length with the patient today.  Concerns regarding medicines are outlined above.  No orders of the defined types were placed in this encounter.  No orders of the defined types were placed in this encounter.   There are no Patient Instructions on file for this visit.   Signed, Lesleigh Noe, MD  10/17/2021 4:37 PM    [facility]

## 2021-10-18 ENCOUNTER — Ambulatory Visit: Payer: Self-pay | Admitting: Interventional Cardiology

## 2021-10-18 DIAGNOSIS — I10 Essential (primary) hypertension: Secondary | ICD-10-CM

## 2021-10-18 DIAGNOSIS — E785 Hyperlipidemia, unspecified: Secondary | ICD-10-CM

## 2021-10-18 DIAGNOSIS — I209 Angina pectoris, unspecified: Secondary | ICD-10-CM

## 2021-10-18 DIAGNOSIS — I2510 Atherosclerotic heart disease of native coronary artery without angina pectoris: Secondary | ICD-10-CM

## 2022-06-26 ENCOUNTER — Encounter (HOSPITAL_COMMUNITY): Payer: Self-pay

## 2022-06-26 ENCOUNTER — Emergency Department (HOSPITAL_COMMUNITY)
Admission: EM | Admit: 2022-06-26 | Discharge: 2022-06-26 | Disposition: A | Discharge status: Home / Self Care | Payer: Self-pay | Attending: Emergency Medicine | Admitting: Emergency Medicine

## 2022-06-26 ENCOUNTER — Emergency Department (HOSPITAL_COMMUNITY): Payer: Self-pay

## 2022-06-26 ENCOUNTER — Other Ambulatory Visit: Payer: Self-pay

## 2022-06-26 DIAGNOSIS — F0390 Unspecified dementia without behavioral disturbance: Secondary | ICD-10-CM | POA: Insufficient documentation

## 2022-06-26 DIAGNOSIS — U071 COVID-19: Secondary | ICD-10-CM | POA: Insufficient documentation

## 2022-06-26 DIAGNOSIS — M5441 Lumbago with sciatica, right side: Secondary | ICD-10-CM | POA: Insufficient documentation

## 2022-06-26 DIAGNOSIS — M5442 Lumbago with sciatica, left side: Secondary | ICD-10-CM | POA: Insufficient documentation

## 2022-06-26 DIAGNOSIS — R1030 Lower abdominal pain, unspecified: Secondary | ICD-10-CM | POA: Insufficient documentation

## 2022-06-26 LAB — CBC WITH DIFFERENTIAL/PLATELET
Abs Immature Granulocytes: 0.02 10*3/uL (ref 0.00–0.07)
Basophils Absolute: 0 10*3/uL (ref 0.0–0.1)
Basophils Relative: 0 %
Eosinophils Absolute: 0 10*3/uL (ref 0.0–0.5)
Eosinophils Relative: 0 %
HCT: 43.6 % (ref 39.0–52.0)
Hemoglobin: 14.6 g/dL (ref 13.0–17.0)
Immature Granulocytes: 0 %
Lymphocytes Relative: 14 %
Lymphs Abs: 1 10*3/uL (ref 0.7–4.0)
MCH: 31.7 pg (ref 26.0–34.0)
MCHC: 33.5 g/dL (ref 30.0–36.0)
MCV: 94.8 fL (ref 80.0–100.0)
Monocytes Absolute: 0.5 10*3/uL (ref 0.1–1.0)
Monocytes Relative: 8 %
Neutro Abs: 5.5 10*3/uL (ref 1.7–7.7)
Neutrophils Relative %: 78 %
Platelets: 214 10*3/uL (ref 150–400)
RBC: 4.6 MIL/uL (ref 4.22–5.81)
RDW: 13.7 % (ref 11.5–15.5)
WBC: 7.1 10*3/uL (ref 4.0–10.5)
nRBC: 0 % (ref 0.0–0.2)

## 2022-06-26 LAB — I-STAT CHEM 8, ED
BUN: 18 mg/dL (ref 8–23)
Calcium, Ion: 1.15 mmol/L (ref 1.15–1.40)
Chloride: 104 mmol/L (ref 98–111)
Creatinine, Ser: 1.1 mg/dL (ref 0.61–1.24)
Glucose, Bld: 142 mg/dL — ABNORMAL HIGH (ref 70–99)
HCT: 44 % (ref 39.0–52.0)
Hemoglobin: 15 g/dL (ref 13.0–17.0)
Potassium: 3.8 mmol/L (ref 3.5–5.1)
Sodium: 140 mmol/L (ref 135–145)
TCO2: 24 mmol/L (ref 22–32)

## 2022-06-26 LAB — URINALYSIS, ROUTINE W REFLEX MICROSCOPIC
Bacteria, UA: NONE SEEN
Bilirubin Urine: NEGATIVE
Glucose, UA: NEGATIVE mg/dL
Ketones, ur: NEGATIVE mg/dL
Leukocytes,Ua: NEGATIVE
Nitrite: NEGATIVE
Protein, ur: 100 mg/dL — AB
Specific Gravity, Urine: >1.046 — ABNORMAL HIGH (ref 1.005–1.030)
pH: 5 (ref 5.0–8.0)

## 2022-06-26 LAB — COMPREHENSIVE METABOLIC PANEL
ALT: 21 U/L (ref 0–44)
AST: 31 U/L (ref 15–41)
Albumin: 3.8 g/dL (ref 3.5–5.0)
Alkaline Phosphatase: 73 U/L (ref 38–126)
Anion gap: 11 (ref 5–15)
BUN: 15 mg/dL (ref 8–23)
CO2: 22 mmol/L (ref 22–32)
Calcium: 8.8 mg/dL — ABNORMAL LOW (ref 8.9–10.3)
Chloride: 104 mmol/L (ref 98–111)
Creatinine, Ser: 1.13 mg/dL (ref 0.61–1.24)
GFR, Estimated: >60 mL/min (ref >60)
Glucose, Bld: 141 mg/dL — ABNORMAL HIGH (ref 70–99)
Potassium: 3.7 mmol/L (ref 3.5–5.1)
Sodium: 137 mmol/L (ref 135–145)
Total Bilirubin: 0.4 mg/dL (ref 0.3–1.2)
Total Protein: 8 g/dL (ref 6.5–8.1)

## 2022-06-26 LAB — RESP PANEL BY RT-PCR (RSV, FLU A&B, COVID)  RVPGX2
Influenza A by PCR: NEGATIVE
Influenza B by PCR: NEGATIVE
Resp Syncytial Virus by PCR: NEGATIVE
SARS Coronavirus 2 by RT PCR: POSITIVE — AB

## 2022-06-26 LAB — RAPID URINE DRUG SCREEN, HOSP PERFORMED
Amphetamines: NOT DETECTED
Barbiturates: NOT DETECTED
Benzodiazepines: NOT DETECTED
Cocaine: NOT DETECTED
Opiates: NOT DETECTED
Tetrahydrocannabinol: NOT DETECTED

## 2022-06-26 LAB — ETHANOL: Alcohol, Ethyl (B): <10 mg/dL (ref <10)

## 2022-06-26 LAB — LIPASE, BLOOD: Lipase: 47 U/L (ref 11–51)

## 2022-06-26 MED ORDER — GABAPENTIN 100 MG PO CAPS: 100.0000 mg | ORAL_CAPSULE | Freq: Three times a day (TID) | ORAL | 0 refills | Status: AC | PRN

## 2022-06-26 MED ORDER — IOHEXOL 350 MG/ML SOLN
75.0000 mL | Freq: Once | INTRAVENOUS | Status: AC | PRN
  Administered 2022-06-26: 75 mL via INTRAVENOUS

## 2022-06-26 NOTE — ED Provider Notes (Signed)
[facility] EMERGENCY DEPARTMENT Provider Note   CSN: [id number] Arrival date & time: 06/26/22  1137     History  Chief Complaint  Patient presents with   Hip Pain    Thomas Ortiz is a 67 y.o. male presenting from home with concern for bilateral leg pain and back pain.  History is provided by the patient's family as the patient has dementia and is level 5 caveat, cannot provide reliable history.  His family reports the patient generally does not complain of pain but has been complaining of pain in his lower back for the past 2 to 3 days.  He says the pain is shooting down the back of his legs.  He seems to be worse with walking.  They report he has had some congestion and feeling unwell for about 2 to 3 days.  She has never gone to a spine clinic and does not typically complain of back pain.  HPI     Home Medications Prior to Admission medications   Medication Sig Start Date End Date Taking? Authorizing Provider  gabapentin (NEURONTIN) 100 MG capsule Take 1 capsule (100 mg total) by mouth 3 (three) times daily as needed for up to 21 doses. 06/26/22  Yes Wyvonnia Dusky, MD  fluticasone (FLONASE) 50 MCG/ACT nasal spray Place 2 sprays into both nostrils daily. Patient not taking: Reported on 07/11/2021 05/22/21   Charlott Rakes, MD  memantine (NAMENDA) 10 MG tablet Take 1 tablet (10 mg total) by mouth 2 (two) times daily. Patient not taking: Reported on 07/11/2021 11/08/20   Rondel Jumbo, PA-C  naproxen (NAPROSYN) 500 MG tablet Take 1 tablet (500 mg total) by mouth 2 (two) times daily as needed. Patient not taking: Reported on 06/26/2022 07/30/21   Lucrezia Starch, MD  QUEtiapine (SEROQUEL) 25 MG tablet Take 1 tablet at night for agitation, paranoia Patient not taking: Reported on 07/11/2021 11/08/20   Rondel Jumbo, PA-C  rosuvastatin (CRESTOR) 40 MG tablet TAKE 1 TABLET (40 MG TOTAL) BY MOUTH DAILY. Patient not taking: Reported on 06/26/2022 07/25/21 07/25/22  Charlott Rakes, MD  sertraline (ZOLOFT) 50 MG tablet Take 1 tablet (50 mg total) by mouth daily. Patient not taking: Reported on 07/11/2021 04/10/21   Argentina Donovan, PA-C      Allergies    Patient has no known allergies.    Review of Systems   Review of Systems  Physical Exam Updated Vital Signs BP (!) 144/98   Pulse 68   Temp 98.8 F (37.1 C) (Oral)   Resp 16   SpO2 97%  Physical Exam Constitutional:      General: He is not in acute distress. HENT:     Head: Normocephalic and atraumatic.  Eyes:     Conjunctiva/sclera: Conjunctivae normal.     Pupils: Pupils are equal, round, and reactive to light.  Cardiovascular:     Rate and Rhythm: Normal rate and regular rhythm.  Pulmonary:     Effort: Pulmonary effort is normal. No respiratory distress.  Abdominal:     General: There is no distension.     Tenderness: There is no abdominal tenderness.  Skin:    General: Skin is warm and dry.  Neurological:     General: No focal deficit present.     Mental Status: He is alert. Mental status is at baseline.     Comments: Ambulating steadily in the room     ED Results / Procedures / Treatments   Labs (  all labs ordered are listed, but only abnormal results are displayed) Labs Reviewed  RESP PANEL BY RT-PCR (RSV, FLU A&B, COVID)  RVPGX2 - Abnormal; Notable for the following components:      Result Value   SARS Coronavirus 2 by RT PCR POSITIVE (*)    All other components within normal limits  COMPREHENSIVE METABOLIC PANEL - Abnormal; Notable for the following components:   Glucose, Bld 141 (*)    Calcium 8.8 (*)    All other components within normal limits  URINALYSIS, ROUTINE W REFLEX MICROSCOPIC - Abnormal; Notable for the following components:   Specific Gravity, Urine >1.046 (*)    Hgb urine dipstick SMALL (*)    Protein, ur 100 (*)    All other components within normal limits  I-STAT CHEM 8, ED - Abnormal; Notable for the following components:   Glucose, Bld 142 (*)     All other components within normal limits  CBC WITH DIFFERENTIAL/PLATELET  LIPASE, BLOOD  RAPID URINE DRUG SCREEN, HOSP PERFORMED  ETHANOL    EKG None  Radiology CT L-SPINE NO CHARGE  Result Date: 06/26/2022 CLINICAL DATA:  Bilateral leg pain.  Began 3 days ago. EXAM: CT LUMBAR SPINE WITHOUT CONTRAST TECHNIQUE: Multidetector CT imaging of the lumbar spine was performed without intravenous contrast administration. Multiplanar CT image reconstructions were also generated. RADIATION DOSE REDUCTION: This exam was performed according to the departmental dose-optimization program which includes automated exposure control, adjustment of the mA and/or kV according to patient size and/or use of iterative reconstruction technique. COMPARISON:  None Available. FINDINGS: PLEASE SEE SEPARATE DICTATION OF ABDOMEN AND PELVIS CT.: FINDINGS: PLEASE SEE SEPARATE DICTATION OF ABDOMEN AND PELVIS CT. Segmentation: 5 lumbar type vertebrae. Alignment: Trace retrolisthesis noted at L3-4, L2-3 and L1-2. slight levoconvex curvature of the spine centered at L3. Vertebrae: Preserved vertebral body height. Slight wedge deformity of L1 is likely chronic or congenital. In principle, age-indeterminate. Sclerotic margins. Osteopenia identified. Diffuse endplate osteophytes are identified throughout the lumbar spine. These are bridging at lower thoracic region including T11-12 and near bridging at T12-L1. Paraspinal and other soft tissues: Please see separate dictation of the abdomen and pelvis CT. No obvious paraspinal fluid collection or mass. No soft tissue gas. Disc levels: T12-L1: No osseous significant central canal or neural foraminal stenosis. L1-2: Mild disc height loss with vacuum disc changes. Posterior endplate osteophytes and disc bulging with flattening of the ventral aspect of the thecal sac. Slight lateral recess stenosis and neural foraminal encroachment. L2-3: Trace retrolisthesis. Facet degenerative changes are  identified. Posterior endplate osteophytes are seen. Vacuum disc changes with some calcification along the disc. Moderate global central canal stenosis. More prominent lateral recess stenosis. Mild neural foraminal stenosis. L3-4: Mild facet degenerative changes with ligamentum flavum hypertrophy. This combines with a broad-based posterior disc bulge and osteophyte formation causes moderate to severe central canal stenosis. Significant lateral recess narrowing. There is also some right-sided neural foraminal encroachment. L4-5: Facet degenerative changes identified with ligamentum flavum hypertrophy and broad-based posterior disc bulge. There is severe canal narrowing globally with significant lateral recess stenosis mild encroachment of the inferior aspect of the neural foramen. L5-S1: Moderate facet degenerative changes. Posterior endplate osteophytes and disc bulging. Mild canal narrowing globally. More prominent lateral recess stenosis and moderate neural foraminal narrowing. IMPRESSION: Moderate overall degenerative changes identified with multilevel significant central canal and some lateral recess and neural foraminal narrowing. Please see above dictation for details of each level. If there is further concern of the exact level  of stenosis, myelogram or MRI can be performed when clinically appropriate Electronically Signed   By: Jill Side M.D.   On: 06/26/2022 14:11   CT Abdomen Pelvis W Contrast  Result Date: 06/26/2022 CLINICAL DATA:  Abdominal pain EXAM: CT ABDOMEN AND PELVIS WITH CONTRAST TECHNIQUE: Multidetector CT imaging of the abdomen and pelvis was performed using the standard protocol following bolus administration of intravenous contrast. RADIATION DOSE REDUCTION: This exam was performed according to the departmental dose-optimization program which includes automated exposure control, adjustment of the mA and/or kV according to patient size and/or use of iterative reconstruction technique.  CONTRAST:  3mL OMNIPAQUE IOHEXOL 350 MG/ML SOLN FINDINGS: Lower chest: Significant breathing motion at the lung bases and throughout the examination. There are areas of parenchymal opacities along the lingula and middle lobe with some reticular changes and bronchiectasis. Acute process is possible. Recommend follow-up. No pleural effusion. Hepatobiliary: Preserved hepatic parenchyma. Previous cholecystectomy. No ductal dilatation. Patent portal vein. Pancreas: Preserved pancreatic parenchyma. Spleen: The spleen is unremarkable.  Nonenlarged. Adrenals/Urinary Tract: Adrenal glands are preserved. Mild bilateral renal atrophy with lobular contours. No collecting system dilatation or obvious enhancing mass. Contracted urinary bladder with some wall thickening. There is mass effect from an enlarged prostate along the base of the bladder. Please correlate for any history of BPH and correlate with the patient's PSA. Stomach/Bowel: On this non oral contrast exam, large bowel has a normal course and caliber. Diffuse colonic stool. The appendix is poorly seen in the right lower quadrant with the motion but no pericecal stranding or fluid. There is moderate debris in the stomach. Small bowel is nondilated. Vascular/Lymphatic: Normal caliber aorta and IVC with some atherosclerotic changes. No specific abnormal lymph node enlargement present in the abdomen and pelvis. Reproductive: Please see above for the large prostate. Other: Small fat containing bilateral inguinal hernias. No significant ascites. No obvious free air. Musculoskeletal: Diffuse degenerative changes along the spine with multilevel stenosis. Please see separate dictation of lumbar spine CT scan from same day for details. IMPRESSION: Study significantly limited by motion throughout the examination. No bowel obstruction, free air or free fluid.  Scattered stool. Enlarged prostate with mass effect along the base of the bladder. Please correlate BP exchanges and the  patient's PSA. Middle lobe and lingular parenchymal opacities with interstitial compliance and bronchiectasis. Acute process is possible. Please correlate with clinical history and dedicated workup when appropriate Electronically Signed   By: Jill Side M.D.   On: 06/26/2022 13:28    Procedures Procedures    Medications Ordered in ED Medications  iohexol (OMNIPAQUE) 350 MG/ML injection 75 mL (75 mLs Intravenous Contrast Given 06/26/22 1320)    ED Course/ Medical Decision Making/ A&P Clinical Course as of 06/26/22 2101  Thu Jun 26, 2022  1648 SARS Coronavirus 2 by RT PCR(!): POSITIVE [MT]  1654 Discussed the patient's COVID-positive results with his daughter at the bedside.  I think he is stable for discharge home at this time.  Family to help take care of them.  Okay for discharge [MT]    Clinical Course User Index [MT] Latrelle Bazar, Carola Rhine, MD                           Medical Decision Making Amount and/or Complexity of Data Reviewed Labs:  Decision-making details documented in ED Course.  Risk Prescription drug management.   This patient presents to the ED with concern for low back pain. This involves an extensive  number of treatment options, and is a complaint that carries with it a high risk of complications and morbidity.  The differential diagnosis includes sciatica versus radiculopathy versus other   Additional history obtained from patient's daughter by phone   I ordered and personally interpreted labs.  The pertinent results include: No emergent findings on blood work.  COVID POSITIVE  CT abdomen and lumbar spine was ordered from triage after medical screening I independently visualized and interpreted imaging which showed degenerative disc disease of the lumbar spine with some central canal stenosis, foraminal stenosis I agree with the radiologist interpretation  Test Considered: Doubt AAA, do not feel that the patient is requiring an angiogram at this time.  I  doubt DVT.  Doubt intra-abdominal infection or inflammation  After the interventions noted above, I reevaluated the patient and found that they have: stayed the same   Dispostion:  After consideration of the diagnostic results and the patients response to treatment, I feel that the patent would benefit from outpatient follow-up.  Patient is no indication for antiviral therapy at this time.  He is not hypoxic and not requiring hospitalization from a respiratory perspective.  No indication for antibiotics.  He likely has a viral infection, which may be exacerbating his sciatica or lower back pain.  His family did request a spine clinic follow-up information, which is provided.  Thomas Ortiz was evaluated in Emergency Department on 06/26/2022 for the symptoms described in the history of present illness. He was evaluated in the context of the global COVID-19 pandemic, which necessitated consideration that the patient might be at risk for infection with the SARS-CoV-2 virus that causes COVID-19. Institutional protocols and algorithms that pertain to the evaluation of patients at risk for COVID-19 are in a state of rapid change based on information released by regulatory bodies including the CDC and federal and state organizations. These policies and algorithms were followed during the patient's care in the ED. [name]         Final Clinical Impression(s) / ED Diagnoses Final diagnoses:  Bilateral low back pain with bilateral sciatica, unspecified chronicity  COVID-19    Rx / DC Orders ED Discharge Orders          Ordered    gabapentin (NEURONTIN) 100 MG capsule  3 times daily PRN        06/26/22 1648              Terald Sleeper, MD 06/26/22 2101

## 2022-06-26 NOTE — ED Provider Triage Note (Signed)
Emergency Medicine Provider Triage Evaluation Note  Kolsen Choe , a 67 y.o. male  was evaluated in triage.  Pt complains of bilateral leg pain and bilateral lumbar pain which began approximately 3 days ago.  Patient also complains of some lower mid abdominal pain which began around the same time.  He denies any injuries or trauma that he knows of.  Denies nausea, vomiting, chest pain, shortness of breath. Spanish interpreter used throughout encounter  Review of Systems  Positive: As above Negative: As above  Physical Exam  BP 135/88 (BP Location: Right Arm)   Pulse 90   Temp 99.7 F (37.6 C) (Oral)   Resp 16   SpO2 100%  Gen:   Awake, no distress   Resp:  Normal effort  MSK:   Moves extremities without difficulty  Other:  Lower abdomen tender to palpation  Medical Decision Making  Medically screening exam initiated at 11:59 AM.  Appropriate orders placed.  Jovaughn Wojtaszek was informed that the remainder of the evaluation will be completed by another provider, this initial triage assessment does not replace that evaluation, and the importance of remaining in the ED until their evaluation is complete.     Dorothyann Peng, PA-C 06/26/22 1200

## 2022-06-26 NOTE — Discharge Instructions (Addendum)
Your father was diagnosed with COVID in the ER today.  Most patients are contagious with COVID for 7 to 10 days.  He should quarantine at home for up to 10 days from the start of his symptoms.  You can use over-the-counter Tylenol and ibuprofen as needed for headaches, fevers, muscle aches.  Make sure your father is drinking plenty of water.  Most people experience a loss of appetite while they have active COVID.  He needs to stay hydrated with water however.

## 2022-06-26 NOTE — ED Triage Notes (Addendum)
Pt arrived POV from home c/o bilateral hip pain that radiates down his legs x3 days. Per family pt is moving slower than normal because of the pain but able to ambulate with no issues. They deny any injuries or falls.

## 2022-10-07 IMAGING — CR DG KNEE COMPLETE 4+V*L*
4 series · 4 of 4 positions shown · non-contrast
Comparison: July 05, 2015.

CLINICAL DATA: A 65-year-old male presents with LEFT anterior knee
pain, 2 days duration without history of injury.

EXAM:
LEFT KNEE - COMPLETE 4+ VIEW

[knee ap]
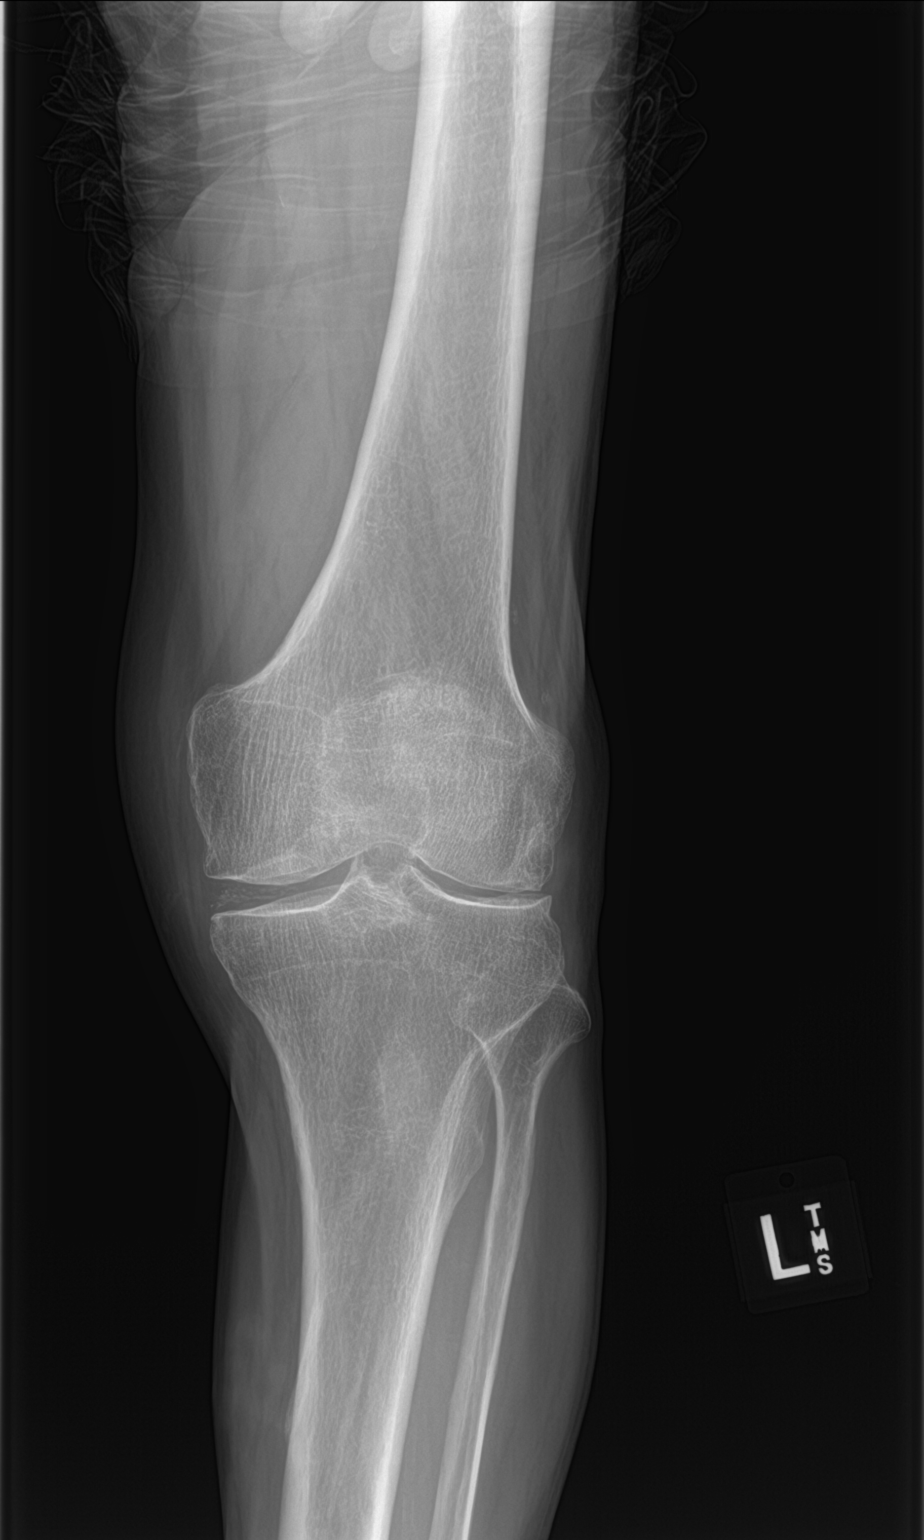

[knee obl (1 of 2)]
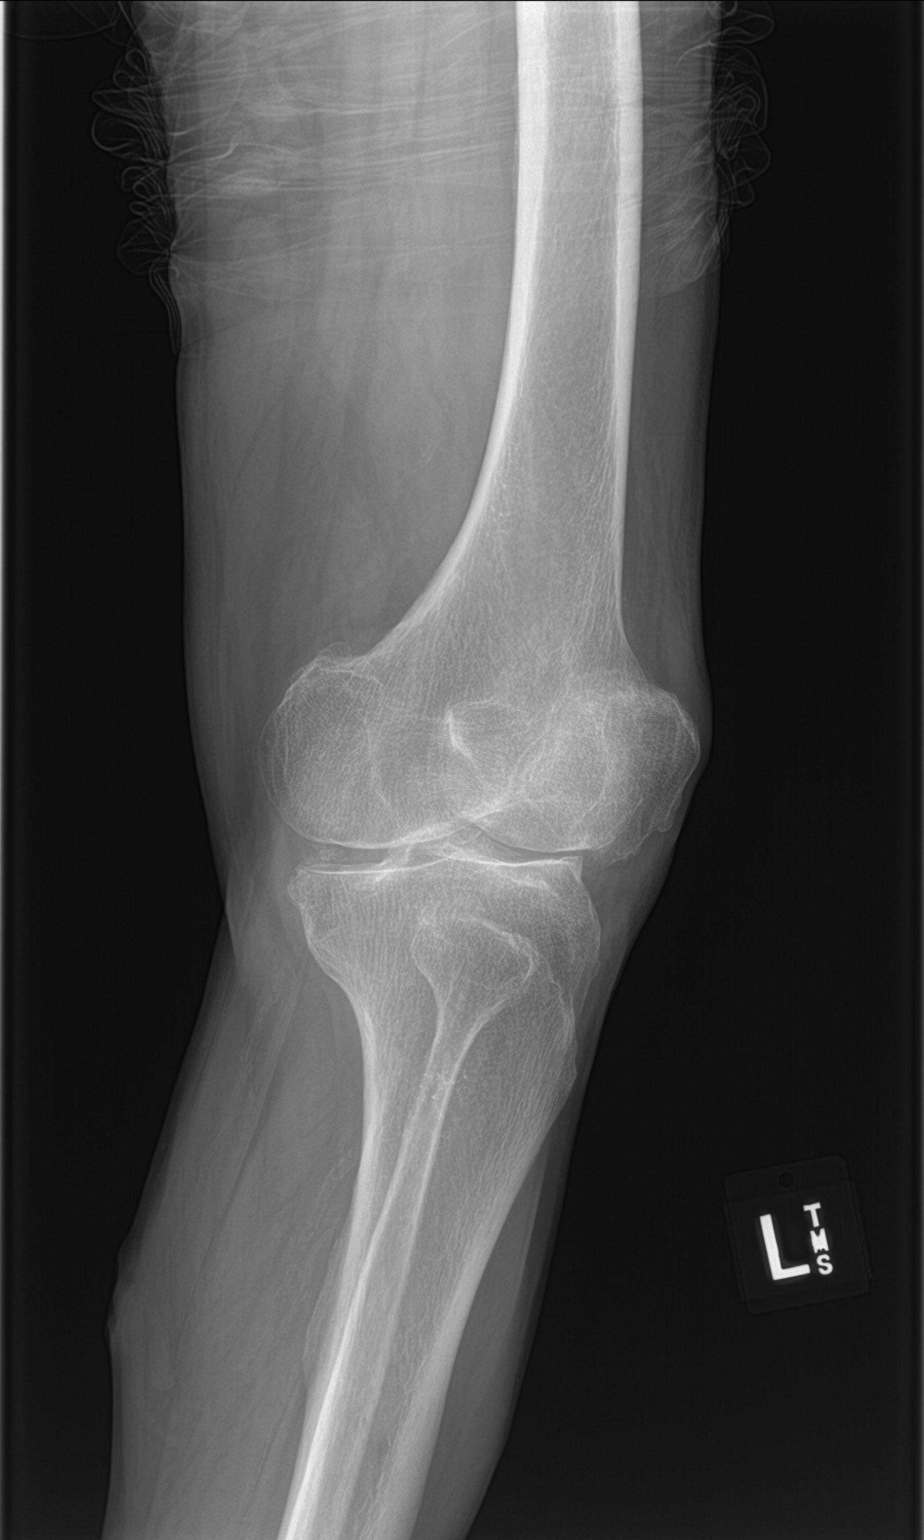

[knee obl (2 of 2)]
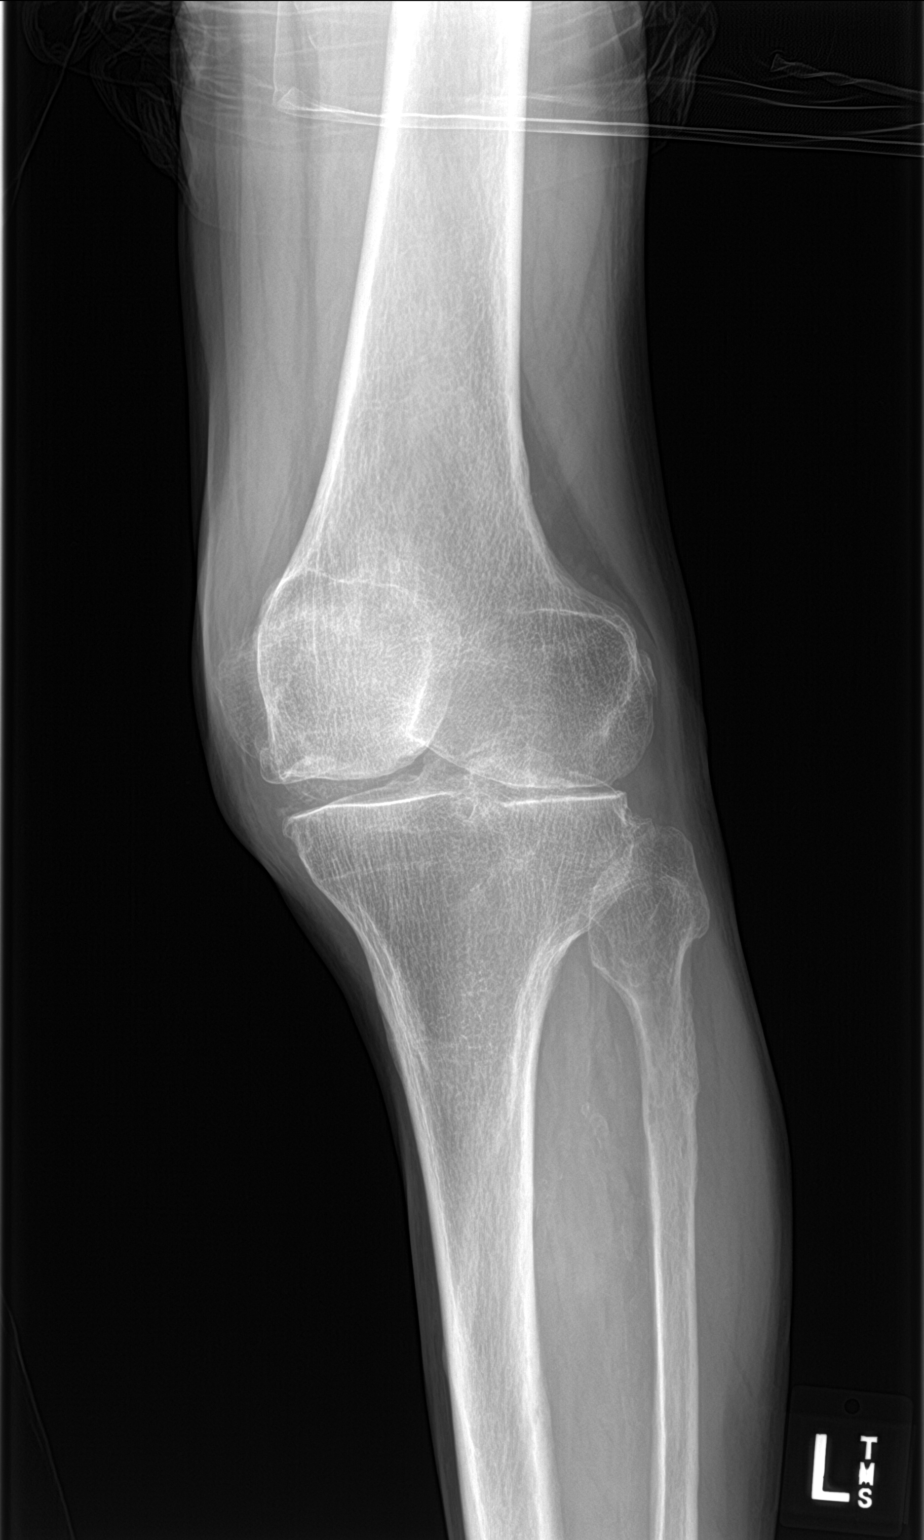

[knee lat]
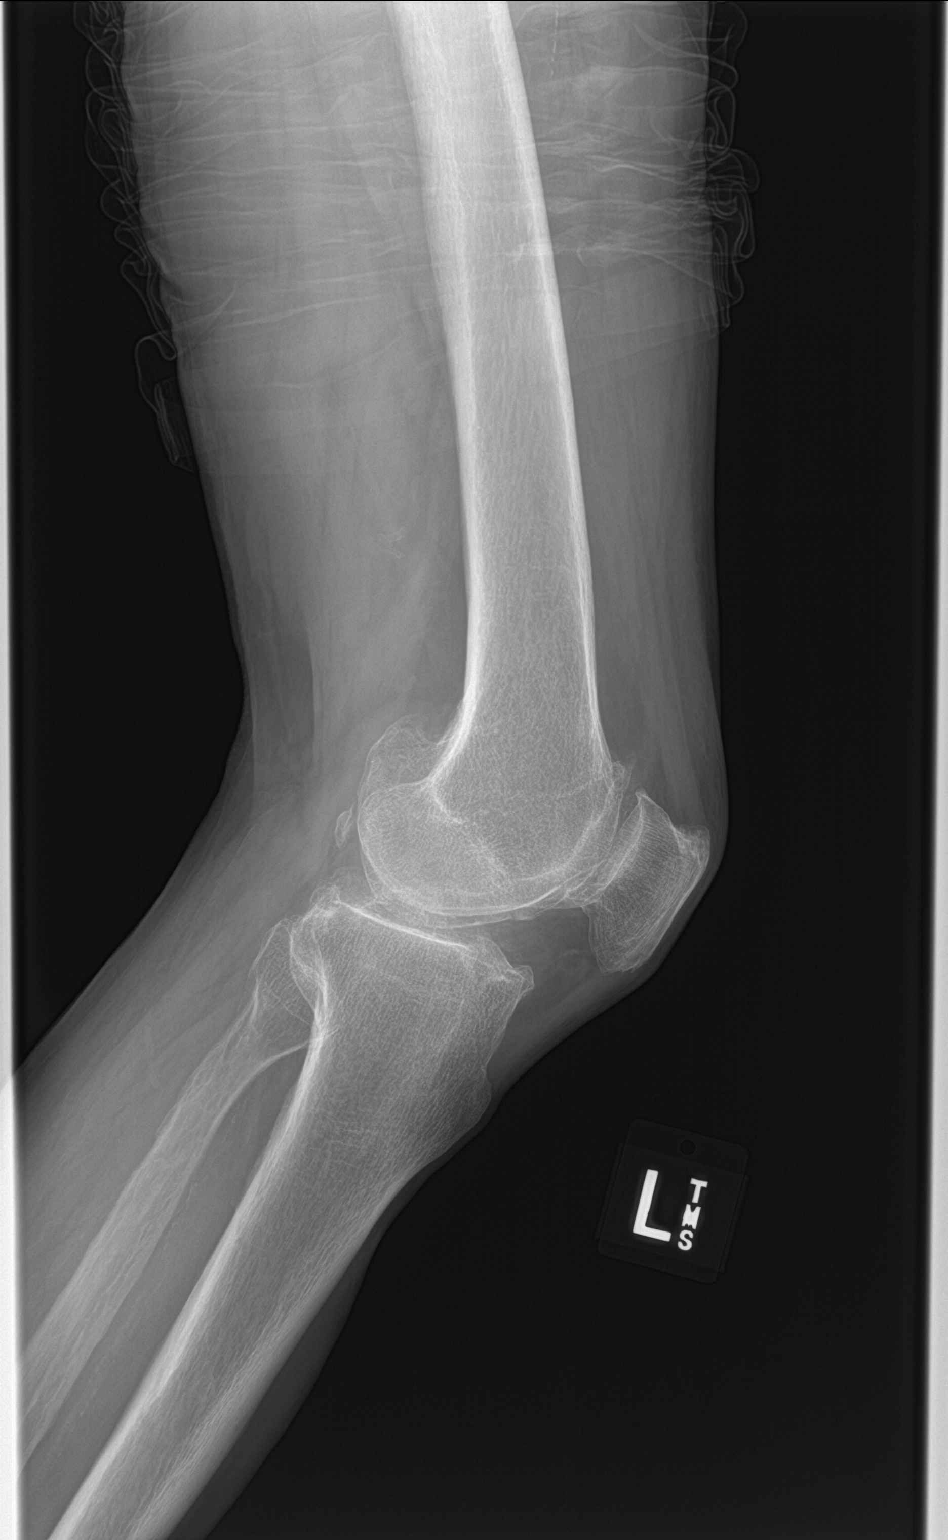

[4 of 4 positions shown; findings below may reference images not displayed]

FINDINGS: Signs of chondrocalcinosis about the LEFT medial compartment in
particular. Tricompartmental osteoarthritic changes.

No sign of acute fracture or dislocation. Tricompartmental
degenerative changes are worse in the patellofemoral and medial
compartments. No joint effusion.
IMPRESSION: 1. Tricompartmental osteoarthritic changes without acute osseous
abnormality.
2. Degenerative changes include chondrocalcinosis.

## 2023-07-21 ENCOUNTER — Ambulatory Visit: Payer: Self-pay | Admitting: Family Medicine

## 2023-08-28 ENCOUNTER — Encounter: Payer: Self-pay | Admitting: Family Medicine

## 2023-09-30 ENCOUNTER — Encounter: Payer: Self-pay | Admitting: Family Medicine

## 2023-10-10 ENCOUNTER — Encounter (HOSPITAL_COMMUNITY): Payer: Self-pay | Admitting: *Deleted

## 2023-10-10 ENCOUNTER — Emergency Department (HOSPITAL_COMMUNITY)
Admission: EM | Admit: 2023-10-10 | Discharge: 2023-10-10 | Disposition: A | Discharge status: Home / Self Care | Payer: Self-pay | Attending: Emergency Medicine | Admitting: Emergency Medicine

## 2023-10-10 ENCOUNTER — Other Ambulatory Visit: Payer: Self-pay

## 2023-10-10 DIAGNOSIS — F0390 Unspecified dementia without behavioral disturbance: Secondary | ICD-10-CM | POA: Insufficient documentation

## 2023-10-10 HISTORY — DX: Unspecified dementia, unspecified severity, without behavioral disturbance, psychotic disturbance, mood disturbance, and anxiety: F03.90

## 2023-10-10 HISTORY — DX: Essential (primary) hypertension: I10

## 2023-10-10 NOTE — ED Triage Notes (Signed)
 Pt found wandering around, could not state his name.  It appeared he has dementia.  Pt calm.  Daughter called 911 and was told that a pt matching that description was here at the hospital.  Daughter at bedside.  States it appears he left the house around 6 pm.  Pt denies any pain.  Daughter states that pt has dementia, but he has been more confused  than normal this last week.

## 2023-10-10 NOTE — Discharge Instructions (Signed)
 Please follow-up as needed with your primary care team.

## 2023-10-10 NOTE — ED Provider Notes (Signed)
 [place] EMERGENCY DEPARTMENT AT [facility] Provider Note   CSN: [id number] Arrival date & time: 10/10/23  2138     History  Chief Complaint  Patient presents with   Altered Mental Status    Thomas Ortiz is a 68 y.o. male.  Patient presents to the emergency department via EMS after being found wandering around outside.  The patient was unable to tell who he was.  After arrival patient's daughter called and stated that her father had wandered away from the house when she went to work this evening.  Patient's daughter arrived on scene and confirmed patient's identity.  Patient is reportedly at baseline.  The patient's daughter states she normally does not work at night and that the patient lives with her.  This evening she had to work on night shift and the patient apparently became more confused due to the change in routine and left the house.  The patient's son-in-law and grandchild were at the house but the son-in-law had fallen asleep and did not realize that the patient had wandered away initially.  Patient has no complaints at this time and is at baseline   Altered Mental Status      Home Medications Prior to Admission medications   Not on File      Allergies    Patient has no known allergies.    Review of Systems   Review of Systems  Physical Exam Updated Vital Signs BP (!) 124/91   Pulse 80   Temp 97.6 F (36.4 C) (Temporal)   Resp 16   Ht 5\' 5"  (1.651 m)   Wt 64.4 kg   SpO2 100%   BMI 23.63 kg/m  Physical Exam Vitals and nursing note reviewed.  HENT:     Head: Normocephalic and atraumatic.  Eyes:     Conjunctiva/sclera: Conjunctivae normal.  Pulmonary:     Effort: Pulmonary effort is normal. No respiratory distress.  Musculoskeletal:        General: No signs of injury.     Cervical back: Normal range of motion.  Skin:    General: Skin is dry.  Neurological:     Mental Status: He is alert. Mental status is at baseline.  Psychiatric:         Speech: Speech normal.        Behavior: Behavior normal.     ED Results / Procedures / Treatments   Labs (all labs ordered are listed, but only abnormal results are displayed) Labs Reviewed - No data to display   EKG None  Radiology No results found.  Procedures Procedures    Medications Ordered in ED Medications - No data to display  ED Course/ Medical Decision Making/ A&P                                 Medical Decision Making Amount and/or Complexity of Data Reviewed Labs: ordered. Radiology: ordered.   Patient found wandering by EMS as a Thomas Ortiz.  Patient unable to provide any information about himself at time of arrival.  Patient's daughter arrived at the hospital and confirmed patient's identity.  Patient is reportedly at his mental baseline with underlying dementia.  At this time there is no indication for any further emergent medical workup or admission.  Patient stable for discharge home with daughter.        Final Clinical Impression(s) / ED Diagnoses Final diagnoses:  Dementia, unspecified dementia  severity, unspecified dementia type, unspecified whether behavioral, psychotic, or mood disturbance or anxiety [facility])    Rx / DC Orders ED Discharge Orders     None         Thomas Ortiz 10/10/23 2257    Thomas Applebaum, MD 10/11/23 [phone]

## 2023-10-12 ENCOUNTER — Encounter (HOSPITAL_COMMUNITY): Payer: Self-pay

## 2023-10-29 ENCOUNTER — Encounter: Payer: Self-pay | Admitting: Family Medicine

## 2024-01-26 ENCOUNTER — Ambulatory Visit (INDEPENDENT_AMBULATORY_CARE_PROVIDER_SITE_OTHER): Payer: Self-pay | Admitting: Primary Care

## 2024-02-04 ENCOUNTER — Other Ambulatory Visit: Payer: Self-pay

## 2024-02-04 ENCOUNTER — Emergency Department (HOSPITAL_COMMUNITY)
Admission: EM | Admit: 2024-02-04 | Discharge: 2024-02-04 | Discharge status: Against Medical Advice | Payer: Self-pay | Attending: Emergency Medicine | Admitting: Emergency Medicine

## 2024-02-04 DIAGNOSIS — F0390 Unspecified dementia without behavioral disturbance: Secondary | ICD-10-CM | POA: Insufficient documentation

## 2024-02-04 DIAGNOSIS — R350 Frequency of micturition: Secondary | ICD-10-CM | POA: Insufficient documentation

## 2024-02-04 DIAGNOSIS — R413 Other amnesia: Secondary | ICD-10-CM | POA: Insufficient documentation

## 2024-02-04 DIAGNOSIS — Z5321 Procedure and treatment not carried out due to patient leaving prior to being seen by health care provider: Secondary | ICD-10-CM | POA: Insufficient documentation

## 2024-02-04 LAB — COMPREHENSIVE METABOLIC PANEL WITH GFR
ALT: 12 U/L (ref 0–44)
AST: 16 U/L (ref 15–41)
Albumin: 3.9 g/dL (ref 3.5–5.0)
Alkaline Phosphatase: 81 U/L (ref 38–126)
Anion gap: 9 (ref 5–15)
BUN: 16 mg/dL (ref 8–23)
CO2: 25 mmol/L (ref 22–32)
Calcium: 9.5 mg/dL (ref 8.9–10.3)
Chloride: 105 mmol/L (ref 98–111)
Creatinine, Ser: 1.12 mg/dL (ref 0.61–1.24)
GFR, Estimated: >60 mL/min (ref >60)
Glucose, Bld: 169 mg/dL — ABNORMAL HIGH (ref 70–99)
Potassium: 3.9 mmol/L (ref 3.5–5.1)
Sodium: 139 mmol/L (ref 135–145)
Total Bilirubin: 0.7 mg/dL (ref 0.0–1.2)
Total Protein: 7.5 g/dL (ref 6.5–8.1)

## 2024-02-04 LAB — CBC
HCT: 44.3 % (ref 39.0–52.0)
Hemoglobin: 14.7 g/dL (ref 13.0–17.0)
MCH: 32.2 pg (ref 26.0–34.0)
MCHC: 33.2 g/dL (ref 30.0–36.0)
MCV: 97.1 fL (ref 80.0–100.0)
Platelets: 270 K/uL (ref 150–400)
RBC: 4.56 MIL/uL (ref 4.22–5.81)
RDW: 13.4 % (ref 11.5–15.5)
WBC: 7.6 K/uL (ref 4.0–10.5)
nRBC: 0 % (ref 0.0–0.2)

## 2024-02-04 LAB — CBG MONITORING, ED: Glucose-Capillary: 160 mg/dL — ABNORMAL HIGH (ref 70–99)

## 2024-02-04 NOTE — ED Notes (Signed)
 Patient left.

## 2024-02-04 NOTE — ED Triage Notes (Signed)
 Pt has dementia and daughter feels like it is getting worse. Pt is losing memory and not recognizing people, wandering off, becoming aggressive. Daughter has noticed urinary frequency. No fevers or chills

## 2024-02-15 ENCOUNTER — Emergency Department (HOSPITAL_COMMUNITY)
Admission: EM | Admit: 2024-02-15 | Discharge: 2024-02-15 | Disposition: A | Discharge status: Home / Self Care | Payer: Self-pay | Attending: Emergency Medicine | Admitting: Emergency Medicine

## 2024-02-15 ENCOUNTER — Other Ambulatory Visit: Payer: Self-pay

## 2024-02-15 ENCOUNTER — Emergency Department (HOSPITAL_COMMUNITY): Payer: Self-pay

## 2024-02-15 ENCOUNTER — Encounter (HOSPITAL_COMMUNITY): Payer: Self-pay

## 2024-02-15 DIAGNOSIS — M25561 Pain in right knee: Secondary | ICD-10-CM | POA: Insufficient documentation

## 2024-02-15 DIAGNOSIS — M79652 Pain in left thigh: Secondary | ICD-10-CM | POA: Insufficient documentation

## 2024-02-15 DIAGNOSIS — R410 Disorientation, unspecified: Secondary | ICD-10-CM | POA: Insufficient documentation

## 2024-02-15 DIAGNOSIS — R300 Dysuria: Secondary | ICD-10-CM | POA: Insufficient documentation

## 2024-02-15 DIAGNOSIS — R1030 Lower abdominal pain, unspecified: Secondary | ICD-10-CM | POA: Insufficient documentation

## 2024-02-15 DIAGNOSIS — M79651 Pain in right thigh: Secondary | ICD-10-CM | POA: Insufficient documentation

## 2024-02-15 DIAGNOSIS — K5900 Constipation, unspecified: Secondary | ICD-10-CM | POA: Insufficient documentation

## 2024-02-15 DIAGNOSIS — Z1331 Encounter for screening for depression: Secondary | ICD-10-CM

## 2024-02-15 DIAGNOSIS — J328 Other chronic sinusitis: Secondary | ICD-10-CM

## 2024-02-15 DIAGNOSIS — I10 Essential (primary) hypertension: Secondary | ICD-10-CM | POA: Insufficient documentation

## 2024-02-15 DIAGNOSIS — F03911 Unspecified dementia, unspecified severity, with agitation: Secondary | ICD-10-CM | POA: Insufficient documentation

## 2024-02-15 LAB — URINALYSIS, W/ REFLEX TO CULTURE (INFECTION SUSPECTED)
Bacteria, UA: NONE SEEN
Bilirubin Urine: NEGATIVE
Glucose, UA: NEGATIVE mg/dL
Hgb urine dipstick: NEGATIVE
Ketones, ur: 5 mg/dL — AB
Leukocytes,Ua: NEGATIVE
Nitrite: NEGATIVE
Protein, ur: NEGATIVE mg/dL
Specific Gravity, Urine: 1.016 (ref 1.005–1.030)
pH: 5 (ref 5.0–8.0)

## 2024-02-15 LAB — CBC
HCT: 47.7 % (ref 39.0–52.0)
Hemoglobin: 15.8 g/dL (ref 13.0–17.0)
MCH: 32.1 pg (ref 26.0–34.0)
MCHC: 33.1 g/dL (ref 30.0–36.0)
MCV: 97 fL (ref 80.0–100.0)
Platelets: 274 K/uL (ref 150–400)
RBC: 4.92 MIL/uL (ref 4.22–5.81)
RDW: 13.5 % (ref 11.5–15.5)
WBC: 10.5 K/uL (ref 4.0–10.5)
nRBC: 0 % (ref 0.0–0.2)

## 2024-02-15 LAB — COMPREHENSIVE METABOLIC PANEL WITH GFR
ALT: 14 U/L (ref 0–44)
AST: 23 U/L (ref 15–41)
Albumin: 4.2 g/dL (ref 3.5–5.0)
Alkaline Phosphatase: 78 U/L (ref 38–126)
Anion gap: 14 (ref 5–15)
BUN: 20 mg/dL (ref 8–23)
CO2: 22 mmol/L (ref 22–32)
Calcium: 9.4 mg/dL (ref 8.9–10.3)
Chloride: 104 mmol/L (ref 98–111)
Creatinine, Ser: 1.38 mg/dL — ABNORMAL HIGH (ref 0.61–1.24)
GFR, Estimated: 55 mL/min — ABNORMAL LOW (ref >60)
Glucose, Bld: 97 mg/dL (ref 70–99)
Potassium: 3.6 mmol/L (ref 3.5–5.1)
Sodium: 140 mmol/L (ref 135–145)
Total Bilirubin: 1.1 mg/dL (ref 0.0–1.2)
Total Protein: 7.8 g/dL (ref 6.5–8.1)

## 2024-02-15 LAB — TSH: TSH: 2.032 u[IU]/mL (ref 0.350–4.500)

## 2024-02-15 LAB — MAGNESIUM: Magnesium: 2.1 mg/dL (ref 1.7–2.4)

## 2024-02-15 MED ORDER — MEMANTINE HCL 10 MG PO TABS: 10.0000 mg | ORAL_TABLET | Freq: Two times a day (BID) | ORAL | 11 refills | Status: AC

## 2024-02-15 MED ORDER — DICLOFENAC SODIUM 1 % EX GEL
2.0000 g | Freq: Four times a day (QID) | CUTANEOUS | 0 refills | Status: AC
  Filled 2024-02-15: qty 100, 12d supply, fill #0

## 2024-02-15 MED ORDER — ACETAMINOPHEN 325 MG PO TABS
650.0000 mg | ORAL_TABLET | Freq: Once | ORAL | Status: AC
  Administered 2024-02-15: 650 mg via ORAL
  Filled 2024-02-15: qty 2

## 2024-02-15 MED ORDER — QUETIAPINE FUMARATE 25 MG PO TABS: ORAL_TABLET | ORAL | 3 refills | Status: DC

## 2024-02-15 MED ORDER — SERTRALINE HCL 50 MG PO TABS
50.0000 mg | ORAL_TABLET | Freq: Every day | ORAL | 3 refills | Status: AC
  Filled 2024-02-15: qty 30, 30d supply, fill #0

## 2024-02-15 MED ORDER — QUETIAPINE FUMARATE 25 MG PO TABS: ORAL_TABLET | ORAL | 3 refills | Status: AC

## 2024-02-15 MED ORDER — MEMANTINE HCL 10 MG PO TABS: 10.0000 mg | ORAL_TABLET | Freq: Two times a day (BID) | ORAL | 11 refills | Status: DC

## 2024-02-15 MED ORDER — ROSUVASTATIN CALCIUM 40 MG PO TABS
ORAL_TABLET | Freq: Every day | ORAL | 1 refills | Status: AC
Start: 2024-02-15 — End: 2025-02-14
  Filled 2024-02-15: qty 30, 30d supply, fill #0

## 2024-02-15 MED ORDER — LACTATED RINGERS IV BOLUS
1000.0000 mL | Freq: Once | INTRAVENOUS | Status: AC
  Administered 2024-02-15: 1000 mL via INTRAVENOUS

## 2024-02-15 NOTE — Discharge Instructions (Addendum)
 Your x-ray did show significant arthritis.  Please use the voltaren  gel as needed as well as tylenol  650 mg every 6 hours as needed for pain.  Please come back tomorrow for your DVT ultrasound.  Return for fevers or worsening symptoms.   [employer]  [facility] Address: [address] [place], [place] [address] Phone: [phone]  [facility] Address: [address], [place], [place] [address] Phone: [phone]  [facility] Address: [address] [place], [place] [address] Phone: [phone]  [facility] Address: [address] [place], [place] [address] Phone: [phone]  [facility] Address: [address] [place], [place], [place] [address] Phone: [phone]  [facility] Phone: [phone]  [facility] Address:  [address], [place], [place] [address] Phone:  [phone]  [facility] Address:  [address] Phone:  [phone]  [email or web address]  [employer] [employer] Phone: [phone]

## 2024-02-15 NOTE — ED Notes (Signed)
Pt d/c home with daughter

## 2024-02-15 NOTE — ED Provider Notes (Signed)
 I received the patient in signout.  He was evaluated by social work and there were no home health options as the patient does not have insurance.  On reassessment the main complaint was for pain in the right leg.  This is mostly in the right knee.  Patient's x-ray that I ordered did show osteoarthritis which I suspect is the likely cause.  Did order an ultrasound as well to evaluate for DVT.  Suspicion for this is relatively low so we will hold off on any blood thinners at this time and he will be discharged to have this performed as an outpatient as we do not have vascular ultrasound at this time.  He is discharged with return precautions. Physical Exam  BP (!) 133/92   Pulse 63   Temp 98.6 F (37 C) (Oral)   Resp 18   Ht 5' 5 (1.651 m)   Wt 64.4 kg   SpO2 98%   BMI 23.63 kg/m   Physical Exam General: No acute distress MSK: The patient has normal range of motion of the knee and ankle on the right with no warmth, erythema, or joint effusion noted, there is some tenderness over the popliteal fossa but no calf tenderness Vascular: 2+ DP pulses bilaterally Procedures  Procedures  ED Course / MDM    Medical Decision Making Amount and/or Complexity of Data Reviewed Labs: ordered. Radiology: ordered.  Risk OTC drugs. Prescription drug management.          Ula Prentice SAUNDERS, MD 02/15/24 2115

## 2024-02-15 NOTE — ED Notes (Signed)
 Daughter states he seems more irritable than normal. She states she is currently living in a hotel until apartment is ready to move in. Pt has been wondering more and daughter is concerned dementia is getting worse. Pt u/t get regular medications d/t no insurance and financial concerns. Daughter is here to determine if he has an infection that may be causing him to act different or if it is his dementia. She is seeking for resources that can help him with the plan of pt staying in the household with her.

## 2024-02-15 NOTE — ED Provider Notes (Signed)
 [place] EMERGENCY DEPARTMENT AT [facility] Provider Note   CSN: [id number] Arrival date & time: 02/15/24  1314     Patient presents with: Leg Pain and Agitation   Thomas Ortiz is a 68 y.o. male.    Leg Pain Presents for multiple complaints.  He is accompanied in the ED by his daughter who provides history.  Per daughter, he was diagnosed with dementia in 2019.  She has been his sole caregiver.  She recently lost her home and they have been staying in a hotel.  She is in the process of moving into a new place and plans to do so this week.  Patient typically takes medication for hypertension and dementia.  He has ran out of his medications due to cost constraints.  Recently, he has had constipation, lower abdominal pain, dysuria, and bilateral thigh pain.  He has also had increased agitation, increased confusion.  Patient's daughter is not interested in pursuing nursing facility care.  She is interested in finding possible reversible causes of his recent behavioral changes and is interested in home health care resources.     Prior to Admission medications   Medication Sig Start Date End Date Taking? Authorizing Provider  fluticasone  (FLONASE ) 50 MCG/ACT nasal spray Place 2 sprays into both nostrils daily. Patient not taking: Reported on 07/11/2021 05/22/21   Newlin, Enobong, MD  gabapentin  (NEURONTIN ) 100 MG capsule Take 1 capsule (100 mg total) by mouth 3 (three) times daily as needed for up to 21 doses. 06/26/22   Cottie Donnice PARAS, MD  memantine  (NAMENDA ) 10 MG tablet Take 1 tablet (10 mg total) by mouth 2 (two) times daily. Patient not taking: Reported on 07/11/2021 11/08/20   Wertman, Sara E, PA-C  naproxen  (NAPROSYN ) 500 MG tablet Take 1 tablet (500 mg total) by mouth 2 (two) times daily as needed. Patient not taking: Reported on 06/26/2022 07/30/21   Schuyler Charlie RAMAN, MD  QUEtiapine  (SEROQUEL ) 25 MG tablet Take 1 tablet at night for agitation, paranoia Patient not taking:  Reported on 07/11/2021 11/08/20   Wertman, Sara E, PA-C  rosuvastatin  (CRESTOR ) 40 MG tablet TAKE 1 TABLET (40 MG TOTAL) BY MOUTH DAILY. Patient not taking: Reported on 06/26/2022 07/25/21 07/25/22  Newlin, Enobong, MD  sertraline  (ZOLOFT ) 50 MG tablet Take 1 tablet (50 mg total) by mouth daily. Patient not taking: Reported on 07/11/2021 04/10/21   Danton Jon HERO, PA-C    Allergies: Patient has no known allergies.    Review of Systems  Unable to perform ROS: Dementia    Updated Vital Signs BP 121/82 (BP Location: Left Arm)   Pulse 75   Temp 97.8 F (36.6 C)   Resp 18   Ht 5' 5 (1.651 m)   Wt 64.4 kg   SpO2 100%   BMI 23.63 kg/m   Physical Exam Vitals and nursing note reviewed.  Constitutional:      General: He is not in acute distress.    Appearance: Normal appearance. He is well-developed. He is not ill-appearing, toxic-appearing or diaphoretic.  HENT:     Head: Normocephalic and atraumatic.     Right Ear: External ear normal.     Left Ear: External ear normal.     Nose: Nose normal.     Mouth/Throat:     Mouth: Mucous membranes are moist.  Eyes:     Extraocular Movements: Extraocular movements intact.     Conjunctiva/sclera: Conjunctivae normal.  Cardiovascular:     Rate and Rhythm: Normal rate  and regular rhythm.  Pulmonary:     Effort: Pulmonary effort is normal. No respiratory distress.  Abdominal:     General: There is no distension.     Palpations: Abdomen is soft.     Tenderness: There is abdominal tenderness. There is no guarding or rebound.  Musculoskeletal:        General: No swelling or deformity.     Cervical back: Normal range of motion and neck supple.     Right lower leg: No edema.     Left lower leg: No edema.  Skin:    General: Skin is warm and dry.     Coloration: Skin is not jaundiced or pale.  Neurological:     General: No focal deficit present.     Mental Status: He is alert and oriented to person, place, and time.     Cranial Nerves: No  cranial nerve deficit.     Sensory: No sensory deficit.  Psychiatric:        Mood and Affect: Mood normal.        Behavior: Behavior normal.     (all labs ordered are listed, but only abnormal results are displayed) Labs Reviewed  CBC  COMPREHENSIVE METABOLIC PANEL WITH GFR  URINALYSIS, W/ REFLEX TO CULTURE (INFECTION SUSPECTED)  TSH  MAGNESIUM  CBG MONITORING, ED    EKG: None  Radiology: No results found.   Procedures   Medications Ordered in the ED - No data to display                                  Medical Decision Making Amount and/or Complexity of Data Reviewed Labs: ordered.   Patient presenting with daughter to the ED for multiple concerns.  Daughter reports recent constipation, dysuria, lower abdominal pain, bilateral thigh pain.  At baseline, he ambulates with a unsteady gait.  He has reportedly had dementia for the past 6 years.  He has had increased confusion and behavioral changes lately.  On arrival in the ED, patient is calm and cooperative.  History is provided entirely by daughter due to the patient's dementia and limited English.  He is overall well-appearing.  On inspection of his legs, there are no areas of swelling, masses, erythema, or wounds.  He does not appear to have any tenderness in his lower extremities.  He does have some lower abdominal tenderness.  He describes pain that radiates to scrotum scrotum is nonswollen.  Bladder scan shows BPH with small volume urine in bladder.  I do not suspect urine retention.  Will check urinalysis as well as CT scan of abdomen.  Face-to-face orders placed in anticipation of discharge today.  Lab work and urinalysis pending at time of signout.  Care of patient signed out to oncoming ED provider.     Final diagnoses:  Dementia with agitation, unspecified dementia severity, unspecified dementia type [facility])    ED Discharge Orders     None          Melvenia Motto, MD 02/15/24 1458

## 2024-02-15 NOTE — ED Triage Notes (Signed)
 Pt bib POV with daughter c/o dementia getting worse. Pt currently doesn't have insurance and not taking regular medications as needed d/t financial situation.   Pt c/o of bilateral leg pain that started yesterday.   Pt daughter is interested in resources to better assist dad.  Daughter states pt has been wondering around as well.   Interested in speaking with CSW

## 2024-02-15 NOTE — Care Management (Signed)
 MATCH Medication Assistance Card *Pharmacies please call [phone] for claim processing assistance Name: Thomas Ortiz                                                                                                                                                                                        Relationship Code:  1 ID (MRN): [id number]                                                                                                                                                                                  Person Code:  [id number] Bin: [id number] RX Group: [id number] Discharge Date: 02/15/2024                                 RX PCN:  [id number] Expiration Date:02/29/2024                                           (must be filled within 7 days of discharge)     You have been approved to have the prescriptions written by your discharging physician filled through our [facility] (Medication Assistance Through [facility]) program. This program allows for a one-time (no refills) 34-day supply of selected medications for a low copay amount.  The copay is $0 per prescription.   Only certain pharmacies are participating in this program with [facility]. You will need to select one of the pharmacies from the attached list and take your prescriptions, this letter, and your photo ID to one of  the [facility] Outpatient pharmacies.  We are excited that you are able to use the [facility] program to get your medications. These prescriptions must be filled  within 7 days of hospital discharge or they will no longer be valid for the [facility] program. Should you have any problems with your prescriptions please contact your case management team member at [phone] for [place]/ [facility].  Thank you,   [facility]

## 2024-02-15 NOTE — ED Notes (Signed)
 Pt unable to provider

## 2024-02-16 ENCOUNTER — Other Ambulatory Visit (HOSPITAL_COMMUNITY): Payer: Self-pay

## 2024-02-17 ENCOUNTER — Ambulatory Visit (HOSPITAL_COMMUNITY): Payer: Self-pay
